# Patient Record
Sex: Female | Born: 1935
Health system: Southern US, Community
[De-identification: ages and names within clinical notes are randomized; demographics above are authoritative.]

## PROBLEM LIST (undated history)

## (undated) DIAGNOSIS — K859 Acute pancreatitis without necrosis or infection, unspecified: Secondary | ICD-10-CM

## (undated) DIAGNOSIS — I10 Essential (primary) hypertension: Secondary | ICD-10-CM

## (undated) DIAGNOSIS — M069 Rheumatoid arthritis, unspecified: Secondary | ICD-10-CM

## (undated) DIAGNOSIS — K5730 Diverticulosis of large intestine without perforation or abscess without bleeding: Secondary | ICD-10-CM

## (undated) DIAGNOSIS — R413 Other amnesia: Secondary | ICD-10-CM

## (undated) DIAGNOSIS — R198 Other specified symptoms and signs involving the digestive system and abdomen: Secondary | ICD-10-CM

## (undated) DIAGNOSIS — K222 Esophageal obstruction: Secondary | ICD-10-CM

## (undated) DIAGNOSIS — G4700 Insomnia, unspecified: Secondary | ICD-10-CM

## (undated) DIAGNOSIS — K219 Gastro-esophageal reflux disease without esophagitis: Secondary | ICD-10-CM

## (undated) DIAGNOSIS — K8020 Calculus of gallbladder without cholecystitis without obstruction: Secondary | ICD-10-CM

## (undated) DIAGNOSIS — E785 Hyperlipidemia, unspecified: Secondary | ICD-10-CM

## (undated) DIAGNOSIS — Z86010 Personal history of colonic polyps: Secondary | ICD-10-CM

## (undated) DIAGNOSIS — E538 Deficiency of other specified B group vitamins: Secondary | ICD-10-CM

## (undated) DIAGNOSIS — I2510 Atherosclerotic heart disease of native coronary artery without angina pectoris: Secondary | ICD-10-CM

## (undated) DIAGNOSIS — D649 Anemia, unspecified: Secondary | ICD-10-CM

## (undated) DIAGNOSIS — M810 Age-related osteoporosis without current pathological fracture: Secondary | ICD-10-CM

## (undated) DIAGNOSIS — K449 Diaphragmatic hernia without obstruction or gangrene: Secondary | ICD-10-CM

## (undated) DIAGNOSIS — I219 Acute myocardial infarction, unspecified: Secondary | ICD-10-CM

## (undated) HISTORY — DX: Rheumatoid arthritis, unspecified: M06.9

## (undated) HISTORY — DX: Diverticulosis of large intestine without perforation or abscess without bleeding: K57.30

## (undated) HISTORY — DX: Hyperlipidemia, unspecified: E78.5

## (undated) HISTORY — DX: Essential (primary) hypertension: I10

## (undated) HISTORY — DX: Deficiency of other specified B group vitamins: E53.8

## (undated) HISTORY — PX: CHOLECYSTECTOMY: SHX55

## (undated) HISTORY — DX: Calculus of gallbladder without cholecystitis without obstruction: K80.20

## (undated) HISTORY — DX: Esophageal obstruction: K22.2

## (undated) HISTORY — DX: Other amnesia: R41.3

## (undated) HISTORY — DX: Diaphragmatic hernia without obstruction or gangrene: K44.9

## (undated) HISTORY — DX: Anemia, unspecified: D64.9

## (undated) HISTORY — DX: Other specified symptoms and signs involving the digestive system and abdomen: R19.8

## (undated) HISTORY — DX: Age-related osteoporosis without current pathological fracture: M81.0

## (undated) HISTORY — DX: Gastro-esophageal reflux disease without esophagitis: K21.9

## (undated) HISTORY — PX: ANGIOPLASTY: SHX39

## (undated) HISTORY — PX: AXILLARY SURGERY: SHX892

## (undated) HISTORY — PX: APPENDECTOMY: SHX54

## (undated) HISTORY — DX: Acute myocardial infarction, unspecified: I21.9

## (undated) HISTORY — DX: Atherosclerotic heart disease of native coronary artery without angina pectoris: I25.10

## (undated) HISTORY — DX: Insomnia, unspecified: G47.00

## (undated) HISTORY — DX: Acute pancreatitis without necrosis or infection, unspecified: K85.90

## (undated) HISTORY — DX: Personal history of colonic polyps: Z86.010

---

## 1987-06-16 DIAGNOSIS — I219 Acute myocardial infarction, unspecified: Secondary | ICD-10-CM

## 1987-06-16 HISTORY — DX: Acute myocardial infarction, unspecified: I21.9

## 1997-11-07 ENCOUNTER — Ambulatory Visit (HOSPITAL_COMMUNITY): Admission: RE | Admit: 1997-11-07 | Discharge: 1997-11-07 | Payer: Self-pay | Admitting: Podiatry

## 1998-01-01 ENCOUNTER — Ambulatory Visit (HOSPITAL_COMMUNITY): Admission: RE | Admit: 1998-01-01 | Discharge: 1998-01-01 | Payer: Self-pay | Admitting: Podiatry

## 1999-06-11 ENCOUNTER — Emergency Department (HOSPITAL_COMMUNITY): Admission: EM | Admit: 1999-06-11 | Discharge: 1999-06-11 | Payer: Self-pay | Admitting: Emergency Medicine

## 1999-06-11 ENCOUNTER — Encounter: Payer: Self-pay | Admitting: Emergency Medicine

## 1999-06-27 ENCOUNTER — Encounter: Payer: Self-pay | Admitting: Internal Medicine

## 1999-06-27 ENCOUNTER — Ambulatory Visit (HOSPITAL_COMMUNITY): Admission: RE | Admit: 1999-06-27 | Discharge: 1999-06-27 | Payer: Self-pay | Admitting: Internal Medicine

## 2000-06-11 ENCOUNTER — Emergency Department (HOSPITAL_COMMUNITY): Admission: EM | Admit: 2000-06-11 | Discharge: 2000-06-11 | Payer: Self-pay | Admitting: Emergency Medicine

## 2000-07-02 ENCOUNTER — Other Ambulatory Visit: Admission: RE | Admit: 2000-07-02 | Discharge: 2000-07-02 | Payer: Self-pay | Admitting: Gastroenterology

## 2001-12-05 ENCOUNTER — Encounter: Payer: Self-pay | Admitting: Internal Medicine

## 2001-12-05 ENCOUNTER — Inpatient Hospital Stay (HOSPITAL_COMMUNITY): Admission: EM | Admit: 2001-12-05 | Discharge: 2001-12-08 | Payer: Self-pay | Admitting: Emergency Medicine

## 2002-03-18 ENCOUNTER — Emergency Department (HOSPITAL_COMMUNITY): Admission: EM | Admit: 2002-03-18 | Discharge: 2002-03-18 | Payer: Self-pay | Admitting: Emergency Medicine

## 2002-03-18 ENCOUNTER — Encounter: Payer: Self-pay | Admitting: Emergency Medicine

## 2002-12-30 ENCOUNTER — Emergency Department (HOSPITAL_COMMUNITY): Admission: EM | Admit: 2002-12-30 | Discharge: 2002-12-30 | Payer: Self-pay | Admitting: Emergency Medicine

## 2003-01-05 ENCOUNTER — Encounter: Payer: Self-pay | Admitting: Radiology

## 2003-01-05 ENCOUNTER — Encounter: Admission: RE | Admit: 2003-01-05 | Discharge: 2003-01-05 | Payer: Self-pay | Admitting: *Deleted

## 2003-01-05 ENCOUNTER — Encounter: Payer: Self-pay | Admitting: *Deleted

## 2003-08-28 ENCOUNTER — Encounter: Admission: RE | Admit: 2003-08-28 | Discharge: 2003-10-02 | Payer: Self-pay | Admitting: Internal Medicine

## 2003-12-19 ENCOUNTER — Inpatient Hospital Stay (HOSPITAL_COMMUNITY): Admission: EM | Admit: 2003-12-19 | Discharge: 2003-12-21 | Payer: Self-pay | Admitting: Emergency Medicine

## 2004-01-22 ENCOUNTER — Encounter: Payer: Self-pay | Admitting: Gastroenterology

## 2004-05-22 ENCOUNTER — Ambulatory Visit: Payer: Self-pay | Admitting: Internal Medicine

## 2004-06-17 ENCOUNTER — Ambulatory Visit: Payer: Self-pay | Admitting: Internal Medicine

## 2004-07-29 ENCOUNTER — Ambulatory Visit: Payer: Self-pay | Admitting: Internal Medicine

## 2004-09-23 ENCOUNTER — Ambulatory Visit: Payer: Self-pay | Admitting: Internal Medicine

## 2004-10-09 ENCOUNTER — Ambulatory Visit: Payer: Self-pay | Admitting: Internal Medicine

## 2004-11-01 ENCOUNTER — Emergency Department (HOSPITAL_COMMUNITY): Admission: EM | Admit: 2004-11-01 | Discharge: 2004-11-02 | Payer: Self-pay | Admitting: Emergency Medicine

## 2004-11-05 ENCOUNTER — Ambulatory Visit: Payer: Self-pay | Admitting: Internal Medicine

## 2004-11-06 ENCOUNTER — Encounter: Admission: RE | Admit: 2004-11-06 | Discharge: 2004-11-06 | Payer: Self-pay | Admitting: Internal Medicine

## 2004-11-06 ENCOUNTER — Ambulatory Visit: Payer: Self-pay | Admitting: Gastroenterology

## 2004-11-07 ENCOUNTER — Ambulatory Visit: Payer: Self-pay | Admitting: Internal Medicine

## 2004-11-11 ENCOUNTER — Ambulatory Visit: Payer: Self-pay | Admitting: Gastroenterology

## 2004-11-24 ENCOUNTER — Ambulatory Visit: Payer: Self-pay | Admitting: Cardiology

## 2004-11-26 ENCOUNTER — Ambulatory Visit: Payer: Self-pay | Admitting: Gastroenterology

## 2004-12-10 ENCOUNTER — Ambulatory Visit: Payer: Self-pay | Admitting: Internal Medicine

## 2004-12-10 ENCOUNTER — Ambulatory Visit: Payer: Self-pay | Admitting: Oncology

## 2005-01-26 ENCOUNTER — Ambulatory Visit: Payer: Self-pay | Admitting: Oncology

## 2005-04-10 ENCOUNTER — Ambulatory Visit: Payer: Self-pay | Admitting: Internal Medicine

## 2005-04-13 ENCOUNTER — Ambulatory Visit: Payer: Self-pay | Admitting: Oncology

## 2005-04-21 ENCOUNTER — Encounter: Admission: RE | Admit: 2005-04-21 | Discharge: 2005-04-21 | Payer: Self-pay | Admitting: General Surgery

## 2005-04-21 ENCOUNTER — Encounter (INDEPENDENT_AMBULATORY_CARE_PROVIDER_SITE_OTHER): Payer: Self-pay | Admitting: Specialist

## 2005-05-05 ENCOUNTER — Ambulatory Visit: Payer: Self-pay | Admitting: Internal Medicine

## 2005-06-03 ENCOUNTER — Encounter (INDEPENDENT_AMBULATORY_CARE_PROVIDER_SITE_OTHER): Payer: Self-pay | Admitting: *Deleted

## 2005-06-03 ENCOUNTER — Ambulatory Visit (HOSPITAL_COMMUNITY): Admission: RE | Admit: 2005-06-03 | Discharge: 2005-06-04 | Payer: Self-pay | Admitting: General Surgery

## 2005-06-09 ENCOUNTER — Ambulatory Visit: Payer: Self-pay | Admitting: Oncology

## 2005-09-08 ENCOUNTER — Ambulatory Visit: Payer: Self-pay | Admitting: Oncology

## 2005-09-14 ENCOUNTER — Ambulatory Visit: Payer: Self-pay | Admitting: Internal Medicine

## 2005-12-31 ENCOUNTER — Ambulatory Visit: Payer: Self-pay | Admitting: Oncology

## 2006-01-05 LAB — CBC WITH DIFFERENTIAL/PLATELET
BASO%: 1.2 % (ref 0.0–2.0)
Basophils Absolute: 0.1 10*3/uL (ref 0.0–0.1)
EOS%: 4.6 % (ref 0.0–7.0)
Eosinophils Absolute: 0.2 10*3/uL (ref 0.0–0.5)
HCT: 39.9 % (ref 34.8–46.6)
HGB: 13 g/dL (ref 11.6–15.9)
LYMPH%: 46 % (ref 14.0–48.0)
MCH: 29.4 pg (ref 26.0–34.0)
MCHC: 32.6 g/dL (ref 32.0–36.0)
MCV: 90.2 fL (ref 81.0–101.0)
MONO#: 0.7 10*3/uL (ref 0.1–0.9)
MONO%: 15.1 % — ABNORMAL HIGH (ref 0.0–13.0)
NEUT#: 1.6 10*3/uL (ref 1.5–6.5)
NEUT%: 33.1 % — ABNORMAL LOW (ref 39.6–76.8)
Platelets: ADEQUATE 10*3/uL (ref 145–400)
RBC: 4.42 10*6/uL (ref 3.70–5.32)
RDW: 13 % (ref 11.3–14.5)
WBC: 4.9 10*3/uL (ref 3.9–10.0)
lymph#: 2.3 10*3/uL (ref 0.9–3.3)

## 2006-01-12 ENCOUNTER — Ambulatory Visit: Payer: Self-pay | Admitting: Gastroenterology

## 2006-02-02 LAB — CBC WITH DIFFERENTIAL/PLATELET
BASO%: 0.6 % (ref 0.0–2.0)
Basophils Absolute: 0 10*3/uL (ref 0.0–0.1)
EOS%: 2.6 % (ref 0.0–7.0)
Eosinophils Absolute: 0.1 10*3/uL (ref 0.0–0.5)
HCT: 39.6 % (ref 34.8–46.6)
HGB: 13 g/dL (ref 11.6–15.9)
LYMPH%: 38.6 % (ref 14.0–48.0)
MCH: 29.2 pg (ref 26.0–34.0)
MCHC: 32.8 g/dL (ref 32.0–36.0)
MCV: 88.9 fL (ref 81.0–101.0)
MONO#: 0.6 10*3/uL (ref 0.1–0.9)
MONO%: 13.9 % — ABNORMAL HIGH (ref 0.0–13.0)
NEUT#: 2 10*3/uL (ref 1.5–6.5)
NEUT%: 44.3 % (ref 39.6–76.8)
Platelets: 179 10*3/uL (ref 145–400)
RBC: 4.46 10*6/uL (ref 3.70–5.32)
RDW: 14 % (ref 11.3–14.5)
WBC: 4.5 10*3/uL (ref 3.9–10.0)
lymph#: 1.7 10*3/uL (ref 0.9–3.3)

## 2006-02-03 ENCOUNTER — Ambulatory Visit: Payer: Self-pay | Admitting: Gastroenterology

## 2006-03-19 ENCOUNTER — Ambulatory Visit: Payer: Self-pay | Admitting: Internal Medicine

## 2006-05-14 ENCOUNTER — Ambulatory Visit: Payer: Self-pay | Admitting: Internal Medicine

## 2006-05-15 HISTORY — PX: BREAST BIOPSY: SHX20

## 2006-05-25 ENCOUNTER — Encounter: Admission: RE | Admit: 2006-05-25 | Discharge: 2006-05-25 | Payer: Self-pay | Admitting: Internal Medicine

## 2006-05-28 ENCOUNTER — Encounter: Admission: RE | Admit: 2006-05-28 | Discharge: 2006-05-28 | Payer: Self-pay | Admitting: Orthopedic Surgery

## 2006-06-11 ENCOUNTER — Encounter: Admission: RE | Admit: 2006-06-11 | Discharge: 2006-06-11 | Payer: Self-pay | Admitting: Internal Medicine

## 2006-06-11 ENCOUNTER — Encounter (INDEPENDENT_AMBULATORY_CARE_PROVIDER_SITE_OTHER): Payer: Self-pay | Admitting: Specialist

## 2006-07-14 DIAGNOSIS — I2510 Atherosclerotic heart disease of native coronary artery without angina pectoris: Secondary | ICD-10-CM

## 2006-07-14 HISTORY — DX: Atherosclerotic heart disease of native coronary artery without angina pectoris: I25.10

## 2006-08-11 ENCOUNTER — Ambulatory Visit: Payer: Self-pay | Admitting: Oncology

## 2006-08-16 LAB — CBC WITH DIFFERENTIAL/PLATELET
BASO%: 0.9 % (ref 0.0–2.0)
Basophils Absolute: 0 10*3/uL (ref 0.0–0.1)
EOS%: 2.7 % (ref 0.0–7.0)
Eosinophils Absolute: 0.1 10*3/uL (ref 0.0–0.5)
HCT: 39.4 % (ref 34.8–46.6)
HGB: 12.8 g/dL (ref 11.6–15.9)
LYMPH%: 43.3 % (ref 14.0–48.0)
MCH: 29.4 pg (ref 26.0–34.0)
MCHC: 32.5 g/dL (ref 32.0–36.0)
MCV: 90.3 fL (ref 81.0–101.0)
MONO#: 0.6 10*3/uL (ref 0.1–0.9)
MONO%: 13.3 % — ABNORMAL HIGH (ref 0.0–13.0)
NEUT#: 1.8 10*3/uL (ref 1.5–6.5)
NEUT%: 39.8 % (ref 39.6–76.8)
Platelets: 171 10*3/uL (ref 145–400)
RBC: 4.36 10*6/uL (ref 3.70–5.32)
RDW: 15 % — ABNORMAL HIGH (ref 11.3–14.5)
WBC: 4.7 10*3/uL (ref 3.9–10.0)
lymph#: 2 10*3/uL (ref 0.9–3.3)

## 2007-02-10 ENCOUNTER — Ambulatory Visit: Payer: Self-pay | Admitting: Oncology

## 2007-03-10 LAB — CBC WITH DIFFERENTIAL/PLATELET
BASO%: 1.1 % (ref 0.0–2.0)
Basophils Absolute: 0 10*3/uL (ref 0.0–0.1)
EOS%: 1.6 % (ref 0.0–7.0)
Eosinophils Absolute: 0.1 10*3/uL (ref 0.0–0.5)
HCT: 40 % (ref 34.8–46.6)
HGB: 13.4 g/dL (ref 11.6–15.9)
LYMPH%: 45.2 % (ref 14.0–48.0)
MCH: 30.7 pg (ref 26.0–34.0)
MCHC: 33.6 g/dL (ref 32.0–36.0)
MCV: 91.5 fL (ref 81.0–101.0)
MONO#: 0.6 10*3/uL (ref 0.1–0.9)
MONO%: 12.3 % (ref 0.0–13.0)
NEUT#: 1.8 10*3/uL (ref 1.5–6.5)
NEUT%: 39.8 % (ref 39.6–76.8)
Platelets: 155 10*3/uL (ref 145–400)
RBC: 4.37 10*6/uL (ref 3.70–5.32)
RDW: 13.5 % (ref 11.3–14.5)
WBC: 4.5 10*3/uL (ref 3.9–10.0)
lymph#: 2 10*3/uL (ref 0.9–3.3)

## 2007-03-24 ENCOUNTER — Ambulatory Visit: Payer: Self-pay | Admitting: Internal Medicine

## 2007-05-13 ENCOUNTER — Ambulatory Visit: Payer: Self-pay | Admitting: Internal Medicine

## 2007-05-13 ENCOUNTER — Encounter: Payer: Self-pay | Admitting: Internal Medicine

## 2007-05-13 DIAGNOSIS — Z9079 Acquired absence of other genital organ(s): Secondary | ICD-10-CM | POA: Insufficient documentation

## 2007-05-13 DIAGNOSIS — Z8719 Personal history of other diseases of the digestive system: Secondary | ICD-10-CM | POA: Insufficient documentation

## 2007-05-13 DIAGNOSIS — Z860100 Personal history of colon polyps, unspecified: Secondary | ICD-10-CM | POA: Insufficient documentation

## 2007-05-13 DIAGNOSIS — Z9189 Other specified personal risk factors, not elsewhere classified: Secondary | ICD-10-CM | POA: Insufficient documentation

## 2007-05-13 DIAGNOSIS — I10 Essential (primary) hypertension: Secondary | ICD-10-CM | POA: Insufficient documentation

## 2007-05-13 DIAGNOSIS — G4700 Insomnia, unspecified: Secondary | ICD-10-CM | POA: Insufficient documentation

## 2007-05-13 DIAGNOSIS — I2510 Atherosclerotic heart disease of native coronary artery without angina pectoris: Secondary | ICD-10-CM | POA: Insufficient documentation

## 2007-05-13 DIAGNOSIS — M069 Rheumatoid arthritis, unspecified: Secondary | ICD-10-CM | POA: Insufficient documentation

## 2007-05-13 DIAGNOSIS — E785 Hyperlipidemia, unspecified: Secondary | ICD-10-CM | POA: Insufficient documentation

## 2007-06-15 ENCOUNTER — Encounter: Admission: RE | Admit: 2007-06-15 | Discharge: 2007-06-15 | Payer: Self-pay | Admitting: Internal Medicine

## 2007-06-15 ENCOUNTER — Encounter: Payer: Self-pay | Admitting: Internal Medicine

## 2007-08-04 ENCOUNTER — Encounter: Payer: Self-pay | Admitting: Internal Medicine

## 2007-08-17 ENCOUNTER — Encounter: Payer: Self-pay | Admitting: Internal Medicine

## 2007-08-18 ENCOUNTER — Ambulatory Visit: Payer: Self-pay | Admitting: Internal Medicine

## 2007-08-24 ENCOUNTER — Telehealth: Payer: Self-pay | Admitting: Internal Medicine

## 2007-09-06 ENCOUNTER — Ambulatory Visit: Payer: Self-pay | Admitting: Oncology

## 2007-09-08 ENCOUNTER — Encounter: Payer: Self-pay | Admitting: Internal Medicine

## 2007-11-22 ENCOUNTER — Encounter: Payer: Self-pay | Admitting: Internal Medicine

## 2008-02-24 ENCOUNTER — Ambulatory Visit: Payer: Self-pay | Admitting: Oncology

## 2008-02-28 ENCOUNTER — Encounter: Payer: Self-pay | Admitting: Internal Medicine

## 2008-02-28 ENCOUNTER — Encounter: Payer: Self-pay | Admitting: Gastroenterology

## 2008-02-28 LAB — CBC WITH DIFFERENTIAL/PLATELET
BASO%: 0.6 % (ref 0.0–2.0)
Basophils Absolute: 0 10*3/uL (ref 0.0–0.1)
EOS%: 2.2 % (ref 0.0–7.0)
Eosinophils Absolute: 0.1 10*3/uL (ref 0.0–0.5)
HCT: 40.8 % (ref 34.8–46.6)
HGB: 13.2 g/dL (ref 11.6–15.9)
LYMPH%: 42 % (ref 14.0–48.0)
MCH: 29.8 pg (ref 26.0–34.0)
MCHC: 32.3 g/dL (ref 32.0–36.0)
MCV: 92.3 fL (ref 81.0–101.0)
MONO#: 0.6 10*3/uL (ref 0.1–0.9)
MONO%: 11.8 % (ref 0.0–13.0)
NEUT#: 2.1 10*3/uL (ref 1.5–6.5)
NEUT%: 43.4 % (ref 39.6–76.8)
Platelets: 153 10*3/uL (ref 145–400)
RBC: 4.42 10*6/uL (ref 3.70–5.32)
RDW: 13.8 % (ref 11.3–14.5)
WBC: 4.8 10*3/uL (ref 3.9–10.0)
lymph#: 2 10*3/uL (ref 0.9–3.3)

## 2008-03-08 ENCOUNTER — Ambulatory Visit: Payer: Self-pay | Admitting: Internal Medicine

## 2008-04-19 ENCOUNTER — Encounter: Payer: Self-pay | Admitting: Internal Medicine

## 2008-05-01 ENCOUNTER — Encounter: Payer: Self-pay | Admitting: Internal Medicine

## 2008-05-07 ENCOUNTER — Encounter: Admission: RE | Admit: 2008-05-07 | Discharge: 2008-05-07 | Payer: Self-pay | Admitting: Internal Medicine

## 2008-06-18 ENCOUNTER — Encounter: Admission: RE | Admit: 2008-06-18 | Discharge: 2008-06-18 | Payer: Self-pay | Admitting: Internal Medicine

## 2008-06-20 ENCOUNTER — Encounter: Payer: Self-pay | Admitting: Internal Medicine

## 2008-07-27 ENCOUNTER — Encounter: Payer: Self-pay | Admitting: Internal Medicine

## 2008-07-31 ENCOUNTER — Ambulatory Visit: Payer: Self-pay | Admitting: Oncology

## 2008-07-31 ENCOUNTER — Encounter: Payer: Self-pay | Admitting: Gastroenterology

## 2008-07-31 ENCOUNTER — Encounter: Payer: Self-pay | Admitting: Internal Medicine

## 2008-07-31 LAB — CBC WITH DIFFERENTIAL/PLATELET
BASO%: 0.2 % (ref 0.0–2.0)
Basophils Absolute: 0 10*3/uL (ref 0.0–0.1)
EOS%: 0.1 % (ref 0.0–7.0)
Eosinophils Absolute: 0 10*3/uL (ref 0.0–0.5)
HCT: 42 % (ref 34.8–46.6)
HGB: 13.6 g/dL (ref 11.6–15.9)
LYMPH%: 19.3 % (ref 14.0–48.0)
MCH: 30.1 pg (ref 26.0–34.0)
MCHC: 32.3 g/dL (ref 32.0–36.0)
MCV: 93.2 fL (ref 81.0–101.0)
MONO#: 0.2 10*3/uL (ref 0.1–0.9)
MONO%: 1.9 % (ref 0.0–13.0)
NEUT#: 7.3 10*3/uL — ABNORMAL HIGH (ref 1.5–6.5)
NEUT%: 78.5 % — ABNORMAL HIGH (ref 39.6–76.8)
Platelets: 178 10*3/uL (ref 145–400)
RBC: 4.51 10*6/uL (ref 3.70–5.32)
RDW: 14.7 % — ABNORMAL HIGH (ref 11.3–14.5)
WBC: 9.3 10*3/uL (ref 3.9–10.0)
lymph#: 1.8 10*3/uL (ref 0.9–3.3)

## 2008-07-31 LAB — COMPREHENSIVE METABOLIC PANEL
ALT: 40 U/L — ABNORMAL HIGH (ref 0–35)
AST: 28 U/L (ref 0–37)
Albumin: 3.7 g/dL (ref 3.5–5.2)
Alkaline Phosphatase: 35 U/L — ABNORMAL LOW (ref 39–117)
BUN: 17 mg/dL (ref 6–23)
CO2: 28 mEq/L (ref 19–32)
Calcium: 9.2 mg/dL (ref 8.4–10.5)
Chloride: 101 mEq/L (ref 96–112)
Creatinine, Ser: 0.87 mg/dL (ref 0.40–1.20)
Glucose, Bld: 104 mg/dL — ABNORMAL HIGH (ref 70–99)
Potassium: 4 mEq/L (ref 3.5–5.3)
Sodium: 136 mEq/L (ref 135–145)
Total Bilirubin: 0.7 mg/dL (ref 0.3–1.2)
Total Protein: 6.3 g/dL (ref 6.0–8.3)

## 2008-07-31 LAB — TSH: TSH: 2.305 u[IU]/mL (ref 0.350–4.500)

## 2008-07-31 LAB — HOLD TUBE, BLOOD BANK

## 2008-08-13 ENCOUNTER — Encounter: Payer: Self-pay | Admitting: Gastroenterology

## 2008-08-27 DIAGNOSIS — K222 Esophageal obstruction: Secondary | ICD-10-CM | POA: Insufficient documentation

## 2008-08-27 DIAGNOSIS — K5730 Diverticulosis of large intestine without perforation or abscess without bleeding: Secondary | ICD-10-CM | POA: Insufficient documentation

## 2008-08-27 DIAGNOSIS — I219 Acute myocardial infarction, unspecified: Secondary | ICD-10-CM | POA: Insufficient documentation

## 2008-08-27 DIAGNOSIS — K449 Diaphragmatic hernia without obstruction or gangrene: Secondary | ICD-10-CM | POA: Insufficient documentation

## 2008-08-27 DIAGNOSIS — D509 Iron deficiency anemia, unspecified: Secondary | ICD-10-CM | POA: Insufficient documentation

## 2008-08-27 DIAGNOSIS — K219 Gastro-esophageal reflux disease without esophagitis: Secondary | ICD-10-CM | POA: Insufficient documentation

## 2008-08-27 DIAGNOSIS — R198 Other specified symptoms and signs involving the digestive system and abdomen: Secondary | ICD-10-CM | POA: Insufficient documentation

## 2008-08-27 HISTORY — DX: Acute myocardial infarction, unspecified: I21.9

## 2008-08-28 ENCOUNTER — Ambulatory Visit: Payer: Self-pay | Admitting: Gastroenterology

## 2008-08-28 DIAGNOSIS — K8020 Calculus of gallbladder without cholecystitis without obstruction: Secondary | ICD-10-CM | POA: Insufficient documentation

## 2008-08-28 DIAGNOSIS — R413 Other amnesia: Secondary | ICD-10-CM | POA: Insufficient documentation

## 2008-09-03 ENCOUNTER — Encounter: Payer: Self-pay | Admitting: Gastroenterology

## 2008-09-03 ENCOUNTER — Ambulatory Visit: Payer: Self-pay | Admitting: Gastroenterology

## 2008-09-04 LAB — CONVERTED CEMR LAB
ALT: 23 units/L (ref 0–35)
AST: 23 units/L (ref 0–37)
Albumin: 3.6 g/dL (ref 3.5–5.2)
Alkaline Phosphatase: 40 units/L (ref 39–117)
BUN: 12 mg/dL (ref 6–23)
Basophils Absolute: 0 10*3/uL (ref 0.0–0.1)
Basophils Relative: 0.4 % (ref 0.0–3.0)
Bilirubin, Direct: 0.1 mg/dL (ref 0.0–0.3)
CO2: 31 meq/L (ref 19–32)
Calcium: 9.1 mg/dL (ref 8.4–10.5)
Chloride: 105 meq/L (ref 96–112)
Creatinine, Ser: 0.8 mg/dL (ref 0.4–1.2)
Eosinophils Absolute: 0 10*3/uL (ref 0.0–0.7)
Eosinophils Relative: 0.3 % (ref 0.0–5.0)
Ferritin: 726.7 ng/mL — ABNORMAL HIGH (ref 10.0–291.0)
Folate: 11.1 ng/mL
GFR calc non Af Amer: 88.74 mL/min (ref >60)
Glucose, Bld: 89 mg/dL (ref 70–99)
HCT: 39.1 % (ref 36.0–46.0)
Hemoglobin: 13.1 g/dL (ref 12.0–15.0)
Iron: 41 ug/dL — ABNORMAL LOW (ref 42–145)
Lymphocytes Relative: 28.4 % (ref 12.0–46.0)
Lymphs Abs: 2 10*3/uL (ref 0.7–4.0)
MCHC: 33.4 g/dL (ref 30.0–36.0)
MCV: 93.1 fL (ref 78.0–100.0)
Monocytes Absolute: 0.6 10*3/uL (ref 0.1–1.0)
Monocytes Relative: 8.3 % (ref 3.0–12.0)
Neutro Abs: 4.4 10*3/uL (ref 1.4–7.7)
Neutrophils Relative %: 62.6 % (ref 43.0–77.0)
Platelets: 154 10*3/uL (ref 150.0–400.0)
Potassium: 4.2 meq/L (ref 3.5–5.1)
RBC: 4.2 M/uL (ref 3.87–5.11)
RDW: 14 % (ref 11.5–14.6)
Saturation Ratios: 12 % — ABNORMAL LOW (ref 20.0–50.0)
Sed Rate: 14 mm/hr (ref 0–22)
Sodium: 144 meq/L (ref 135–145)
TSH: 0.97 microintl units/mL (ref 0.35–5.50)
Total Bilirubin: 0.7 mg/dL (ref 0.3–1.2)
Total Protein: 6.8 g/dL (ref 6.0–8.3)
Transferrin: 243.4 mg/dL (ref 212.0–360.0)
Vitamin B-12: 261 pg/mL (ref 211–911)
WBC: 7 10*3/uL (ref 4.5–10.5)

## 2008-09-05 ENCOUNTER — Ambulatory Visit: Payer: Self-pay | Admitting: Gastroenterology

## 2008-09-05 ENCOUNTER — Ambulatory Visit (HOSPITAL_COMMUNITY): Admission: RE | Admit: 2008-09-05 | Discharge: 2008-09-05 | Payer: Self-pay | Admitting: Gastroenterology

## 2008-09-12 ENCOUNTER — Ambulatory Visit: Payer: Self-pay | Admitting: Gastroenterology

## 2008-09-19 ENCOUNTER — Ambulatory Visit: Payer: Self-pay | Admitting: Gastroenterology

## 2008-10-08 ENCOUNTER — Encounter: Payer: Self-pay | Admitting: Internal Medicine

## 2008-10-16 ENCOUNTER — Ambulatory Visit: Payer: Self-pay | Admitting: Internal Medicine

## 2008-10-16 LAB — CONVERTED CEMR LAB
ALT: 15 units/L (ref 0–35)
AST: 20 units/L (ref 0–37)
Albumin: 3.6 g/dL (ref 3.5–5.2)
Alkaline Phosphatase: 38 units/L — ABNORMAL LOW (ref 39–117)
Bilirubin, Direct: 0.1 mg/dL (ref 0.0–0.3)
Cholesterol: 236 mg/dL — ABNORMAL HIGH (ref 0–200)
Direct LDL: 131.1 mg/dL
HDL: 53.1 mg/dL (ref >39.00)
Total Bilirubin: 0.7 mg/dL (ref 0.3–1.2)
Total CHOL/HDL Ratio: 4
Total Protein: 7.2 g/dL (ref 6.0–8.3)
Triglycerides: 340 mg/dL — ABNORMAL HIGH (ref 0.0–149.0)
VLDL: 68 mg/dL — ABNORMAL HIGH (ref 0.0–40.0)

## 2008-10-19 ENCOUNTER — Ambulatory Visit: Payer: Self-pay | Admitting: Gastroenterology

## 2008-10-21 ENCOUNTER — Encounter: Payer: Self-pay | Admitting: Internal Medicine

## 2008-10-24 ENCOUNTER — Ambulatory Visit: Payer: Self-pay | Admitting: Oncology

## 2008-10-26 ENCOUNTER — Encounter: Payer: Self-pay | Admitting: Internal Medicine

## 2008-10-26 LAB — CBC WITH DIFFERENTIAL/PLATELET
BASO%: 0.8 % (ref 0.0–2.0)
Basophils Absolute: 0 10*3/uL (ref 0.0–0.1)
EOS%: 0.5 % (ref 0.0–7.0)
Eosinophils Absolute: 0 10*3/uL (ref 0.0–0.5)
HCT: 40 % (ref 34.8–46.6)
HGB: 12.8 g/dL (ref 11.6–15.9)
LYMPH%: 39.4 % (ref 14.0–49.7)
MCH: 29.8 pg (ref 25.1–34.0)
MCHC: 32 g/dL (ref 31.5–36.0)
MCV: 92.8 fL (ref 79.5–101.0)
MONO#: 0.5 10*3/uL (ref 0.1–0.9)
MONO%: 8.2 % (ref 0.0–14.0)
NEUT#: 3 10*3/uL (ref 1.5–6.5)
NEUT%: 51.1 % (ref 38.4–76.8)
Platelets: 165 10*3/uL (ref 145–400)
RBC: 4.31 10*6/uL (ref 3.70–5.45)
RDW: 13.9 % (ref 11.2–14.5)
WBC: 5.8 10*3/uL (ref 3.9–10.3)
lymph#: 2.3 10*3/uL (ref 0.9–3.3)

## 2008-11-23 ENCOUNTER — Ambulatory Visit: Payer: Self-pay | Admitting: Gastroenterology

## 2008-12-24 ENCOUNTER — Ambulatory Visit: Payer: Self-pay | Admitting: Gastroenterology

## 2009-01-05 ENCOUNTER — Emergency Department (HOSPITAL_COMMUNITY): Admission: EM | Admit: 2009-01-05 | Discharge: 2009-01-05 | Payer: Self-pay | Admitting: Emergency Medicine

## 2009-01-10 ENCOUNTER — Encounter: Payer: Self-pay | Admitting: Internal Medicine

## 2009-01-25 ENCOUNTER — Ambulatory Visit: Payer: Self-pay | Admitting: Gastroenterology

## 2009-02-25 ENCOUNTER — Ambulatory Visit: Payer: Self-pay | Admitting: Gastroenterology

## 2009-02-25 DIAGNOSIS — E538 Deficiency of other specified B group vitamins: Secondary | ICD-10-CM | POA: Insufficient documentation

## 2009-03-01 ENCOUNTER — Emergency Department (HOSPITAL_COMMUNITY): Admission: EM | Admit: 2009-03-01 | Discharge: 2009-03-02 | Payer: Self-pay | Admitting: Emergency Medicine

## 2009-03-06 ENCOUNTER — Encounter: Payer: Self-pay | Admitting: Internal Medicine

## 2009-03-28 ENCOUNTER — Ambulatory Visit: Payer: Self-pay | Admitting: Gastroenterology

## 2009-04-29 ENCOUNTER — Ambulatory Visit: Payer: Self-pay | Admitting: Gastroenterology

## 2009-05-27 ENCOUNTER — Ambulatory Visit: Payer: Self-pay | Admitting: Gastroenterology

## 2009-06-19 ENCOUNTER — Encounter: Admission: RE | Admit: 2009-06-19 | Discharge: 2009-06-19 | Payer: Self-pay | Admitting: Internal Medicine

## 2009-07-01 ENCOUNTER — Ambulatory Visit: Payer: Self-pay | Admitting: Gastroenterology

## 2009-07-04 ENCOUNTER — Encounter: Payer: Self-pay | Admitting: Internal Medicine

## 2009-07-26 ENCOUNTER — Ambulatory Visit: Payer: Self-pay | Admitting: Oncology

## 2009-07-30 ENCOUNTER — Encounter: Payer: Self-pay | Admitting: Internal Medicine

## 2009-07-30 LAB — CBC WITH DIFFERENTIAL/PLATELET
BASO%: 0.3 % (ref 0.0–2.0)
Basophils Absolute: 0 10*3/uL (ref 0.0–0.1)
EOS%: 2 % (ref 0.0–7.0)
Eosinophils Absolute: 0.1 10*3/uL (ref 0.0–0.5)
HCT: 37.3 % (ref 34.8–46.6)
HGB: 11.9 g/dL (ref 11.6–15.9)
LYMPH%: 28.7 % (ref 14.0–49.7)
MCH: 30.2 pg (ref 25.1–34.0)
MCHC: 32 g/dL (ref 31.5–36.0)
MCV: 94.4 fL (ref 79.5–101.0)
MONO#: 0.5 10*3/uL (ref 0.1–0.9)
MONO%: 7.3 % (ref 0.0–14.0)
NEUT#: 4 10*3/uL (ref 1.5–6.5)
NEUT%: 61.7 % (ref 38.4–76.8)
Platelets: 189 10*3/uL (ref 145–400)
RBC: 3.95 10*6/uL (ref 3.70–5.45)
RDW: 15.4 % — ABNORMAL HIGH (ref 11.2–14.5)
WBC: 6.5 10*3/uL (ref 3.9–10.3)
lymph#: 1.9 10*3/uL (ref 0.9–3.3)

## 2009-08-05 ENCOUNTER — Ambulatory Visit: Payer: Self-pay | Admitting: Gastroenterology

## 2009-08-19 ENCOUNTER — Encounter: Payer: Self-pay | Admitting: Internal Medicine

## 2009-09-02 ENCOUNTER — Ambulatory Visit: Payer: Self-pay | Admitting: Gastroenterology

## 2009-10-07 ENCOUNTER — Ambulatory Visit: Payer: Self-pay | Admitting: Gastroenterology

## 2009-10-08 ENCOUNTER — Ambulatory Visit: Payer: Self-pay | Admitting: Internal Medicine

## 2009-10-09 DIAGNOSIS — M810 Age-related osteoporosis without current pathological fracture: Secondary | ICD-10-CM | POA: Insufficient documentation

## 2009-10-30 ENCOUNTER — Encounter: Payer: Self-pay | Admitting: Internal Medicine

## 2009-11-06 ENCOUNTER — Ambulatory Visit: Payer: Self-pay | Admitting: Gastroenterology

## 2009-12-04 ENCOUNTER — Ambulatory Visit: Payer: Self-pay | Admitting: Gastroenterology

## 2009-12-26 ENCOUNTER — Encounter: Payer: Self-pay | Admitting: Internal Medicine

## 2010-01-03 ENCOUNTER — Ambulatory Visit: Payer: Self-pay | Admitting: Internal Medicine

## 2010-01-03 ENCOUNTER — Ambulatory Visit: Payer: Self-pay | Admitting: Gastroenterology

## 2010-01-03 LAB — CONVERTED CEMR LAB
Basophils Absolute: 0 10*3/uL (ref 0.0–0.1)
Basophils Relative: 0.8 % (ref 0.0–3.0)
Eosinophils Absolute: 0.1 10*3/uL (ref 0.0–0.7)
Eosinophils Relative: 2.2 % (ref 0.0–5.0)
Folate: 3.8 ng/mL
HCT: 37.6 % (ref 36.0–46.0)
Hemoglobin: 12.3 g/dL (ref 12.0–15.0)
Iron: 96 ug/dL (ref 42–145)
Lymphocytes Relative: 34.1 % (ref 12.0–46.0)
Lymphs Abs: 1.7 10*3/uL (ref 0.7–4.0)
MCHC: 32.7 g/dL (ref 30.0–36.0)
MCV: 90.8 fL (ref 78.0–100.0)
Monocytes Absolute: 0.7 10*3/uL (ref 0.1–1.0)
Monocytes Relative: 14 % — ABNORMAL HIGH (ref 3.0–12.0)
Neutro Abs: 2.5 10*3/uL (ref 1.4–7.7)
Neutrophils Relative %: 48.9 % (ref 43.0–77.0)
Platelets: 138 10*3/uL — ABNORMAL LOW (ref 150.0–400.0)
RBC: 4.15 M/uL (ref 3.87–5.11)
RDW: 14.8 % — ABNORMAL HIGH (ref 11.5–14.6)
Retic Ct Pct: 1.2 % (ref 0.4–3.1)
Saturation Ratios: 33 % (ref 20.0–50.0)
Transferrin: 207.7 mg/dL — ABNORMAL LOW (ref 212.0–360.0)
Vitamin B-12: >1500 pg/mL — ABNORMAL HIGH (ref 211–911)
WBC: 5.1 10*3/uL (ref 4.5–10.5)

## 2010-01-05 ENCOUNTER — Encounter: Payer: Self-pay | Admitting: Internal Medicine

## 2010-01-07 ENCOUNTER — Ambulatory Visit: Payer: Self-pay | Admitting: Oncology

## 2010-01-07 LAB — CBC WITH DIFFERENTIAL/PLATELET
BASO%: 0.9 % (ref 0.0–2.0)
Basophils Absolute: 0 10*3/uL (ref 0.0–0.1)
EOS%: 2.9 % (ref 0.0–7.0)
Eosinophils Absolute: 0.1 10*3/uL (ref 0.0–0.5)
HCT: 38.6 % (ref 34.8–46.6)
HGB: 12.1 g/dL (ref 11.6–15.9)
LYMPH%: 44.4 % (ref 14.0–49.7)
MCH: 28.8 pg (ref 25.1–34.0)
MCHC: 31.4 g/dL — ABNORMAL LOW (ref 31.5–36.0)
MCV: 91.5 fL (ref 79.5–101.0)
MONO#: 0.3 10*3/uL (ref 0.1–0.9)
MONO%: 10.1 % (ref 0.0–14.0)
NEUT#: 1.4 10*3/uL — ABNORMAL LOW (ref 1.5–6.5)
NEUT%: 41.7 % (ref 38.4–76.8)
Platelets: 165 10*3/uL (ref 145–400)
RBC: 4.22 10*6/uL (ref 3.70–5.45)
RDW: 14.4 % (ref 11.2–14.5)
WBC: 3.4 10*3/uL — ABNORMAL LOW (ref 3.9–10.3)
lymph#: 1.5 10*3/uL (ref 0.9–3.3)

## 2010-01-07 LAB — CHCC SMEAR

## 2010-01-07 LAB — FERRITIN: Ferritin: 970 ng/mL — ABNORMAL HIGH (ref 10–291)

## 2010-02-04 ENCOUNTER — Ambulatory Visit: Payer: Self-pay | Admitting: Gastroenterology

## 2010-02-04 ENCOUNTER — Encounter (INDEPENDENT_AMBULATORY_CARE_PROVIDER_SITE_OTHER): Payer: Self-pay | Admitting: *Deleted

## 2010-02-06 ENCOUNTER — Ambulatory Visit: Payer: Self-pay | Admitting: Gastroenterology

## 2010-02-07 LAB — CONVERTED CEMR LAB: Fecal Occult Bld: POSITIVE

## 2010-02-13 ENCOUNTER — Encounter (INDEPENDENT_AMBULATORY_CARE_PROVIDER_SITE_OTHER): Payer: Self-pay | Admitting: *Deleted

## 2010-02-14 ENCOUNTER — Ambulatory Visit: Payer: Self-pay | Admitting: Gastroenterology

## 2010-02-26 ENCOUNTER — Ambulatory Visit: Payer: Self-pay | Admitting: Internal Medicine

## 2010-02-28 ENCOUNTER — Ambulatory Visit: Payer: Self-pay | Admitting: Gastroenterology

## 2010-03-03 DIAGNOSIS — Z860100 Personal history of colon polyps, unspecified: Secondary | ICD-10-CM

## 2010-03-03 HISTORY — DX: Personal history of colonic polyps: Z86.010

## 2010-03-03 HISTORY — DX: Personal history of colon polyps, unspecified: Z86.0100

## 2010-03-04 ENCOUNTER — Encounter: Payer: Self-pay | Admitting: Gastroenterology

## 2010-03-06 ENCOUNTER — Ambulatory Visit: Payer: Self-pay | Admitting: Gastroenterology

## 2010-04-07 ENCOUNTER — Ambulatory Visit: Payer: Self-pay | Admitting: Gastroenterology

## 2010-04-24 ENCOUNTER — Ambulatory Visit: Payer: Self-pay | Admitting: Oncology

## 2010-04-28 ENCOUNTER — Encounter: Payer: Self-pay | Admitting: Gastroenterology

## 2010-04-28 LAB — CBC WITH DIFFERENTIAL/PLATELET
BASO%: 0.8 % (ref 0.0–2.0)
Basophils Absolute: 0.1 10*3/uL (ref 0.0–0.1)
EOS%: 0.4 % (ref 0.0–7.0)
Eosinophils Absolute: 0 10*3/uL (ref 0.0–0.5)
HCT: 39.7 % (ref 34.8–46.6)
HGB: 12.9 g/dL (ref 11.6–15.9)
LYMPH%: 22 % (ref 14.0–49.7)
MCH: 30.2 pg (ref 25.1–34.0)
MCHC: 32.6 g/dL (ref 31.5–36.0)
MCV: 92.7 fL (ref 79.5–101.0)
MONO#: 0.7 10*3/uL (ref 0.1–0.9)
MONO%: 6.6 % (ref 0.0–14.0)
NEUT#: 7.1 10*3/uL — ABNORMAL HIGH (ref 1.5–6.5)
NEUT%: 70.2 % (ref 38.4–76.8)
Platelets: 162 10*3/uL (ref 145–400)
RBC: 4.28 10*6/uL (ref 3.70–5.45)
RDW: 14 % (ref 11.2–14.5)
WBC: 10.2 10*3/uL (ref 3.9–10.3)
lymph#: 2.2 10*3/uL (ref 0.9–3.3)

## 2010-05-05 ENCOUNTER — Ambulatory Visit: Payer: Self-pay | Admitting: Gastroenterology

## 2010-06-02 ENCOUNTER — Ambulatory Visit: Payer: Self-pay | Admitting: Gastroenterology

## 2010-06-23 ENCOUNTER — Encounter
Admission: RE | Admit: 2010-06-23 | Discharge: 2010-06-23 | Payer: Self-pay | Source: Home / Self Care | Attending: Internal Medicine | Admitting: Internal Medicine

## 2010-06-26 ENCOUNTER — Encounter: Payer: Self-pay | Admitting: Internal Medicine

## 2010-06-30 ENCOUNTER — Ambulatory Visit
Admission: RE | Admit: 2010-06-30 | Discharge: 2010-06-30 | Payer: Self-pay | Source: Home / Self Care | Attending: Gastroenterology | Admitting: Gastroenterology

## 2010-07-15 NOTE — Letter (Signed)
Summary: Patient Notice-Endo Biopsy Results  [facility]  [address] [place], [place] [address]   Phone: [phone]  Fax: [phone]        September 05, 2008 MRN: [id number]    Ashley Willis [address] [place], [place]  [address]    Dear Ms. HALTIWANGER,  I am pleased to inform you that the biopsies taken during your recent endoscopic examination did not show any evidence of cancer upon pathologic examination.  Additional information/recommendations:  __No further action is needed at this time.  Please follow-up with      your primary care physician for your other healthcare needs.  __ Please call [phone] to schedule a return visit to review      your condition.  _xx_ Continue with the treatment plan as outlined on the day of your      exam.  __ You should have a repeat endoscopic examination for this problem              in _ months/years.   Please call us if you are having persistent problems or have questions about your condition that have not been fully answered at this time.  Sincerely,  Mardella Layman MD [facility]  This letter has been electronically signed by your physician.

## 2010-07-15 NOTE — Assessment & Plan Note (Signed)
Summary: NOT FEELING WELL/NML   Vital Signs:  Patient Profile:   77 Years Old Female Weight:      128 pounds Temp:     98.0 degrees F Pulse rate:   63 / minute BP sitting:   119 / 58  (right arm) Cuff size:   regular  Vitals Entered By: [email or web address]                 PCP:  Norins  Chief Complaint:  Needs refills on meds.  History of Present Illness: feels good. Needs Rx refilled.    Current Allergies: ! CODEINE        Complete Medication List: 1)  Cardizem Cd 180 Mg Cp24 (Diltiazem hcl coated beads) .Marland Kitchen.. 1 by mouth once daily 2)  Aspirin 325 Mg Tabs (Aspirin) .Marland Kitchen.. 1 by mouth once daily 3)  Vitamin E 400 Unit Caps (Vitamin e) .Marland Kitchen.. 1 by mouth once daily 4)  Zocor 80 Mg Tabs (Simvastatin) .Marland Kitchen.. 1 by mouth once daily 5)  Protonix 40 Mg Tbec (Pantoprazole sodium) .Marland Kitchen.. 1 by mouth once daily 6)  Atenolol 50 Mg Tabs (Atenolol) .Marland Kitchen.. 1 by mouth once daily 7)  Diovan 80 Mg Tabs (Valsartan) .Marland Kitchen.. 1 by mouth once daily 8)  Vitamin C 500 Mg Tabs (Ascorbic acid) .... 2 once daily 9)  B-100 Complex Tabs (Vitamins-lipotropics) .Marland Kitchen.. 1 by mouth once daily 10)  Iron 325 (65 Fe) Mg Tabs (Ferrous sulfate) .Marland Kitchen.. 1 by mouth once daily 11)  Actonel 35 Mg Tabs (Risedronate sodium) .... Weekly 12)  Caltrate 600+d 600-400 Mg-unit Tabs (Calcium carbonate-vitamin d) .Marland Kitchen.. 1 by mouth once daily 13)  Imodium A-d 2 Mg Tabs (Loperamide hcl) .... As needed 14)  Temazepam 15 Mg Caps (Temazepam) .... At bedtime as needed     Prescriptions: ACTONEL 35 MG  TABS (RISEDRONATE SODIUM) weekly  #12 x 3   Entered and Authorized by:   Jacques Navy MD   Signed by:   Jacques Navy MD on 08/18/2007   Method used:   Print then Give to Patient   RxID:   [id number] DIOVAN 80 MG  TABS (VALSARTAN) 1 by mouth once daily  #90 x 3   Entered and Authorized by:   Jacques Navy MD   Signed by:   Jacques Navy MD on 08/18/2007   Method used:   Print then Give to Patient   RxID:    [id number] ATENOLOL 50 MG  TABS (ATENOLOL) 1 by mouth once daily  #90 x 3   Entered and Authorized by:   Jacques Navy MD   Signed by:   Jacques Navy MD on 08/18/2007   Method used:   Print then Give to Patient   RxID:   [id number] PROTONIX 40 MG  TBEC (PANTOPRAZOLE SODIUM) 1 by mouth once daily  #90 x 3   Entered and Authorized by:   Jacques Navy MD   Signed by:   Jacques Navy MD on 08/18/2007   Method used:   Print then Give to Patient   RxID:   [id number] ALTACE 10 MG  TABS (RAMIPRIL) 1 by mouth once daily  #90 x 3   Entered and Authorized by:   Jacques Navy MD   Signed by:   Jacques Navy MD on 08/18/2007   Method used:   Print then Give to Patient   RxID:   [id number] ZOCOR 80 MG  TABS (SIMVASTATIN) 1 by mouth once daily  #90  x 3   Entered and Authorized by:   Jacques Navy MD   Signed by:   Jacques Navy MD on 08/18/2007   Method used:   Print then Give to Patient   RxID:   [id number] CARDIZEM CD 180 MG  CP24 (DILTIAZEM HCL COATED BEADS) 1 by mouth once daily  #90 x 3   Entered and Authorized by:   Jacques Navy MD   Signed by:   Jacques Navy MD on 08/18/2007   Method used:   Print then Give to Patient   RxID:   [id number]  ]

## 2010-07-15 NOTE — Letter (Signed)
Summary: [facility] Instructions  [facility]  [address] [place], [place] [address]   Phone: [phone]  Fax: [phone]       Ashley Willis    30-Jun-1926    MRN: [id number]        Procedure Day /Date:  Friday 02/28/2010     Arrival Time: 1:30 pm      Procedure Time: 2:30 pm     Location of Procedure:                    _ x_  [facility] (4th Floor)                        PREPARATION FOR COLONOSCOPY WITH MOVIPREP   Starting 5 days prior to your procedure  Sunday 9/11 do not eat nuts, seeds, popcorn, corn, beans, peas,  salads, or any raw vegetables.  Do not take any fiber supplements (e.g. Metamucil, Citrucel, and Benefiber).  THE DAY BEFORE YOUR PROCEDURE         DATE: Thursday 9/15  1.  Drink clear liquids the entire day-NO SOLID FOOD  2.  Do not drink anything colored red or purple.  Avoid juices with pulp.  No orange juice.  3.  Drink at least 64 oz. (8 glasses) of fluid/clear liquids during the day to prevent dehydration and help the prep work efficiently.  CLEAR LIQUIDS INCLUDE: Water Jello Ice Popsicles Tea (sugar ok, no milk/cream) Powdered fruit flavored drinks Coffee (sugar ok, no milk/cream) Gatorade Juice: apple, white grape, white cranberry  Lemonade Clear bullion, consomm, broth Carbonated beverages (any kind) Strained chicken noodle soup Hard Candy                             4.  In the morning, mix first dose of MoviPrep solution:    Empty 1 Pouch A and 1 Pouch B into the disposable container    Add lukewarm drinking water to the top line of the container. Mix to dissolve    Refrigerate (mixed solution should be used within 24 hrs)  5.  Begin drinking the prep at 5:00 p.m. The MoviPrep container is divided by 4 marks.   Every 15 minutes drink the solution down to the next mark (approximately 8 oz) until the full liter is complete.   6.  Follow completed prep with 16 oz of clear liquid of your choice (Nothing  red or purple).  Continue to drink clear liquids until bedtime.  7.  Before going to bed, mix second dose of MoviPrep solution:    Empty 1 Pouch A and 1 Pouch B into the disposable container    Add lukewarm drinking water to the top line of the container. Mix to dissolve    Refrigerate  THE DAY OF YOUR PROCEDURE      DATE: Friday 9/16  Beginning at 9:30 a.m. (5 hours before procedure):         1. Every 15 minutes, drink the solution down to the next mark (approx 8 oz) until the full liter is complete.  2. Follow completed prep with 16 oz. of clear liquid of your choice.    3. You may drink clear liquids until 12:30 pm (2 HOURS BEFORE PROCEDURE).   MEDICATION INSTRUCTIONS  Unless otherwise instructed, you should take regular prescription medications with a small sip of water   as early as possible the morning  of your procedure.  Additional medication instructions: Do not take Iron 5 days before procedure.         OTHER INSTRUCTIONS  You will need a responsible adult at least 75 years of age to accompany you and drive you home.   This person must remain in the waiting room during your procedure.  Wear loose fitting clothing that is easily removed.  Leave jewelry and other valuables at home.  However, you may wish to bring a book to read or  an iPod/MP3 player to listen to music as you wait for your procedure to start.  Remove all body piercing jewelry and leave at home.  Total time from sign-in until discharge is approximately 2-3 hours.  You should go home directly after your procedure and rest.  You can resume normal activities the  day after your procedure.  The day of your procedure you should not:   Drive   Make legal decisions   Operate machinery   Drink alcohol   Return to work  You will receive specific instructions about eating, activities and medications before you leave.    The above instructions have been reviewed and explained to me by    Ezra Sites RN  February 14, 2010 10:18 AM     I fully understand and can verbalize these instructions _____________________________ Date _________

## 2010-07-15 NOTE — Letter (Signed)
Summary: [facility]   Imported By: Sherian Rein 03/11/2010 13:02:17  _____________________________________________________________________  External Attachment:    Type:   Image     Comment:   External Document

## 2010-07-15 NOTE — Assessment & Plan Note (Signed)
Summary: B12 SHOT..AM.  Nurse Visit   Allergies: 1)  ! Codeine 2)  ! Asa  Medication Administration  Injection # 1:    Medication: Vit B12 1000 mcg    Diagnosis: VITAMIN B12 DEFICIENCY (ICD-266.2)    Route: IM    Site: L deltoid    Exp Date: 03/16/2011    Lot #: [id number]    Mfr: [employer]    Patient tolerated injection without complications    Given by: Christie Nottingham CMA (AAMA) (July 01, 2009 10:03 AM)  Orders Added: 1)  Vit B12 1000 mcg [J3420]

## 2010-07-15 NOTE — Assessment & Plan Note (Signed)
Summary: FU-STC   Vital Signs:  Patient Profile:   77 Years Old Female Weight:      127.5 pounds Temp:     98.2 degrees F oral Pulse rate:   61 / minute BP sitting:   111 / 59  (left arm) Cuff size:   regular  Vitals Entered By: Zackery Barefoot CMA (May 13, 2007 11:07 AM)                 PCP:  Norins  Chief Complaint:  Diarrhea and sleep problems.  History of Present Illness: Patient c/o insomnia: cannot fall asleep. Has not tried any OTC products. Watches TV and does puzzles.  Diarrhea: 2-3 loose stools per day. Mix stools with some formed components. Responds to immodium.  Current Allergies (reviewed today): ! CODEINE  Past Medical History:    Reviewed history from 05/13/2007 and no changes required:       Colonic polyps, hx of       Coronary artery disease       Hyperlipidemia       Hypertension       Pancreatitis, hx of       Rheumatoid arthritis                     Physician Roster:             Card Tresa Endo             Hematology - Truett Perna             Rheum - Zimenski             GS Derrell Lolling  Past Surgical History:    Reviewed history from 05/13/2007 and no changes required:       Appendectomy       Percutaneous transluminal coronary angioplasty       TAH/BSO, HX OF (ICD-V45.77)       POLYPECTOMY, HX OF (ICD-V15.9)   Family History:    father- died 11's;    mother - died 23's; DM    1 sister- healthy    6 brothers- oldest CVA, two died unknown cause; 3 living, one with CVA  Social History:    married 1948    1 daughter 65; 3 sons 20, 20, 71    9 grandchildrens; 6 great-grandchildren    marriage in good health   Risk Factors:  Alcohol use:  no Exercise:  no Seatbelt use:  100 %    Physical Exam  General:     Well-developed,well-nourished,in no acute distress; alert,appropriate and cooperative throughout examination Head:     Normocephalic and atraumatic without obvious abnormalities. No apparent alopecia or  balding. Eyes:     No corneal or conjunctival inflammation noted. EOMI. Perrla. Funduscopic exam benign, without hemorrhages, exudates or papilledema. Vision grossly normal. Lungs:     Normal respiratory effort, chest expands symmetrically. Lungs are clear to auscultation, no crackles or wheezes. Heart:     Normal rate and regular rhythm. S1 and S2 normal without gallop, murmur, click, rub or other extra sounds. Neurologic:     alert & oriented X3 and cranial nerves II-XII intact.   Psych:     Oriented X3 and memory intact for recent and remote.      Impression & Recommendations:  Problem # 1:  INSOMNIA (ICD-780.52) will give instructions for sleep hygiene Start Temazepam 15 mg at bedtime. Her updated medication list for this problem includes:    Temazepam  15 Mg Caps (Temazepam) .Marland Kitchen... At bedtime as needed   Problem # 2:  DIARRHEA (ICD-787.91)  Her updated medication list for this problem includes:    Imodium A-d 2 Mg Tabs (Loperamide hcl) .Marland Kitchen... As needed mild symptoms.  Plan: bulk laxative and continue immodium as needed.  Complete Medication List: 1)  Cardizem Cd 180 Mg Cp24 (Diltiazem hcl coated beads) .Marland Kitchen.. 1 by mouth once daily 2)  Aspirin 325 Mg Tabs (Aspirin) .Marland Kitchen.. 1 by mouth once daily 3)  Vitamin E 400 Unit Caps (Vitamin e) .Marland Kitchen.. 1 by mouth once daily 4)  Zocor 80 Mg Tabs (Simvastatin) .Marland Kitchen.. 1 by mouth once daily 5)  Altace 10 Mg Tabs (Ramipril) .Marland Kitchen.. 1 by mouth once daily 6)  Protonix 40 Mg Tbec (Pantoprazole sodium) .Marland Kitchen.. 1 by mouth once daily 7)  Atenolol 50 Mg Tabs (Atenolol) .Marland Kitchen.. 1 by mouth once daily 8)  Diovan 80 Mg Tabs (Valsartan) .Marland Kitchen.. 1 by mouth once daily 9)  Vitamin C 500 Mg Tabs (Ascorbic acid) .... 2 once daily 10)  B-100 Complex Tabs (Vitamins-lipotropics) .Marland Kitchen.. 1 by mouth once daily 11)  Iron 325 (65 Fe) Mg Tabs (Ferrous sulfate) .Marland Kitchen.. 1 by mouth once daily 12)  Actonel 35 Mg Tabs (Risedronate sodium) .... Weekly 13)  Caltrate 600+d 600-400 Mg-unit Tabs  (Calcium carbonate-vitamin d) .Marland Kitchen.. 1 by mouth once daily 14)  Imodium A-d 2 Mg Tabs (Loperamide hcl) .... As needed 15)  Temazepam 15 Mg Caps (Temazepam) .... At bedtime as needed   Patient Instructions: 1)  Please schedule a follow-up appointment as needed. 2)  Insomnia: tips for  better sleeping: rgular hours for sleep and rising; no TV watcing or puzzles in bed, this should be done in another room. If you can't fall to sleep get up and go to another room until drowsy and able to sleep. No sleeping late! No naps. 3)  Diarrhea - try taking a bulk laxative like metamucil or benefiber to help regulate bowels. If needed it is OK to take immodium.    Prescriptions: TEMAZEPAM 15 MG  CAPS (TEMAZEPAM) at bedtime as needed  #30 x 3   Entered and Authorized by:   Jacques Navy MD   Signed by:   Jacques Navy MD on 05/13/2007   Method used:   Electronically sent to ...       [facility] [address] [phone]*       [address]       [place], [place]  [address]       Ph: [phone] or [phone]       Fax: [phone]   RxID:   [id number]  ]

## 2010-07-15 NOTE — Letter (Signed)
Summary: [facility]  [facility]   Imported By: Lester [place] 03/21/2009 09:18:20  _____________________________________________________________________  External Attachment:    Type:   Image     Comment:   External Document

## 2010-07-15 NOTE — Progress Notes (Signed)
Summary: med refill  Phone Note Call from Patient Call back at Home Phone [phone]   Caller: Patient Call For: Dr Debby Bud Reason for Call: Refill Medication Summary of Call: Please call mail order to refill pt rx before 08/26/07. Patient is completely out of medication. Initial call taken by: Rock Nephew CMA,  August 24, 2007 1:51 PM  Follow-up for Phone Call        [facility] [phone] per patient, spoke with pharmacist who need verbal ok to process both Zocor and Diltiazem. Per Dr Jonny Ruiz for Dr Debby Bud, ok to fill. Patient notified Follow-up by: Rock Nephew CMA,  August 24, 2007 2:40 PM

## 2010-07-15 NOTE — Letter (Signed)
Summary: [facility]  [facility]   Imported By: Sherian Rein 03/13/2009 12:19:54  _____________________________________________________________________  External Attachment:    Type:   Image     Comment:   External Document

## 2010-07-15 NOTE — Procedures (Signed)
Summary: Colon   Colonoscopy  Procedure date:  01/22/2004  Findings:      Location:  [facility].    Procedures Next Due Date:    Colonoscopy: 01/2009  Colonoscopy  Procedure date:  01/22/2004  Findings:      Location:  [facility].    Procedures Next Due Date:    Colonoscopy: 01/2009  Patient Name: Ashley Willis, Ashley Willis MRN:  Procedure Procedures: Colonoscopy CPT: 16109.  Personnel: Endoscopist: Vania Rea. Jarold Motto, MD.  Exam Location: Exam performed in Outpatient Clinic. Outpatient  Patient Consent: Procedure, Alternatives, Risks and Benefits discussed, consent obtained, from patient. Consent was obtained by the RN.  Indications  Surveillance of: Adenomatous Polyp(s).  History  Current Medications: Patient is not currently taking Coumadin.  Pre-Exam Physical: Performed Jan 22, 2004. Cardio-pulmonary exam, Rectal exam, Abdominal exam, Extremity exam, Mental status exam WNL.  Exam Exam: Extent of exam reached: Cecum, extent intended: Cecum.  The cecum was identified by appendiceal orifice and IC valve. Patient position: on left side. Duration of exam: 20 minutes. Colon retroflexion performed. Images taken. ASA Classification: I. Tolerance: excellent.  Monitoring: Pulse and BP monitoring, Oximetry used. Supplemental O2 given. at 2 Liters.  Colon Prep Used Golytely for colon prep. Prep results: excellent.  Sedation Meds: Patient assessed and found to be appropriate for moderate (conscious) sedation. Fentanyl 75 mcg. given IV. Versed 7 mg. given IV.  Instrument(s): CF 140L. Serial [id number].  Findings - DIVERTICULOSIS: Descending Colon to Sigmoid Colon. Not bleeding. ICD9: Diverticulosis, Colon: 562.10.  - NORMAL EXAM: Cecum to Rectum. Not Seen: Polyps. AVM's. Colitis. Tumors. Melanosis. Crohn's. Hemorrhoids.   Assessment  Diagnoses: 562.10: Diverticulosis, Colon.   Events  Unplanned Interventions: No intervention was  required.  Plans Medication Plan: Continue current medications.  Patient Education: Patient given standard instructions for: Diverticulosis. Patient instructed to get routine colonoscopy every 5 years. high fiber diet.  Disposition: After procedure patient sent to recovery. After recovery patient sent home.  Scheduling/Referral: Follow-Up prn.    CC: Rosalyn Gess. Norins, MD  This report was created from the original endoscopy report, which was reviewed and signed by the above listed endoscopist.

## 2010-07-15 NOTE — Assessment & Plan Note (Signed)
Summary: med refill   stc   Vital Signs:  Patient profile:   77 year old female Weight:      122 pounds BMI:     23.91 Temp:     98.3 degrees F oral Pulse rate:   59 / minute BP sitting:   120 / 56  (left arm) Cuff size:   regular  Vitals Entered By: Lamar Sprinkles, CMA (October 08, 2009 3:48 PM) CC: F/u, needs med refills. Pt takes asprin daily, please remove asprin from allergy list.    Primary Care Provider:  Illene Regulus, MD  CC:  F/u, needs med refills. Pt takes asprin daily, and please remove asprin from allergy list. .  History of Present Illness: In the interval since her last visit she has seen Dr. Vivien Rossetti for rheum. and is thought to be doing well on MTX with approrpiate lab monitoring. she has been seen by Dr. Truett Perna at the [facility] for iron deficiency anemia- with last Hgb 11.9g. She is to return in Sept or Oct '11. She has been seen by Dr. Tresa Endo at [facility] who did routine lipid labs with appropriate adjustment in medications. She reports that she is to be stress tested next year due to her stability. (reviewed all available correspondence/data)  She reports that she is feeling well and doing well. She denies chest pain, breathing trouble, joint pain or weakness.    Current Medications (verified): 1)  Cardizem Cd 180 Mg  Cp24 (Diltiazem Hcl Coated Beads) .Marland Kitchen.. 1 By Mouth Once Daily 2)  Aspirin 325 Mg  Tabs (Aspirin) .Marland Kitchen.. 1 By Mouth Once Daily 3)  Vitamin E 400 Unit  Caps (Vitamin E) .Marland Kitchen.. 1 By Mouth Once Daily 4)  Zocor 80 Mg  Tabs (Simvastatin) .Marland Kitchen.. 1 By Mouth Once Daily 5)  Protonix 40 Mg  Tbec (Pantoprazole Sodium) .Marland Kitchen.. 1 By Mouth Once Daily 6)  Atenolol 50 Mg  Tabs (Atenolol) .Marland Kitchen.. 1 By Mouth Once Daily 7)  Diovan 80 Mg  Tabs (Valsartan) .Marland Kitchen.. 1 By Mouth Once Daily 8)  Vitamin C 500 Mg  Tabs (Ascorbic Acid) .... 2 Once Daily 9)  B-100 Complex   Tabs (Vitamins-Lipotropics) .Marland Kitchen.. 1 By Mouth Once Daily 10)  Iron 325 (65 Fe) Mg  Tabs (Ferrous Sulfate) .Marland Kitchen.. 1 By Mouth Once  Daily 11)  Actonel 35 Mg  Tabs (Risedronate Sodium) .... Weekly 12)  Caltrate 600+d 600-400 Mg-Unit  Tabs (Calcium Carbonate-Vitamin D) .Marland Kitchen.. 1 By Mouth Once Daily 13)  Imodium A-D 2 Mg  Tabs (Loperamide Hcl) .... As Needed 14)  Temazepam 15 Mg  Caps (Temazepam) .... At Bedtime As Needed  Allergies (verified): 1)  ! Codeine 2)  ! Jonne Ply  Past History:  Past Medical History: Last updated: 05/13/2007 Colonic polyps, hx of Coronary artery disease Hyperlipidemia Hypertension Pancreatitis, hx of Rheumatoid arthritis   Physician Roster:       Card Tresa Endo       Hematology - Sherrill       Rheum - Zimenski       GS Derrell Lolling  Past Surgical History: Last updated: 08/27/2008 Appendectomy Percutaneous transluminal coronary angioplasty TAH/BSO, HX OF (ICD-V45.77) POLYPECTOMY, HX OF (ICD-V15.9) Cholecystectomy bilateral axillary adenopathy  Family History: Last updated: 01-Sep-2008 father- died 67's; mother - died 25's; DM 1 sister- healthy 6 brothers- oldest CVA, two died unknown cause; 3 living, one with CVA No FH of Colon Cancer:  Social History: Last updated: 09-01-08 married 1948 1 daughter 50; 3 sons 75, 1, 77 75  grandchildrens; 6 great-grandchildren marriage in good health Occupation: hosiery mill Patient has never smoked.  Alcohol Use - no Illicit Drug Use - no  Risk Factors: Exercise: no (05/13/2007)  Risk Factors: Smoking Status: never (08/28/2008)  Review of Systems  The patient denies anorexia, fever, weight loss, weight gain, decreased hearing, hoarseness, chest pain, syncope, dyspnea on exertion, peripheral edema, headaches, hemoptysis, abdominal pain, severe indigestion/heartburn, muscle weakness, difficulty walking, abnormal bleeding, and enlarged lymph nodes.    Physical Exam  General:  WNWD AA female in no distress Head:  Normocephalic and atraumatic without obvious abnormalities. No apparent alopecia or balding. Eyes:  vision grossly  intact, pupils equal, pupils round, corneas and lenses clear, and no injection.   Mouth:  edentulous with dentures Neck:  supple, full ROM, no thyromegaly, and no carotid bruits.   Chest Wall:  No deformities, masses, or tenderness noted. Lungs:  Normal respiratory effort, chest expands symmetrically. Lungs are clear to auscultation, no crackles or wheezes. Heart:  Normal rate and regular rhythm. S1 and S2 normal without gallop, murmur, click, rub or other extra sounds. Abdomen:  soft, non-tender, and normal bowel sounds.   Msk:  normal ROM, no joint tenderness, no joint swelling, no joint warmth, no redness over joints, no joint instability, and no crepitation.   Pulses:  2+ radial Neurologic:  alert & oriented X3, cranial nerves II-XII intact, strength normal in all extremities, sensation intact to light touch, and gait normal.     Impression & Recommendations:  Problem # 1:  VITAMIN B12 DEFICIENCY (ICD-266.2) She continues on B12 replacement. she will be due for lab work soon.  Problem # 2:  ANEMIA, IRON DEFICIENCY (ICD-280.9) She is stable. Will continue iron replacement. She has had a thorough eval with no indication for further testing at this time.  Her updated medication list for this problem includes:    Iron 325 (65 Fe) Mg Tabs (Ferrous sulfate) .Marland Kitchen... 1 by mouth once daily  Problem # 3:  RHEUMATOID ARTHRITIS (ICD-714.0) Followed closely by Dr. Jimmy Footman. She is tolerating MX well and she is not having any joint pain at today's visit.   Problem # 4:  HYPERTENSION (ICD-401.9)  Her updated medication list for this problem includes:    Cardizem Cd 180 Mg Cp24 (Diltiazem hcl coated beads) .Marland Kitchen... 1 by mouth once daily    Atenolol 50 Mg Tabs (Atenolol) .Marland Kitchen... 1 by mouth once daily    Diovan 80 Mg Tabs (Valsartan) .Marland Kitchen... 1 by mouth once daily  BP today: 120/56 Prior BP: 116/70 (10/16/2008)  Prior 10 Yr Risk Heart Disease: N/A (08/28/2008)  Labs Reviewed: K+: 4.2  (08/28/2008) Creat: : 0.8 (08/28/2008)   Chol: 236 (10/16/2008)   HDL: 53.10 (10/16/2008)   TG: 340.0 (10/16/2008)  Good control on present medications. She reports having had lab [facility].  Orders: Prescription Created Electronically [id number])  Problem # 5:  HYPERLIPIDEMIA (ICD-272.4) She reports having had labs at [facility] - results are not available to me.  Plan - per cardiology  Her updated medication list for this problem includes:    Zocor 80 Mg Tabs (Simvastatin) .Marland Kitchen... 1 by mouth once daily  Problem # 6:  Summary Patient with multiple medical problems and a complete set of consultants. She does seem to be doing well. All her Rx's were refilled. She is asked to return in 6 months or as needed.   Complete Medication List: 1)  Cardizem Cd 180 Mg Cp24 (Diltiazem hcl coated beads) .Marland Kitchen.. 1 by mouth once  daily 2)  Aspirin 325 Mg Tabs (Aspirin) .Marland Kitchen.. 1 by mouth once daily 3)  Vitamin E 400 Unit Caps (Vitamin e) .Marland Kitchen.. 1 by mouth once daily 4)  Zocor 80 Mg Tabs (Simvastatin) .Marland Kitchen.. 1 by mouth once daily 5)  Protonix 40 Mg Tbec (Pantoprazole sodium) .Marland Kitchen.. 1 by mouth once daily 6)  Atenolol 50 Mg Tabs (Atenolol) .Marland Kitchen.. 1 by mouth once daily 7)  Diovan 80 Mg Tabs (Valsartan) .Marland Kitchen.. 1 by mouth once daily 8)  Vitamin C 500 Mg Tabs (Ascorbic acid) .... 2 once daily 9)  B-100 Complex Tabs (Vitamins-lipotropics) .Marland Kitchen.. 1 by mouth once daily 10)  Iron 325 (65 Fe) Mg Tabs (Ferrous sulfate) .Marland Kitchen.. 1 by mouth once daily 11)  Actonel 35 Mg Tabs (Risedronate sodium) .... Weekly 12)  Caltrate 600+d 600-400 Mg-unit Tabs (Calcium carbonate-vitamin d) .Marland Kitchen.. 1 by mouth once daily 13)  Imodium A-d 2 Mg Tabs (Loperamide hcl) .... As needed 14)  Temazepam 15 Mg Caps (Temazepam) .... At bedtime as needed Prescriptions: ACTONEL 35 MG  TABS (RISEDRONATE SODIUM) weekly  #12 x 3   Entered and Authorized by:   Jacques Navy MD   Signed by:   Jacques Navy MD on 10/08/2009   Method used:   Electronically to        [facility]* (mail-order)             ,          Ph: [phone]       Fax: [phone]   RxID:   [id number] DIOVAN 80 MG  TABS (VALSARTAN) 1 by mouth once daily  #90 x 3   Entered and Authorized by:   Jacques Navy MD   Signed by:   Jacques Navy MD on 10/08/2009   Method used:   Electronically to        [facility]* (mail-order)             ,          Ph: [phone]       Fax: [phone]   RxID:   [id number] ATENOLOL 50 MG  TABS (ATENOLOL) 1 by mouth once daily  #90 x 3   Entered and Authorized by:   Jacques Navy MD   Signed by:   Jacques Navy MD on 10/08/2009   Method used:   Electronically to        [facility]* (mail-order)             ,          Ph: [phone]       Fax: [phone]   RxID:   [id number] PROTONIX 40 MG  TBEC (PANTOPRAZOLE SODIUM) 1 by mouth once daily  #90 x 3   Entered and Authorized by:   Jacques Navy MD   Signed by:   Jacques Navy MD on 10/08/2009   Method used:   Electronically to        [facility]* (mail-order)             ,          Ph: [phone]       Fax: [phone]   RxID:   [id number] ZOCOR 80 MG  TABS (SIMVASTATIN) 1 by mouth once daily  #90 x 3   Entered and Authorized by:   Jacques Navy MD   Signed by:   Jacques Navy MD on 10/08/2009   Method used:  Electronically to        [facility]* (mail-order)             ,          Ph: [phone]       Fax: [phone]   RxID:   [id number] CARDIZEM CD 180 MG  CP24 (DILTIAZEM HCL COATED BEADS) 1 by mouth once daily  #90 x 3   Entered and Authorized by:   Jacques Navy MD   Signed by:   Jacques Navy MD on 10/08/2009   Method used:   Electronically to        [facility]* (mail-order)             ,          Ph: [phone]       Fax: [phone]   RxID:   [id number]

## 2010-07-15 NOTE — Assessment & Plan Note (Signed)
Summary: WEIGHT LOSS/YF   History of Present Illness Visit Type:  Follow-up Visit Primary GI MD:  Sheryn Bison MD Primary MD:  Illene Regulus, MD Referral MD:  Thornton Papas, MD Chief Complaint:  weight loss History of Present Illness:  This patient is an 77 year old female with seronegative rheumatoid arthritis and chronic nonspecific anemia and severe coronary artery disease and chronic congestive heart failure  who I have followed for many years  for acid reflux symptoms and symptomatic diverticulosis. Her last colonoscopy was in August of 2005. She has a long history of recurrent weight loss and actually was seen in May of 2006 for the same complaint. At that time she was found to have multiple gallstones with a thickened gallbladder wall and was referred to Dr. Derrell Lolling and surgery was planned but was never completed for reasons I cannot see.  In any case she again is referred for anorexia weight loss and the only symptom I can elicit is early satiety and vague epigastric discomfort postprandially without nausea and vomiting, reflux symptoms, or dysphasia. She is on a variety of medications including Actonel but does not describe acid reflux at this time. She also denies any specific hepatobiliary complaints or systemic complaints. She deniesany food intolerances. She has not had melena or hematochezia.  Actually chart review shows that she did have laparoscopic cholecystectomy in December of 2006. The patient certainly does not remember this operation which is of some concern regarding her mental status.   GI Review of Systems      Reports weight loss.   Weight loss of  11 pounds over 1 year.    Denies abdominal pain, acid reflux, belching, bloating, chest pain, dysphagia with liquids, dysphagia with solids, heartburn, loss of appetite, nausea, vomiting, vomiting blood, and  weight gain.        Denies anal fissure, black tarry stools, change in bowel habit, constipation,  diverticulosis, fecal incontinence, heme positive stool, hemorrhoids, irritable bowel syndrome, jaundice, light color stool, liver problems, rectal bleeding, and  rectal pain.      Current Medications (verified): 1)  Cardizem Cd 180 Mg  Cp24 (Diltiazem Hcl Coated Beads) .Marland Kitchen.. 1 By Mouth Once Daily 2)  Aspirin 325 Mg  Tabs (Aspirin) .Marland Kitchen.. 1 By Mouth Once Daily 3)  Vitamin E 400 Unit  Caps (Vitamin E) .Marland Kitchen.. 1 By Mouth Once Daily 4)  Zocor 80 Mg  Tabs (Simvastatin) .Marland Kitchen.. 1 By Mouth Once Daily 5)  Protonix 40 Mg  Tbec (Pantoprazole Sodium) .Marland Kitchen.. 1 By Mouth Once Daily 6)  Atenolol 50 Mg  Tabs (Atenolol) .Marland Kitchen.. 1 By Mouth Once Daily 7)  Diovan 80 Mg  Tabs (Valsartan) .Marland Kitchen.. 1 By Mouth Once Daily 8)  Vitamin C 500 Mg  Tabs (Ascorbic Acid) .... 2 Once Daily 9)  B-100 Complex   Tabs (Vitamins-Lipotropics) .Marland Kitchen.. 1 By Mouth Once Daily 10)  Iron 325 (65 Fe) Mg  Tabs (Ferrous Sulfate) .Marland Kitchen.. 1 By Mouth Once Daily 11)  Actonel 35 Mg  Tabs (Risedronate Sodium) .... Weekly 12)  Caltrate 600+d 600-400 Mg-Unit  Tabs (Calcium Carbonate-Vitamin D) .Marland Kitchen.. 1 By Mouth Once Daily 13)  Imodium A-D 2 Mg  Tabs (Loperamide Hcl) .... As Needed 14)  Temazepam 15 Mg  Caps (Temazepam) .... At Bedtime As Needed  Allergies (verified): 1)  ! Codeine  Past History:  Past Medical History:    Reviewed history from 05/13/2007 and no changes required:    Colonic polyps, hx of    Coronary artery disease  Hyperlipidemia    Hypertension    Pancreatitis, hx of    Rheumatoid arthritis            Physician Roster:          Card - Tresa Endo          Hematology - Sherrill          Rheum - Zimenski          GS Derrell Lolling  Past Surgical History:    Reviewed history from 08/27/2008 and no changes required:    Appendectomy    Percutaneous transluminal coronary angioplasty    TAH/BSO, HX OF (ICD-V45.77)    POLYPECTOMY, HX OF (ICD-V15.9)    Cholecystectomy    bilateral axillary adenopathy  Family History:    father- died 15's;     mother - died 60's; DM    1 sister- healthy    6 brothers- oldest CVA, two died unknown cause; 3 living, one with CVA    No FH of Colon Cancer:  Social History:    married 1948    1 daughter 36; 3 sons 10, 103, 41    9 grandchildrens; 6 great-grandchildren    marriage in good health    Occupation: hosiery mill    Patient has never smoked.     Alcohol Use - no    Illicit Drug Use - no    Drug Use:  no  Review of Systems       The patient complains of arthritis/joint pain, back pain, confusion, and fatigue.  The patient denies allergy/sinus, anemia, anxiety-new, blood in urine, breast changes/lumps, change in vision, cough, coughing up blood, depression-new, fainting, fever, headaches-new, hearing problems, heart murmur, heart rhythm changes, itching, menstrual pain, muscle pains/cramps, night sweats, nosebleeds, pregnancy symptoms, shortness of breath, skin rash, sleeping problems, sore throat, swelling of feet/legs, swollen lymph glands, thirst - excessive , urination - excessive , urination changes/pain, urine leakage, vision changes, and voice change.    Vital Signs:  Patient profile:   77 year old female Height:      60 inches Weight:      114 pounds BMI:     22.34 Pulse rate:   72 / minute Pulse rhythm:   regular BP sitting:   128 / 60  (left arm)  Vitals Entered By: Milford Cage CMA (August 28, 2008 3:07 PM)  Physical Exam  General:  Well developed, well nourished, no acute distress.healthy appearing and short statured.   Head:  Normocephalic and atraumatic. Eyes:  PERRLA, no icterus.exam deferred to patient's ophthalmologist.   Lungs:  Clear throughout to auscultation. Heart:  Regular rate and rhythm; no murmurs, rubs,  or bruits. Abdomen:  Soft, nontender and nondistended. No masses, hepatosplenomegaly or hernias noted. Normal bowel sounds. Extremities:  No clubbing, cyanosis, edema or deformities noted. Neurologic:  Alert and  oriented x4;  grossly normal  neurologically. Psych:  Alert and cooperative. Normal mood and affect.poor concentration and poor memory.     Impression & Recommendations:  Problem # 1:  GALLSTONES (ICD-574.20) She Is Status post cholecystectomy but I'll repeat her liver enzymes and upper abdominal ultrasound exam. Orders: TLB-CBC Platelet - w/Differential (85025-CBCD) TLB-BMP (Basic Metabolic Panel-BMET) (80048-METABOL) TLB-Hepatic/Liver Function Pnl (80076-HEPATIC) TLB-TSH (Thyroid Stimulating Hormone) (84443-TSH) TLB-B12, Serum-Total ONLY (09811-B14) TLB-Ferritin (82728-FER) TLB-Folic Acid (Folate) (82746-FOL) TLB-IBC Pnl (Iron/FE;Transferrin) (83550-IBC) TLB-Sedimentation Rate (ESR) (85652-ESR)  Problem # 2:  WEIGHT LOSS (ICD-783.21) Assessment: Unchanged This is a chronic complaint which has been present for many years and  she may have mild underlying gastroparesis.We will repeat her endoscopic exam and ultrasound and labs. Orders: EGD (EGD) Ultrasound Abdomen (UAS) TLB-CBC Platelet - w/Differential (85025-CBCD) TLB-BMP (Basic Metabolic Panel-BMET) (80048-METABOL) TLB-Hepatic/Liver Function Pnl (80076-HEPATIC) TLB-TSH (Thyroid Stimulating Hormone) (84443-TSH) TLB-B12, Serum-Total ONLY (47829-F62) TLB-Ferritin (82728-FER) TLB-Folic Acid (Folate) (82746-FOL) TLB-IBC Pnl (Iron/FE;Transferrin) (83550-IBC) TLB-Sedimentation Rate (ESR) (85652-ESR)  Problem # 3:  DIVERTICULOSIS, COLON (ICD-562.10) Assessment: Unchanged High-fiber diet as tolerated.  Problem # 4:  ANEMIA, IRON DEFICIENCY (ICD-280.9) Assessment: Improved follow up hematology as previously scheduled.  Problem # 5:  TAH/BSO, HX OF (ICD-V45.77) Assessment: Unchanged  Problem # 6:  CORONARY ARTERY DISEASE (ICD-414.00) Assessment: Unchanged continue multiple medications for her multiple physicians.  Problem # 7:  MEMORY LOSS (ICD-780.93) Assessment: New Labs ordered and we'll send this report to Dr.Sherrill , and Dr. Debby Bud.  Patient  Instructions: 1)  Copy sent to : Dr. Rolm Baptise and Dr. Wyonia Hough 2)  Please continue current medications.  3)  Conscious Sedation brochure given.  4)  Upper Endoscopy brochure given.  5)  Labs ordered 6)  Consider neurology referral. 7)  Consider gastric emptying scan.

## 2010-07-15 NOTE — Letter (Signed)
Summary: [facility]: Immunoassay Fecal Occult Blood (iFOB) Order [facility]  [address] [place], [place] [address]   Phone: [phone]  Fax: [phone]      [place] [facility]: Immunoassay Fecal Occult Blood (iFOB) Order Form   February 04, 2010 MRN: [id number]   INFANTOF VILLAGOMEZ 27-Jun-1926   Physicican Name: Jarold Motto  Diagnosis Code: 285.9      Ashok Cordia RN

## 2010-07-15 NOTE — Letter (Signed)
Summary: The [facility]  The [facility]   Imported By: Esmeralda Links D'jimraou 06/28/2008 15:40:50  _____________________________________________________________________  External Attachment:    Type:   Image     Comment:   External Document

## 2010-07-15 NOTE — Assessment & Plan Note (Signed)
Summary: MONTHLY B-12...LSW.  Nurse Visit  Medication Administration  Injection # 1:    Medication: Vit B12 1000 mcg    Diagnosis: VITAMIN B12 DEFICIENCY (ICD-266.2)    Route: IM    Site: L deltoid    Exp Date: 01/2011    Lot #: [id number]    Mfr: [employer]    Comments: Pt will return on 05/27/09 for next injection    Patient tolerated injection without complications    Given by: Francee Piccolo CMA (AAMA) (April 29, 2009 10:10 AM)  Orders Added: 1)  Vit B12 1000 mcg [J3420]

## 2010-07-15 NOTE — Assessment & Plan Note (Signed)
Summary: MONTHLY B12/YF  Nurse Visit   Medication Administration  Injection # 1:    Medication: Vit B12 1000 mcg    Diagnosis: VITAMIN B12 DEFICIENCY (ICD-266.2)    Route: IM    Site: R deltoid    Exp Date: 12/2011    Lot #: [id number]    Mfr: [employer]    Comments: pt will return in one month for next injection.    Patient tolerated injection without complications    Given by: Francee Piccolo CMA Duncan Dull) (March 06, 2010 10:33 AM)  Orders Added: 1)  Vit B12 1000 mcg [J3420]

## 2010-07-15 NOTE — Assessment & Plan Note (Signed)
Summary: B12 SHOT...LSW.  Nurse Visit   Allergies: 1)  ! Codeine 2)  ! Asa  Medication Administration  Injection # 1:    Medication: Vit B12 1000 mcg    Diagnosis: VITAMIN B12 DEFICIENCY (ICD-266.2)    Route: IM    Site: L deltoid    Exp Date: 07/17/2011    Lot #: [id number]    Mfr: [employer]    Comments: Monthly injection, patient scheduled next appointment.    Patient tolerated injection without complications    Given by: June McMurray CMA Duncan Dull) (December 04, 2009 10:04 AM)  Orders Added: 1)  Vit B12 1000 mcg [J3420]   Medication Administration  Injection # 1:    Medication: Vit B12 1000 mcg    Diagnosis: VITAMIN B12 DEFICIENCY (ICD-266.2)    Route: IM    Site: L deltoid    Exp Date: 07/17/2011    Lot #: [id number]    Mfr: [employer]    Comments: Monthly injection, patient scheduled next appointment.    Patient tolerated injection without complications    Given by: June McMurray CMA Duncan Dull) (December 04, 2009 10:04 AM)  Orders Added: 1)  Vit B12 1000 mcg [J3420]

## 2010-07-15 NOTE — Letter (Signed)
Summary: Patient Notice- Polyp Results  [facility]  [address] [place], [place] [address]   Phone: [phone]  Fax: [phone]        March 04, 2010 MRN: [id number]    Ashley Willis [address] [place], [place]  [address]    Dear [name],  I am pleased to inform you that the colon polyp(s) removed during your recent colonoscopy was (were) found to be benign (no cancer detected) upon pathologic examination.  I recommend you have a repeat colonoscopy examination in _5 years to look for recurrent polyps, as having colon polyps increases your risk for having recurrent polyps or even colon cancer in the future.The polyp was a benign adenoma. Followup in 5 years depending on your clinical health  Should you develop new or worsening symptoms of abdominal pain, bowel habit changes or bleeding from the rectum or bowels, please schedule an evaluation with either your primary care physician or with me.  Additional information/recommendations:  _x_ No further action with gastroenterology is needed at this time. Please      follow-up with your primary care physician for your other healthcare      needs.  __ Please call [phone] to schedule a return visit to review your      situation.  __ Please keep your follow-up visit as already scheduled.  __ Continue treatment plan as outlined the day of your exam.  Please call us if you are having persistent problems or have questions about your condition that have not been fully answered at this time.  Sincerely,  Mardella Layman MD [facility]  This letter has been electronically signed by your physician.  Appended Document: Patient Notice- Polyp Results letter mailed.

## 2010-07-15 NOTE — Assessment & Plan Note (Signed)
Summary: MONTHLY B12 SHOT...LSW.  Nurse Visit   Allergies: 1)  ! Codeine 2)  ! Asa  Medication Administration  Injection # 1:    Medication: Vit B12 1000 mcg    Diagnosis: VITAMIN B12 DEFICIENCY (ICD-266.2)    Route: IM    Site: L deltoid    Exp Date: 12/14/2011    Lot #: [id number]    Mfr: [employer]    Patient tolerated injection without complications    Given by: Lowry Ram NCMA (May 05, 2010 1:35 PM)

## 2010-07-15 NOTE — Procedures (Signed)
Summary: Colonoscopy  Patient: Ashley Willis Note: All result statuses are Final unless otherwise noted.  Tests: (1) Colonoscopy (COL)   COL Colonoscopy           DONE     [facility]     [address]     [place], [place]  [address]           COLONOSCOPY PROCEDURE REPORT           PATIENT:  [name], [name]  MR#:  [id number]     BIRTHDATE:  [birth year], 83 yrs. old  GENDER:  female     ENDOSCOPIST:  Vania Rea. Jarold Motto, MD, [facility]     REF. BY:  Rosalyn Gess. Norins, M.D.     PROCEDURE DATE:  02/28/2010     PROCEDURE:  Colonoscopy with snare polypectomy     ASA CLASS:  Class II     INDICATIONS:  heme positive stool     MEDICATIONS:   Fentanyl 50 mcg IV, Versed 4 mg IV           DESCRIPTION OF PROCEDURE:   After the risks benefits and     alternatives of the procedure were thoroughly explained, informed     consent was obtained.  Digital rectal exam was performed and     revealed no rectal masses.   The [id number] endoscope was     introduced through the anus and advanced to the cecum, which was     identified by both the appendix and ileocecal valve, limited by     stenosis.  SEVERE SIGMOID TICS AND NARROWING.  The quality of the     prep was good, using MoviPrep.  The instrument was then slowly     withdrawn as the colon was fully examined.     <<PROCEDUREIMAGES>>           FINDINGS:  ULTRASONIC FINDINGS:  There were multiple polyps     identified and removed. in the right colon.  FLAT RIGHT COLON     ADENOMAS HOT SNARE EXCISED. Severe diverticulosis was found in the     sigmoid to descending colon segments.   Retroflexed views in the     rectum revealed no abnormalities.    The scope was then withdrawn     from the patient and the procedure completed.           COMPLICATIONS:  None     ENDOSCOPIC IMPRESSION:     1) Polyps, multiple in the right colon     2) Severe diverticulosis in the sigmoid to descending colon     segments     R/O ADENOMAS  RECOMMENDATIONS:     1) Await biopsy results     F/U PRN PER AGE.     REPEAT EXAM:  No           ______________________________     Vania Rea. Jarold Motto, MD, [facility]           CC:           n.     eSIGNED:   Vania Rea. Patterson at 02/28/2010 03:25 PM           Sharl Ma, [id number]  Note: An exclamation mark (!) indicates a result that was not dispersed into the flowsheet. Document Creation Date: 02/28/2010 3:26 PM _______________________________________________________________________  (1) Order result status: Final Collection or observation date-time: 02/28/2010 15:17 Requested date-time:  Receipt date-time:  Reported date-time:  Referring Physician:  Ordering Physician: Sheryn Bison [phone]) Specimen Source:  Source: Launa Grill Order Number: [id number] Lab site:   Appended Document: Colonoscopy     Procedures Next Due Date:    Colonoscopy: 02/2015

## 2010-07-15 NOTE — Assessment & Plan Note (Signed)
Summary: Monthly B12, 266.2  Nurse Visit   Allergies: 1)  ! Codeine 2)  ! Asa  Medication Administration  Injection # 1:    Medication: Vit B12 1000 mcg    Diagnosis: VITAMIN B12 DEFICIENCY (ICD-266.2)    Route: IM    Site: L deltoid    Exp Date: 01/2012    Lot #: [id number]    Mfr: [employer]    Comments: pt to schedule next monthly b12 at front desk    Patient tolerated injection without complications    Given by: Chales Abrahams CMA Duncan Dull) (May 05, 2010 10:07 AM)  Orders Added: 1)  Vit B12 1000 mcg [J3420]

## 2010-07-15 NOTE — Letter (Signed)
Summary: [facility]   Imported By: Lester Weir 10/26/2008 07:44:52  _____________________________________________________________________  External Attachment:    Type:   Image     Comment:   External Document

## 2010-07-15 NOTE — Assessment & Plan Note (Signed)
Summary: low hemoglobin/#/cd   Vital Signs:  Patient profile:   77 year old female Height:      60 inches Weight:      118 pounds BMI:     23.13 O2 Sat:      96 % on Room air Temp:     98.1 degrees F oral Pulse rate:   67 / minute BP sitting:   110 / 58  (left arm) Cuff size:   regular  Vitals Entered By: Bill Salinas CMA (January 03, 2010 10:28 AM)  O2 Flow:  Room air   Primary Care Provider:  Illene Regulus, MD   History of Present Illness: Patient presents for follow-up of newly diagnosed anemia. At Dr. Velora Heckler office her Hgb was 10.7 g down from her baseline of 12+g. She has had no bleeding, no change in bowel habit or other symptoms or signs to explain blood loss.   Current Medications (verified): 1)  Cardizem Cd 180 Mg  Cp24 (Diltiazem Hcl Coated Beads) .Marland Kitchen.. 1 By Mouth Once Daily 2)  Aspirin 325 Mg  Tabs (Aspirin) .Marland Kitchen.. 1 By Mouth Once Daily 3)  Vitamin E 400 Unit  Caps (Vitamin E) .Marland Kitchen.. 1 By Mouth Once Daily 4)  Zocor 80 Mg  Tabs (Simvastatin) .Marland Kitchen.. 1 By Mouth Once Daily 5)  Protonix 40 Mg  Tbec (Pantoprazole Sodium) .Marland Kitchen.. 1 By Mouth Once Daily 6)  Atenolol 50 Mg  Tabs (Atenolol) .Marland Kitchen.. 1 By Mouth Once Daily 7)  Diovan 80 Mg  Tabs (Valsartan) .Marland Kitchen.. 1 By Mouth Once Daily 8)  Vitamin C 500 Mg  Tabs (Ascorbic Acid) .... 2 Once Daily 9)  B-100 Complex   Tabs (Vitamins-Lipotropics) .Marland Kitchen.. 1 By Mouth Once Daily 10)  Iron 325 (65 Fe) Mg  Tabs (Ferrous Sulfate) .Marland Kitchen.. 1 By Mouth Once Daily 11)  Actonel 35 Mg  Tabs (Risedronate Sodium) .... Weekly 12)  Caltrate 600+d 600-400 Mg-Unit  Tabs (Calcium Carbonate-Vitamin D) .Marland Kitchen.. 1 By Mouth Once Daily 13)  Imodium A-D 2 Mg  Tabs (Loperamide Hcl) .... As Needed 14)  Temazepam 15 Mg  Caps (Temazepam) .... At Bedtime As Needed  Allergies (verified): 1)  ! Codeine 2)  ! Asa PMH-FH-SH reviewed-no changes except otherwise noted  Review of Systems  The patient denies anorexia, fever, weight loss, chest pain, dyspnea on exertion, abdominal pain,  melena, hematochezia, muscle weakness, and abnormal bleeding.    Physical Exam  General:  WNWD AA female in no distress Head:  Normocephalic and atraumatic without obvious abnormalities. No apparent alopecia or balding. Eyes:  C&S clear Lungs:  normal respiratory effort.   Heart:  normal rate and regular rhythm.   Abdomen:  soft, non-tender, normal bowel sounds, and no guarding.   Pulses:  2+ radial Neurologic:  alert & oriented X3 and gait normal.   Skin:  turgor normal, color normal, and no suspicious lesions.   Psych:  Oriented X3, normally interactive, and good eye contact.     Impression & Recommendations:  Problem # 1:  UNSPECIFIED ANEMIA (ICD-285.9) Patient with newly diagnosed anemia with no signs or symptoms to suggest acute blood loss. She had been told to hold atenolol. Last colonoscopy '05 - normal  Plan - further work-up of anemia: B12, iron level, retic count, repeat CBC  Her updated medication list for this problem includes:    Iron 325 (65 Fe) Mg Tabs (Ferrous sulfate) .Marland Kitchen... 1 by mouth once daily  Orders: TLB-CBC Platelet - w/Differential (85025-CBCD) TLB-IBC Pnl (Iron/FE;Transferrin) (83550-IBC) T-Reticulocyte Count,  Automated [phone])  Complete Medication List: 1)  Cardizem Cd 180 Mg Cp24 (Diltiazem hcl coated beads) .Marland Kitchen.. 1 by mouth once daily 2)  Aspirin 325 Mg Tabs (Aspirin) .Marland Kitchen.. 1 by mouth once daily 3)  Vitamin E 400 Unit Caps (Vitamin e) .Marland Kitchen.. 1 by mouth once daily 4)  Zocor 80 Mg Tabs (Simvastatin) .Marland Kitchen.. 1 by mouth once daily 5)  Protonix 40 Mg Tbec (Pantoprazole sodium) .Marland Kitchen.. 1 by mouth once daily 6)  Atenolol 50 Mg Tabs (Atenolol) .Marland Kitchen.. 1 by mouth once daily 7)  Diovan 80 Mg Tabs (Valsartan) .Marland Kitchen.. 1 by mouth once daily 8)  Vitamin C 500 Mg Tabs (Ascorbic acid) .... 2 once daily 9)  B-100 Complex Tabs (Vitamins-lipotropics) .Marland Kitchen.. 1 by mouth once daily 10)  Iron 325 (65 Fe) Mg Tabs (Ferrous sulfate) .Marland Kitchen.. 1 by mouth once daily 11)  Actonel 35 Mg Tabs  (Risedronate sodium) .... Weekly 12)  Caltrate 600+d 600-400 Mg-unit Tabs (Calcium carbonate-vitamin d) .Marland Kitchen.. 1 by mouth once daily 13)  Imodium A-d 2 Mg Tabs (Loperamide hcl) .... As needed 14)  Temazepam 15 Mg Caps (Temazepam) .... At bedtime as needed  Other Orders: TLB-B12 + Folate Pnl ([id number]/MVH)   Preventive Care Screening  Last Flu Shot:    Date:  03/20/2009    Results:  given

## 2010-07-15 NOTE — Assessment & Plan Note (Signed)
Summary: 266.2/monthly b-12 inj  Nurse Visit   Allergies: 1)  ! Codeine 2)  ! Asa  Medication Administration  Injection # 1:    Medication: Vit B12 1000 mcg    Diagnosis: VITAMIN B12 DEFICIENCY (ICD-266.2)    Route: IM    Site: L deltoid    Exp Date: 04/2011    Lot #: [id number]    Mfr: [employer]    Comments: pt scheduled for next monthly B12    Patient tolerated injection without complications    Given by: Merri Ray CMA (AAMA) (August 05, 2009 10:09 AM)  Orders Added: 1)  Vit B12 1000 mcg [J3420]

## 2010-07-15 NOTE — Letter (Signed)
Summary: Weight loss/[facility]  Weight loss/[facility]   Imported By: Sherian Rein 08/22/2008 09:07:20  _____________________________________________________________________  External Attachment:    Type:   Image     Comment:   External Document

## 2010-07-15 NOTE — Assessment & Plan Note (Signed)
Summary: F/U OV & MONTHLY B12 SHOT...LSW.   History of Present Illness Visit Type: Follow-up Visit Primary GI MD: Sheryn Bison MD Primary Provider: Illene Regulus, MD Requesting Provider: Thornton Papas, MD Chief Complaint: Follow up and B12 injection, No GI complaints History of Present Illness:   This Patient denies any gastrointestinal problems. She is on chronic Protonix for GERD and B12 replacement therapy. She recently had mild anemia and review of her labs shows a low folic acid level. B12 level was adequate and hemoglobin was stable.   GI Review of Systems      Denies abdominal pain, acid reflux, belching, bloating, chest pain, dysphagia with liquids, dysphagia with solids, heartburn, loss of appetite, nausea, vomiting, vomiting blood, weight loss, and  weight gain.        Denies anal fissure, black tarry stools, change in bowel habit, constipation, diarrhea, diverticulosis, fecal incontinence, heme positive stool, hemorrhoids, irritable bowel syndrome, jaundice, light color stool, liver problems, rectal bleeding, and  rectal pain.    Current Medications (verified): 1)  Cardizem Cd 180 Mg  Cp24 (Diltiazem Hcl Coated Beads) .Marland Kitchen.. 1 By Mouth Once Daily 2)  Aspirin 325 Mg  Tabs (Aspirin) .Marland Kitchen.. 1 By Mouth Once Daily 3)  Vitamin E 400 Unit  Caps (Vitamin E) .Marland Kitchen.. 1 By Mouth Once Daily 4)  Zocor 80 Mg  Tabs (Simvastatin) .Marland Kitchen.. 1 By Mouth Once Daily 5)  Protonix 40 Mg  Tbec (Pantoprazole Sodium) .Marland Kitchen.. 1 By Mouth Once Daily 6)  Atenolol 50 Mg  Tabs (Atenolol) .Marland Kitchen.. 1 By Mouth Once Daily 7)  Diovan 80 Mg  Tabs (Valsartan) .Marland Kitchen.. 1 By Mouth Once Daily 8)  Vitamin C 500 Mg  Tabs (Ascorbic Acid) .... 2 Once Daily 9)  B-100 Complex   Tabs (Vitamins-Lipotropics) .Marland Kitchen.. 1 By Mouth Once Daily 10)  Iron 325 (65 Fe) Mg  Tabs (Ferrous Sulfate) .Marland Kitchen.. 1 By Mouth Once Daily 11)  Actonel 35 Mg  Tabs (Risedronate Sodium) .... Weekly 12)  Caltrate 600+d 600-400 Mg-Unit  Tabs (Calcium Carbonate-Vitamin D) .Marland Kitchen..  1 By Mouth Once Daily 13)  Imodium A-D 2 Mg  Tabs (Loperamide Hcl) .... As Needed 14)  Temazepam 15 Mg  Caps (Temazepam) .... At Bedtime As Needed  Allergies (verified): 1)  ! Codeine 2)  ! Jonne Ply  Past History:  Family History: Last updated: 2008-09-10 father- died 59's; mother - died 1's; DM 1 sister- healthy 6 brothers- oldest CVA, two died unknown cause; 3 living, one with CVA No FH of Colon Cancer:  Social History: Last updated: September 10, 2008 married 1948 1 daughter 58; 3 sons 52, 42, 52 4 grandchildrens; 6 great-grandchildren marriage in good health Occupation: hosiery mill Patient has never smoked.  Alcohol Use - no Illicit Drug Use - no  Past medical, surgical, family and social histories (including risk factors) reviewed for relevance to current acute and chronic problems.  Past Medical History: Reviewed history from 05/13/2007 and no changes required. Colonic polyps, hx of Coronary artery disease Hyperlipidemia Hypertension Pancreatitis, hx of Rheumatoid arthritis   Physician Roster:       Card Tresa Endo       Hematology - Sherrill       Rheum - Zimenski       GS Derrell Lolling  Past Surgical History: Reviewed history from 08/27/2008 and no changes required. Appendectomy Percutaneous transluminal coronary angioplasty TAH/BSO, HX OF (ICD-V45.77) POLYPECTOMY, HX OF (ICD-V15.9) Cholecystectomy bilateral axillary adenopathy  Family History: Reviewed history from 09/10/2008 and no changes required. father- died  44's; mother - died 60's; DM 1 sister- healthy 6 brothers- oldest CVA, two died unknown cause; 3 living, one with CVA No FH of Colon Cancer:  Social History: Reviewed history from 08/28/2008 and no changes required. 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  Alcohol Use - no Illicit Drug Use - no  Review of  Systems  The patient denies allergy/sinus, anemia, anxiety-new, arthritis/joint pain, back pain, blood in urine, breast changes/lumps, change in vision, confusion, cough, coughing up blood, depression-new, fainting, fatigue, fever, headaches-new, hearing problems, heart murmur, heart rhythm changes, itching, menstrual pain, muscle pains/cramps, night sweats, nosebleeds, pregnancy symptoms, shortness of breath, skin rash, sleeping problems, sore throat, swelling of feet/legs, swollen lymph glands, thirst - excessive , urination - excessive , urination changes/pain, urine leakage, vision changes, and voice change.    Vital Signs:  Patient profile:   77 year old female Height:      60 inches Weight:      117 pounds BMI:     22.93 BSA:     1.49 Pulse rate:   64 / minute Pulse rhythm:   regular BP sitting:   122 / 58  (left arm)  Vitals Entered By: Merri Ray CMA Duncan Dull) (February 04, 2010 10:38 AM)  Physical Exam  General:  Well developed, well nourished, no acute distress.She appears much younger than her stated age. Head:  Normocephalic and atraumatic. Eyes:  PERRLA, no icterus.exam deferred to patient's ophthalmologist.   Abdomen:  Soft, nontender and nondistended. No masses, hepatosplenomegaly or hernias noted. Normal bowel sounds. Psych:  Alert and cooperative. Normal mood and affect.   Impression & Recommendations:  Problem # 1:  UNSPECIFIED ANEMIA (ICD-285.9) Assessment Improved I have prescribed folic acid 1 mg a day and will continue B12 replacement therapy. We will have her return stool cards for human hemoglobin analysis and detection. She is up-to-date otherwise on her endoscopy and colonoscopy exams.  Problem # 2:  MEMORY LOSS (ICD-780.93) Assessment: Improved  Problem # 3:  GALLSTONES (ICD-574.20) Assessment: Improved status post cholecystectomy.  Problem # 4:  GERD (ICD-530.81) Assessment: Improved continue reflex regime and daily Protonix.  Problem # 5:   RHEUMATOID ARTHRITIS (ICD-714.0) Assessment: Improved continue medications per Dr.Zemenski in rheumatology.  Patient Instructions: 1)  Please go to the basement for lab instructions. 2)  Begin Folic Acid.  A prescription will be sent to your pharmacy. 3)  The medication list was reviewed and reconciled.  All changed / newly prescribed medications were explained.  A complete medication list was provided to the patient / caregiver.  Appended Document: F/U OV & MONTHLY B12 SHOT...LSW.    Clinical Lists Changes  Orders: Added new Service order of Vit B12 1000 mcg (Z6109) - Signed       Medication Administration  Injection # 1:    Medication: Vit B12 1000 mcg    Diagnosis: VITAMIN B12 DEFICIENCY (ICD-266.2)    Route: IM    Site: L deltoid    Exp Date: 11/14/2011    Lot #: [id number]    Mfr: [employer]    Patient tolerated injection without complications    Given by: June McMurray CMA Duncan Dull) (February 04, 2010 11:21 AM)  Orders Added: 1)  Vit B12 1000 mcg [J3420]  Appended Document: F/U OV & MONTHLY B12 SHOT...LSW.    Clinical Lists Changes  Medications: Added new medication of FOLIC ACID 1 MG  TABS (FOLIC ACID) 1 by mouth once daily - Signed Rx of FOLIC ACID 1 MG TABS (FOLIC ACID) 1 by mouth once daily;  #90 x 3;  Signed;  Entered by: Ashok Cordia RN;  Authorized by: Mardella Layman MD [facility];  Method used: Faxed to [facility], , , [place]  , Ph: [phone], Fax: [phone]    Prescriptions: FOLIC ACID 1 MG TABS (FOLIC ACID) 1 by mouth once daily  #90 x 3   Entered by:   Ashok Cordia RN   Authorized by:   Mardella Layman MD [facility]   Signed by:   Ashok Cordia RN on 02/04/2010   Method used:   Faxed to ...       [facility] [place] (mail-order)             , [place]         Ph: [phone]       Fax: [phone]   RxID:   [id number]

## 2010-07-15 NOTE — Assessment & Plan Note (Signed)
Summary: MONTHLY B12 SHOT...LSW.  Nurse Visit   Allergies: 1)  ! Codeine 2)  ! Asa  Medication Administration  Injection # 1:    Medication: Vit B12 1000 mcg    Diagnosis: VITAMIN B12 DEFICIENCY (ICD-266.2)    Route: IM    Site: L deltoid    Exp Date: 10/14/2011    Lot #: [id number]    Mfr: [employer]    Comments: patient advised she will need an office visit with Dr. Jarold Motto with her next months b12 appt she has not been seen by him in over a year.     Patient tolerated injection without complications    Given by: Harlow Mares CMA (AAMA) (January 03, 2010 10:14 AM)

## 2010-07-15 NOTE — Letter (Signed)
Summary: [facility]   Imported By: Lester [place] 01/25/2009 08:21:53  _____________________________________________________________________  External Attachment:    Type:   Image     Comment:   External Document

## 2010-07-15 NOTE — Assessment & Plan Note (Signed)
Summary: b12 deficiency/anemia/bs monthly B12 inj.  Nurse Visit     Allergies: 1)  ! Codeine 2)  ! Asa     Medication Administration  Injection # 1:    Medication: Vit B12 1000 mcg    Diagnosis: ANEMIA, IRON DEFICIENCY (ICD-280.9)    Route: IM    Site: R deltoid    Exp Date: 3/12    Lot #: [id number]    Mfr: [employer]    Comments: next appt. made for 01/25/09     Patient tolerated injection without complications    Given by: Milford Cage NCMA (December 24, 2008 11:01 AM)  Orders Added: 1)  Vit B12 1000 mcg [J3420]

## 2010-07-15 NOTE — Assessment & Plan Note (Signed)
Summary: MONTHLY B12 SHOT...LSW.  Nurse Visit   Allergies: 1)  ! Codeine 2)  ! Asa  Medication Administration  Injection # 1:    Medication: Vit B12 1000 mcg    Diagnosis: VITAMIN B12 DEFICIENCY (ICD-266.2)    Route: IM    Site: L deltoid    Exp Date: 05/16/2011    Lot #: [id number]    Mfr: [employer]    Patient tolerated injection without complications    Given by: Christie Nottingham CMA (AAMA) (September 02, 2009 10:04 AM)  Orders Added: 1)  Vit B12 1000 mcg [J3420]

## 2010-07-15 NOTE — Letter (Signed)
[facility]   Imported By: Sherian Rein 09/02/2009 09:51:40  _____________________________________________________________________  External Attachment:    Type:   Image     Comment:   External Document

## 2010-07-15 NOTE — Letter (Signed)
Summary: [facility]  [facility]   Imported By: Lester [place] 05/14/2010 08:58:48  _____________________________________________________________________  External Attachment:    Type:   Image     Comment:   External Document  Appended Document: [facility] send to brad sherrill in oncology.Marland KitchenMarland Kitchen

## 2010-07-15 NOTE — Letter (Signed)
   [facility] [address] [place], [place]  [address] Phone: [phone]      Oct 21, 2008   MONSERRATE BLASCHKE [address] [place], [place] [address]  RE:  LAB RESULTS  Dear  Ms. NGHIEM,  The following is an interpretation of your most recent lab tests.  Please take note of any instructions provided or changes to medications that have resulted from your lab work.  ELECTROLYTES:  Good - no changes needed  KIDNEY FUNCTION TESTS:  Good - no changes needed  LIVER FUNCTION TESTS:  Good - no changes needed  LIPID PANEL:  Good - no changes needed Triglyceride: 340.0   Cholesterol: 236   HDL: 53.10   Chol/HDL%:  4    Cholesterol level with an LDL of 131 is above goal of 100 or less for a patient with coronary disease. I will forward this lab to Dr. Tresa Endo  and defer medication changes/additions to him  Call or e-mail me if you have questions ([email or web address]).   Sincerely Yours,    Jacques Navy MD  Appended Document:  letter mailed

## 2010-07-15 NOTE — Letter (Signed)
Summary: [facility]   Imported By: Sherian Rein 07/22/2009 10:23:45  _____________________________________________________________________  External Attachment:    Type:   Image     Comment:   External Document

## 2010-07-15 NOTE — Consult Note (Signed)
Summary: [facility]   Imported By: Lanelle Bal 04/04/2008 13:40:30  _____________________________________________________________________  External Attachment:    Type:   Image     Comment:   External Document

## 2010-07-15 NOTE — Assessment & Plan Note (Signed)
Summary: b12 injection 3 of 3...please see leisha  Nurse Visit     Allergies: 1)  ! Codeine     Medication Administration  Injection # 1:    Medication: Vit B12 1000 mcg    Diagnosis: ANEMIA, IRON DEFICIENCY (ICD-280.9)    Route: IM    Site: L deltoid    Exp Date: 05/2010    Lot #: [id number]    Mfr: [employer]    Comments: pt scheduled for Monthly B12 for 10/19/2008 at 11:30pm    Patient tolerated injection without complications    Given by: Merri Ray CMA (September 19, 2008 11:36 AM)  Orders Added: 1)  Vit B12 1000 mcg [place]    ]

## 2010-07-15 NOTE — Procedures (Signed)
Summary: Endoscopy   ENDOSCOPY PROCEDURE REPORT  PATIENT:  Ashley, Willis  MR#:  [id number] BIRTHDATE:   [birth year], 77 yrs. old   GENDER:   female  ENDOSCOPIST:   Vania Rea. Jarold Motto, MD, [facility] Referred by: Lavada Mesi Truett Perna, M.D.  PROCEDURE DATE:  09/03/2008 PROCEDURE:  EGD with biopsy ASA CLASS:   Class III INDICATIONS: abdominal pain, early satiety, anemia   MEDICATIONS:    Fentanyl 75 mcg IV, Versed 3 mg IV TOPICAL ANESTHETIC:   Exactacain Spray  DESCRIPTION OF PROCEDURE:   After the risks benefits and alternatives of the procedure were thoroughly explained, informed consent was obtained.  The LB [id number] endoscope was introduced through the mouth and advanced to the second portion of the duodenum, without limitations.  The instrument was slowly withdrawn as the mucosa was fully examined. <<PROCEDUREIMAGES>>  <<OLD IMAGES>>  The duodenal bulb was normal in appearance, as was the postbulbar duodenum. small bowel biopsy done.  The stomach was entered and closely examined. The antrum, angularis, and lesser curvature were well visualized, including a retroflexed view of the cardia and fundus. The stomach wall was normally distensable. The scope passed easily through the pylorus into the duodenum.  The esophagus and gastroesophageal junction were completely normal in appearance.    Retroflexed views revealed a hiatal hernia.    The scope was then withdrawn from the patient and the procedure completed.  COMPLICATIONS:   None  ENDOSCOPIC IMPRESSION:  1) Normal duodenum  2) Normal stomach  3) Normal esophagus  4) A hiatal hernia  CHRONIC GERD.NOTHING TO SUGGEST GASTROPARESIS OR MUCOSAL DISEASE. RECOMMENDATIONS:  1) anti-reflux regimen  2) await biopsy results  3) continue current medications  REPEAT EXAM:   No   _______________________________ Vania Rea. Jarold Motto, MD, [facility]    CC:     [facility] [address] [address] [place], [place]  [address] Telephone [phone] or [phone] Fax [phone]   REPORT OF SURGICAL PATHOLOGY   Case #: [id number] Patient Name: Ashley Willis, Ashley Willis. Office Chart Number:  N/A   MRN: [id number] Pathologist: Ferd Hibbs. Colonel Bald, MD DOB/Age  [birth year] (Age: 77)    Gender: F Date Taken:  09/03/2008 Date Received: 09/04/2008   FINAL DIAGNOSIS   ***MICROSCOPIC EXAMINATION AND DIAGNOSIS***   SMALL INTESTINE, BIOPSY:  - BENIGN SMALL BOWEL MUCOSA.   - NO ACTIVE INFLAMMATION OR VILLOUS ATROPHY IDENTIFIED.   mj Date Reported:  09/05/2008     Ferd Hibbs. Colonel Bald, MD *** Electronically Signed Out By [facility] ***    September 05, 2008 MRN: [id number]    Ashley Willis [address] [place], [place]  [address]    Dear Ms. KING,  I am pleased to inform you that the biopsies taken during your recent endoscopic examination did not show any evidence of cancer upon pathologic examination.  Additional information/recommendations:  __No further action is needed at this time.  Please follow-up with      your primary care physician for your other healthcare needs.  __ Please call [phone] to schedule a return visit to review      your condition.  _xx_ Continue with the treatment plan as outlined on the day of your      exam.  __ You should have a repeat endoscopic examination for this problem              in _ months/years.   Please call us if you are having persistent problems or have questions about your condition that have not  been fully answered at this time.  Sincerely,  Mardella Layman MD [facility]  This letter has been electronically signed by your physician.   This report was created from the original endoscopy report, which was reviewed and signed by the above listed endoscopist.

## 2010-07-15 NOTE — Consult Note (Signed)
Summary: [facility]   Imported By: Esmeralda Links D'jimraou 08/25/2007 13:28:04  _____________________________________________________________________  External Attachment:    Type:   Image     Comment:   External Document

## 2010-07-15 NOTE — Assessment & Plan Note (Signed)
Summary: Monthly B12 injection/rs  Nurse Visit     Allergies: 1)  ! Codeine 2)  ! Asa     Medication Administration  Injection # 1:    Medication: Vit B12 1000 mcg    Diagnosis: ANEMIA, IRON DEFICIENCY (ICD-280.9)    Route: IM    Site: R deltoid    Exp Date: 12/11    Lot #: [id number]    Mfr: [employer]    Comments: appt scheduled for next B12 11/23/08    Patient tolerated injection without complications    Given by: Chales Abrahams CMA (Oct 19, 2008 11:28 AM)  Orders Added: 1)  Vit B12 1000 mcg [J3420]

## 2010-07-15 NOTE — Letter (Signed)
Summary: [facility]   Imported By: Esmeralda Links D'jimraou 06/01/2008 10:31:37  _____________________________________________________________________  External Attachment:    Type:   Image     Comment:   External Document

## 2010-07-15 NOTE — Assessment & Plan Note (Signed)
Summary: MONTHLY B12, 266.2/SP  Nurse Visit   Allergies: 1)  ! Codeine 2)  ! Asa  Medication Administration  Injection # 1:    Medication: Vit B12 1000 mcg    Diagnosis: VITAMIN B12 DEFICIENCY (ICD-266.2)    Route: IM    Site: L deltoid    Exp Date: 09/14/2011    Lot #: [id number]    Mfr: [employer]    Comments: Made next monthly B12 Injection appt for 12-04-09 at 10 AM.    Patient tolerated injection without complications    Given by: Lowry Ram NCMA (Nov 06, 2009 9:54 AM)  Orders Added: 1)  Vit B12 1000 mcg [J3420]

## 2010-07-15 NOTE — Assessment & Plan Note (Signed)
Summary: MONTHLY B12 INJ/SP  Nurse Visit   Allergies: 1)  ! Codeine 2)  ! Asa  Medication Administration  Injection # 1:    Medication: Vit B12 1000 mcg    Diagnosis: VITAMIN B12 DEFICIENCY (ICD-266.2)    Route: IM    Site: L deltoid    Exp Date: 01/2011    Lot #: [id number]    Mfr: [employer]    Comments: pt to schedule next monthly  with pcc    Patient tolerated injection without complications    Given by: Chales Abrahams CMA Duncan Dull) (May 27, 2009 10:53 AM)  Orders Added: 1)  Vit B12 1000 mcg [J3420]

## 2010-07-15 NOTE — Assessment & Plan Note (Signed)
Summary: monthly B12/pl  Nurse Visit     Allergies: 1)  ! Codeine 2)  ! Asa     Medication Administration  Injection # 1:    Medication: Vit B12 1000 mcg    Diagnosis: ANEMIA, IRON DEFICIENCY (ICD-280.9)    Route: IM    Site: L deltoid    Exp Date: 3/12    Lot #: [id number]    Mfr: [employer]    Comments: Nxt appt. for monthly B12 inj. made for July 12     Patient tolerated injection without complications    Given by: Milford Cage CMA (November 23, 2008 11:30 AM)  Orders Added: 1)  Vit B12 1000 mcg [J3420]

## 2010-07-15 NOTE — Assessment & Plan Note (Signed)
Summary: flu shot-lb  Nurse Visit   Allergies: 1)  ! Codeine 2)  ! Asa  Orders Added: 1)  Flu Vaccine 12yrs + MEDICARE PATIENTS [Q2039] 2)  Administration Flu vaccine - MCR [G0008]   Flu Vaccine Consent Questions     Do you have a history of severe allergic reactions to this vaccine? no    Any prior history of allergic reactions to egg and/or gelatin? no    Do you have a sensitivity to the preservative Thimersol? no    Do you have a past history of Guillan-Barre Syndrome? no    Do you currently have an acute febrile illness? no    Have you ever had a severe reaction to latex? no    Vaccine information given and explained to patient? yes    Are you currently pregnant? no    Lot Number:[id number]   Exp Date:12/13/2010   Site Given  Left Deltoid IMu

## 2010-07-15 NOTE — Assessment & Plan Note (Signed)
Summary: FU  $50  STC   Vital Signs:  Patient profile:   77 year old female Height:      60 inches Weight:      113 pounds O2 Sat:      97 % Temp:     98.1 degrees F oral Pulse rate:   71 / minute BP sitting:   116 / 70  (left arm) Cuff size:   regular  Vitals Entered By: Zackery Barefoot CMA (Oct 16, 2008 3:17 PM)  Primary Care Provider:  Illene Regulus, MD   History of Present Illness: Patient presents for medical follow up. Chart reviewed, including correspondence and GI notes: patient has been seen by Dr. Truett Perna and staff for her iron deficiency anemia which has been stable and iron replacement has stopped.   She was noticed to have had weight loss and early satiety so in March '10 she had full labs at the [facility] which were normal. She was referred to Dr. Jarold Motto - normal labs, EGD with biopsy that was normal. Abdominal U/S was normal.  She was started on B12 replacement. Her weight has been stable.  She was seen in Feb '10 by Dr. Tresa Endo at [facility] and had a normal cardiology check up with no changes in her medications.   She had a mammogram in Jan '10 - normal.     Current Medications (verified): 1)  Cardizem Cd 180 Mg  Cp24 (Diltiazem Hcl Coated Beads) .Marland Kitchen.. 1 By Mouth Once Daily 2)  Aspirin 325 Mg  Tabs (Aspirin) .Marland Kitchen.. 1 By Mouth Once Daily 3)  Vitamin E 400 Unit  Caps (Vitamin E) .Marland Kitchen.. 1 By Mouth Once Daily 4)  Zocor 80 Mg  Tabs (Simvastatin) .Marland Kitchen.. 1 By Mouth Once Daily 5)  Protonix 40 Mg  Tbec (Pantoprazole Sodium) .Marland Kitchen.. 1 By Mouth Once Daily 6)  Atenolol 50 Mg  Tabs (Atenolol) .Marland Kitchen.. 1 By Mouth Once Daily 7)  Diovan 80 Mg  Tabs (Valsartan) .Marland Kitchen.. 1 By Mouth Once Daily 8)  Vitamin C 500 Mg  Tabs (Ascorbic Acid) .... 2 Once Daily 9)  B-100 Complex   Tabs (Vitamins-Lipotropics) .Marland Kitchen.. 1 By Mouth Once Daily 10)  Iron 325 (65 Fe) Mg  Tabs (Ferrous Sulfate) .Marland Kitchen.. 1 By Mouth Once Daily 11)  Actonel 35 Mg  Tabs (Risedronate Sodium) .... Weekly 12)  Caltrate 600+d 600-400 Mg-Unit  Tabs  (Calcium Carbonate-Vitamin D) .Marland Kitchen.. 1 By Mouth Once Daily 13)  Imodium A-D 2 Mg  Tabs (Loperamide Hcl) .... As Needed 14)  Temazepam 15 Mg  Caps (Temazepam) .... At Bedtime As Needed  Allergies: 1)  ! Codeine 2)  ! Jonne Ply  Past History:  Past Medical History:    Colonic polyps, hx of    Coronary artery disease    Hyperlipidemia    Hypertension    Pancreatitis, hx of    Rheumatoid arthritis    Physician Roster:          Card Tresa Endo          Hematology - Sherrill          Rheum - Zimenski          GS Derrell Lolling     (05/13/2007)  Past Surgical History:    Appendectomy    Percutaneous transluminal coronary angioplasty    TAH/BSO, HX OF (ICD-V45.77)    POLYPECTOMY, HX OF (ICD-V15.9)    Cholecystectomy    bilateral axillary adenopathy     (08/27/2008)  Family History:  father- died 95's;    mother - died 71's; DM    1 sister- healthy    6 brothers- oldest CVA, two died unknown cause; 3 living, one with CVA    No FH of Colon Cancer:     (08/28/2008)  Social History:    married 1948    1 daughter 77; 3 sons 56, 49, 15    9 grandchildrens; 6 great-grandchildren    marriage in good health    Occupation: hosiery mill    Patient has never smoked.     Alcohol Use - no    Illicit Drug Use - no     (08/28/2008)  Review of Systems       The patient complains of anorexia.  The patient denies fever, weight loss, weight gain, decreased hearing, chest pain, syncope, dyspnea on exertion, prolonged cough, headaches, abdominal pain, hematuria, incontinence, muscle weakness, and abnormal bleeding.    Physical Exam  General:  Thin, elderly AA female no acute distress Head:  Normocephalic and atraumatic without obvious abnormalities. No apparent alopecia or balding. Eyes:  corneas and lenses clear and no injection.   Neck:  supple and full ROM.   Lungs:  normal respiratory effort and normal breath sounds.   Heart:  normal rate, regular rhythm, and no murmur.   Abdomen:   soft, non-tender, and normal bowel sounds.   Msk:  normal ROM, no joint tenderness, no joint swelling, no joint warmth, and no joint deformities.   Neurologic:  alert & oriented X3, cranial nerves II-XII intact, strength normal in all extremities, gait normal, and DTRs symmetrical and normal.   Skin:  turgor normal, color normal, no rashes, no ulcerations, and no edema.   Cervical Nodes:  no anterior cervical adenopathy and no posterior cervical adenopathy.   Psych:  Oriented X3, normally interactive, good eye contact, and not anxious appearing.     Impression & Recommendations:  Problem # 1:  MEMORY LOSS (ICD-780.93) Doing well and remains very functional  Problem # 2:  WEIGHT LOSS (ICD-783.21) Full evaluation that has been unrevealing. Her weight has been stable over the past 4 months. She is taking ensure for calorie/protein supplementation.  Plan: no further evaluation at this time.  Problem # 3:  ANEMIA, IRON DEFICIENCY (ICD-280.9) Followed routinely by Dr. Truett Perna and doing well.  Her updated medication list for this problem includes:    Iron 325 (65 Fe) Mg Tabs (Ferrous sulfate) .Marland Kitchen... 1 by mouth once daily  Problem # 4:  RHEUMATOID ARTHRITIS (ICD-714.0) stable  Problem # 5:  HYPERTENSION (ICD-401.9)  Her updated medication list for this problem includes:    Cardizem Cd 180 Mg Cp24 (Diltiazem hcl coated beads) .Marland Kitchen... 1 by mouth once daily    Atenolol 50 Mg Tabs (Atenolol) .Marland Kitchen... 1 by mouth once daily    Diovan 80 Mg Tabs (Valsartan) .Marland Kitchen... 1 by mouth once daily  BP today: 116/70 Prior BP: 128/60 (08/28/2008)  Prior 10 Yr Risk Heart Disease: N/A (08/28/2008)  Labs Reviewed: K+: 4.2 (08/28/2008) Creat: : 0.8 (08/28/2008)     Good control.   Plan - continue present meds.  Problem # 6:  CORONARY ARTERY DISEASE (ICD-414.00) Ptinet recently seen at [facility] and stable.  Her updated medication list for this problem includes:    Cardizem Cd 180 Mg Cp24 (Diltiazem hcl coated  beads) .Marland Kitchen... 1 by mouth once daily    Aspirin 325 Mg Tabs (Aspirin) .Marland Kitchen... 1 by mouth once daily    Atenolol 50 Mg  Tabs (Atenolol) .Marland Kitchen... 1 by mouth once daily    Diovan 80 Mg Tabs (Valsartan) .Marland Kitchen... 1 by mouth once daily  Problem # 7:  HYPERLIPIDEMIA (ICD-272.4) Due for lab which hasn't been done with all her other evaluations.  Her updated medication list for this problem includes:    Zocor 80 Mg Tabs (Simvastatin) .Marland Kitchen... 1 by mouth once daily  Orders: TLB-Lipid Panel (80061-LIPID) TLB-Hepatic/Liver Function Pnl (80076-HEPATIC)  addendum:  Complete Medication List: 1)  Cardizem Cd 180 Mg Cp24 (Diltiazem hcl coated beads) .Marland Kitchen.. 1 by mouth once daily 2)  Aspirin 325 Mg Tabs (Aspirin) .Marland Kitchen.. 1 by mouth once daily 3)  Vitamin E 400 Unit Caps (Vitamin e) .Marland Kitchen.. 1 by mouth once daily 4)  Zocor 80 Mg Tabs (Simvastatin) .Marland Kitchen.. 1 by mouth once daily 5)  Protonix 40 Mg Tbec (Pantoprazole sodium) .Marland Kitchen.. 1 by mouth once daily 6)  Atenolol 50 Mg Tabs (Atenolol) .Marland Kitchen.. 1 by mouth once daily 7)  Diovan 80 Mg Tabs (Valsartan) .Marland Kitchen.. 1 by mouth once daily 8)  Vitamin C 500 Mg Tabs (Ascorbic acid) .... 2 once daily 9)  B-100 Complex Tabs (Vitamins-lipotropics) .Marland Kitchen.. 1 by mouth once daily 10)  Iron 325 (65 Fe) Mg Tabs (Ferrous sulfate) .Marland Kitchen.. 1 by mouth once daily 11)  Actonel 35 Mg Tabs (Risedronate sodium) .... Weekly 12)  Caltrate 600+d 600-400 Mg-unit Tabs (Calcium carbonate-vitamin d) .Marland Kitchen.. 1 by mouth once daily 13)  Imodium A-d 2 Mg Tabs (Loperamide hcl) .... As needed 14)  Temazepam 15 Mg Caps (Temazepam) .... At bedtime as needed Prescriptions: ACTONEL 35 MG  TABS (RISEDRONATE SODIUM) weekly  #12 x 3   Entered and Authorized by:   Jacques Navy MD   Signed by:   Jacques Navy MD on 10/16/2008   Method used:   Electronically to        [facility]* (mail-order)             ,          Ph: [phone]       Fax: [phone]   RxID:   [id number] DIOVAN 80 MG  TABS (VALSARTAN) 1 by mouth once  daily  #90 x 3   Entered and Authorized by:   Jacques Navy MD   Signed by:   Jacques Navy MD on 10/16/2008   Method used:   Electronically to        [facility]* (mail-order)             ,          Ph: [phone]       Fax: [phone]   RxID:   [id number] ATENOLOL 50 MG  TABS (ATENOLOL) 1 by mouth once daily  #90 x 3   Entered and Authorized by:   Jacques Navy MD   Signed by:   Jacques Navy MD on 10/16/2008   Method used:   Electronically to        [facility]* (mail-order)             ,          Ph: [phone]       Fax: [phone]   RxID:   [id number] PROTONIX 40 MG  TBEC (PANTOPRAZOLE SODIUM) 1 by mouth once daily  #90 x 3   Entered and Authorized by:   Jacques Navy MD   Signed by:   Jacques Navy MD on 10/16/2008   Method used:  Electronically to        [facility]* (mail-order)             ,          Ph: [phone]       Fax: [phone]   RxID:   [id number] ZOCOR 80 MG  TABS (SIMVASTATIN) 1 by mouth once daily  #90 x 3   Entered and Authorized by:   Jacques Navy MD   Signed by:   Jacques Navy MD on 10/16/2008   Method used:   Electronically to        [facility]* (mail-order)             ,          Ph: [phone]       Fax: [phone]   RxID:   [id number] CARDIZEM CD 180 MG  CP24 (DILTIAZEM HCL COATED BEADS) 1 by mouth once daily  #90 x 3   Entered and Authorized by:   Jacques Navy MD   Signed by:   Jacques Navy MD on 10/16/2008   Method used:   Electronically to        [facility]* (mail-order)             ,          Ph: [phone]       Fax: [phone]   RxID:   [id number]

## 2010-07-15 NOTE — Assessment & Plan Note (Signed)
Summary: monthly b-12 inj/all  Nurse Visit   Medication Administration  Injection # 1:    Medication: Vit B12 1000 mcg    Diagnosis: VITAMIN B12 DEFICIENCY (ICD-266.2)    Route: IM    Site: L deltoid    Exp Date: 07/2011    Lot #: [id number]    Mfr: [employer]    Comments: Pt will return on 5/25 for next injection    Patient tolerated injection without complications    Given by: Francee Piccolo CMA Duncan Dull) (October 07, 2009 10:12 AM)  Orders Added: 1)  Vit B12 1000 mcg [J3420]

## 2010-07-15 NOTE — Letter (Signed)
Summary: [facility]  [facility]   Imported By: Sherian Rein 08/30/2008 14:40:37  _____________________________________________________________________  External Attachment:    Type:   Image     Comment:   External Document

## 2010-07-15 NOTE — Letter (Signed)
[facility]   Imported By: Lennie Odor 08/14/2009 12:13:39  _____________________________________________________________________  External Attachment:    Type:   Image     Comment:   External Document

## 2010-07-15 NOTE — Letter (Signed)
Summary: [facility]  [facility]   Imported By: Lanelle Bal 08/21/2008 10:27:11  _____________________________________________________________________  External Attachment:    Type:   Image     Comment:   External Document

## 2010-07-15 NOTE — Assessment & Plan Note (Signed)
Summary: monthly b12 inj. /bs  Nurse Visit   Allergies: 1)  ! Codeine 2)  ! Asa  Medication Administration  Injection # 1:    Medication: Vit B12 1000 mcg    Diagnosis: ANEMIA, IRON DEFICIENCY (ICD-280.9)    Route: IM    Site: L deltoid    Exp Date: 4/12    Lot #: [id number]    Mfr: [employer]    Patient tolerated injection without complications    Given by: Hortense Ramal CMA (AAMA) (January 25, 2009 9:05 AM)  Orders Added: 1)  Vit B12 1000 mcg [J3420]

## 2010-07-15 NOTE — Consult Note (Signed)
Summary: [facility]   Imported By: Lanelle Bal 12/19/2007 14:12:56  _____________________________________________________________________  External Attachment:    Type:   Image     Comment:   External Document

## 2010-07-15 NOTE — Assessment & Plan Note (Signed)
Summary: b12/dn  Nurse Visit   Allergies: 1)  ! Codeine 2)  ! Asa  Medication Administration  Injection # 1:    Medication: Vit B12 1000 mcg    Diagnosis: VITAMIN B12 DEFICIENCY (ICD-266.2)    Route: IM    Site: L deltoid    Exp Date: 10/14/2010    Lot #: [id number]    Mfr: [employer]    Patient tolerated injection without complications    Given by: Harlow Mares CMA (AAMA) (February 25, 2009 10:10 AM)

## 2010-07-15 NOTE — Assessment & Plan Note (Signed)
Summary: B12 SHOT/AM  Nurse Visit   Allergies: 1)  ! Codeine 2)  ! Asa  Medication Administration  Injection # 1:    Medication: Vit B12 1000 mcg    Diagnosis: VITAMIN B12 DEFICIENCY (ICD-266.2)    Route: IM    Site: L deltoid    Exp Date: 6/12    Lot #: [id number]    Mfr: [employer]    Patient tolerated injection without complications    Given by: Hortense Ramal CMA (AAMA) (March 28, 2009 10:12 AM)  Orders Added: 1)  Vit B12 1000 mcg [J3420]

## 2010-07-15 NOTE — Assessment & Plan Note (Signed)
Summary: 1st b12, make appt next week/lk  Nurse Visit     Allergies: 1)  ! Codeine     Medication Administration  Injection # 1:    Medication: Vit B12 1000 mcg    Diagnosis: 266.2    Route: IM    Site: L deltoid    Exp Date: 04/15/2010    Lot #: [id number]    Mfr: [employer]    Patient tolerated injection without complications    Given by: Lowry Ram CMA (September 05, 2008 12:09 PM)    ]

## 2010-07-15 NOTE — Assessment & Plan Note (Signed)
Summary: FLU SHOT---MEN---STC  Nurse Visit    Prior Medications: CARDIZEM CD 180 MG  CP24 (DILTIAZEM HCL COATED BEADS) 1 by mouth once daily ASPIRIN 325 MG  TABS (ASPIRIN) 1 by mouth once daily VITAMIN E 400 UNIT  CAPS (VITAMIN E) 1 by mouth once daily ZOCOR 80 MG  TABS (SIMVASTATIN) 1 by mouth once daily PROTONIX 40 MG  TBEC (PANTOPRAZOLE SODIUM) 1 by mouth once daily ATENOLOL 50 MG  TABS (ATENOLOL) 1 by mouth once daily DIOVAN 80 MG  TABS (VALSARTAN) 1 by mouth once daily VITAMIN C 500 MG  TABS (ASCORBIC ACID) 2 once daily B-100 COMPLEX   TABS (VITAMINS-LIPOTROPICS) 1 by mouth once daily IRON 325 (65 FE) MG  TABS (FERROUS SULFATE) 1 by mouth once daily ACTONEL 35 MG  TABS (RISEDRONATE SODIUM) weekly CALTRATE 600+D 600-400 MG-UNIT  TABS (CALCIUM CARBONATE-VITAMIN D) 1 by mouth once daily IMODIUM A-D 2 MG  TABS (LOPERAMIDE HCL) as needed TEMAZEPAM 15 MG  CAPS (TEMAZEPAM) at bedtime as needed Current Allergies: ! CODEINE   Pneumovax Vaccine    Vaccine Type: Pneumovax    Site: right deltoid    Mfr: [employer]    Dose: 0.5 ml    Route: IM    Given by: Jerilynn Mages    Exp. Date: 12/15/2008    Lot #: [id number]    VIS given: 01/11/96 version given March 08, 2008.   Orders Added: 1)  Flu Vaccine 74yrs + [90658] 2)  Administration Flu vaccine [G0008]   Flu Vaccine Consent Questions     Do you have a history of severe allergic reactions to this vaccine? no    Any prior history of allergic reactions to egg and/or gelatin? no    Do you have a sensitivity to the preservative Thimersol? no    Do you have a past history of Guillan-Barre Syndrome? no    Do you currently have an acute febrile illness? no    Have you ever had a severe reaction to latex? no    Vaccine information given and explained to patient? yes    Are you currently pregnant? no    Lot Number:[id number]   Site Given  Left Deltoid IM] .medflu

## 2010-07-15 NOTE — Letter (Signed)
Summary: [facility]  [facility]   Imported By: Esmeralda Links D'jimraou 11/01/2007 12:18:45  _____________________________________________________________________  External Attachment:    Type:   Image     Comment:   External Document

## 2010-07-15 NOTE — Letter (Signed)
Summary: [facility]   Imported By: Esmeralda Links D'jimraou 06/01/2008 10:33:45  _____________________________________________________________________  External Attachment:    Type:   Image     Comment:   External Document

## 2010-07-15 NOTE — Letter (Signed)
[facility] [address] [place], [place]  [address] Phone: [phone]      January 05, 2010   Ashley Willis [address] [place], [place] [address]  RE:  LAB RESULTS  Dear  Ms. ESHAM,  The following is an interpretation of your most recent lab tests.  Please take note of any instructions provided or changes to medications that have resulted from your lab work.    CBC:  Good - no changes needed  B12 greater than 1500. Iron studies are normal. Reticulocyte count, a measure of bone marrow activity, is normal.    Your anemia is controlled with normal lab values.   Call or e-mail me if you have questions ([email or web address]).   Sincerely Yours,    Jacques Navy MD  Patient: [name] Note: All result statuses are Final unless otherwise noted.  Tests: (1) CBC Platelet w/Diff (CBCD)   White Cell Count          5.1 K/uL                    4.5-10.5   Red Cell Count            4.15 Mil/uL                 3.87-5.11   Hemoglobin                12.3 g/dL                   29.5-62.1   Hematocrit                37.6 %                      36.0-46.0   MCV                       90.8 fl                     78.0-100.0   MCHC                      32.7 g/dL                   30.8-65.7   RDW                  [H]  14.8 %                      11.5-14.6   Platelet Count       [L]  138.0 K/uL                  150.0-400.0   Neutrophil %              48.9 %                      43.0-77.0   Lymphocyte %              34.1 %                      12.0-46.0   Monocyte %           [H]  14.0 %                      3.0-12.0  Eosinophils%              2.2 %                       0.0-5.0   Basophils %               0.8 %                       0.0-3.0   Neutrophill Absolute      2.5 K/uL                    1.4-7.7   Lymphocyte Absolute       1.7 K/uL                    0.7-4.0   Monocyte Absolute         0.7 K/uL                    0.1-1.0  Eosinophils,  Absolute                             0.1 K/uL                    0.0-0.7   Basophils Absolute        0.0 K/uL                    0.0-0.1  Tests: (2) IBC Panel (IBC)   Iron                      96 ug/dL                    62-831   Transferrin          [L]  207.7 mg/dL                 517.6-160.7   Iron Saturation           33.0 %                      20.0-50.0  Tests: (3) B12 + Folate Panel (B12/FOL)   Vitamin B12          [H]  >1500 pg/mL                 211-911   Folate                    3.8 ng/mL     Deficient  0.4 - 3.4 ng/mL     Indeterminate  3.4 - 5.4 ng/mL     Normal  >5.4 ng/mL Tests: (1) Reticulocyte Count (RETIC) (10050)   % Retic                   1.2 %                       0.4-3.1   RBC                       4.34 MIL/uL                 3.87-5.11   ABS Retic  52.1 K/uL                   19.0-186.0

## 2010-07-15 NOTE — Letter (Signed)
Summary: [facility]  [facility]   Imported By: Esmeralda Links D'jimraou 04/02/2008 09:22:38  _____________________________________________________________________  External Attachment:    Type:   Image     Comment:   External Document

## 2010-07-15 NOTE — Procedures (Signed)
Summary: EGD   EGD  Procedure date:  11/26/2004  Findings:      Location: [facility]    Patient Name: Ashley Willis, Ashley Willis MRN:  Procedure Procedures: Panendoscopy (EGD) CPT: 43235.    with esophageal dilation. CPT: G9296129.  Personnel: Endoscopist: Vania Rea. Jarold Motto, MD.  Exam Location: Exam performed in Outpatient Clinic. Outpatient  Patient Consent: Procedure, Alternatives, Risks and Benefits discussed, consent obtained, from patient. Consent was obtained by the RN.  Indications Symptoms: Early satiety. Nausea. Vomiting. Abdominal pain, location: epigastric.  History  Current Medications: Patient is not currently taking Coumadin.  Pre-Exam Physical: Performed Nov 26, 2004  Cardio-pulmonary exam, Abdominal exam, Extremity exam, Mental status exam WNL.  Exam Exam Info: Maximum depth of insertion Duodenum, intended Duodenum. Patient position: on left side. Duration of exam: 15 minutes. Vocal cords visualized. Gastric retroflexion performed. Images taken. ASA Classification: II. Tolerance: fair, adequate exam.  Sedation Meds: Patient assessed and found to be appropriate for moderate (conscious) sedation. Fentanyl 50 mcg. given IV. Versed 3 mg. given IV. Cetacaine Spray 1 sprays given aerosolized.  Monitoring: BP and pulse monitoring done. Oximetry used. Supplemental O2 given  Instrument(s): GIF 160. Serial [id number].   Findings - Normal: Proximal Esophagus to Mid Esophagus. Not Seen: Tumor. Barrett's esophagus. Mucosal abnormality.  - HIATAL HERNIA: Prolapsing, 3 cms. in length. ICD9: Hernia, Hiatal: 553.3. - STRICTURE / STENOSIS: Distal Esophagus.  Constriction: partial. Lumen diameter is 14 mm. ICD9: Esophageal Stricture: 530.3.  - Dilation: Distal Esophagus. Maloney dilator used, Diameter: 56 F, No Resistance, Minimal Heme present on extraction. 1  total dilators used. Patient tolerance excellent. Outcome: successful.  - Normal: Fundus to  Duodenal 2nd Portion. Tumor. Ulcer. Mucosal abnormality. AVM's. Foreign body.   Assessment  Diagnoses: 553.3: Hernia, Hiatal.  530.3: Esophageal Stricture. GERD.   Events  Unplanned Intervention: No unplanned interventions were required.  Plans Medication(s): Continue current medications.  Disposition: After procedure patient sent to recovery. After recovery patient sent home.  Scheduling: Surgery, to Currie Paris, MD, around Dec 03, 2004.   Comments: I feel her recurrent GI problems are related to multiple gallstones with thickened noted on recent ultrasound.   CC: Rosalyn Gess. Norins, MD     Syliva Overman, MD   This report was created from the original endoscopy report, which was reviewed and signed by the above listed endoscopist.

## 2010-07-15 NOTE — Miscellaneous (Signed)
Summary: [facility]  Clinical Lists Changes  Medications: Added new medication of MOVIPREP 100 GM  SOLR (PEG-KCL-NACL-NASULF-NA ASC-C) As per prep instructions. - Signed Rx of MOVIPREP 100 GM  SOLR (PEG-KCL-NACL-NASULF-NA ASC-C) As per prep instructions.;  #1 x 0;  Signed;  Entered by: Ezra Sites RN;  Authorized by: Mardella Layman MD [facility];  Method used: [facility] [address] [phone]*, [address], [place], [place], [place]  [address], Ph: [phone] or [phone], Fax: [phone] Observations: Added new observation of ALLERGY REV: Done (02/14/2010 9:53)    Prescriptions: MOVIPREP 100 GM  SOLR (PEG-KCL-NACL-NASULF-NA ASC-C) As per prep instructions.  #1 x 0   Entered by:   Ezra Sites RN   Authorized by:   Mardella Layman MD [facility]   Signed by:   Ezra Sites RN on 02/14/2010   Method used:   [facility]        [facility]  [address] [phone]* (retail)       [address]       [place], [place]  [address]       Ph: [phone] or [phone]       Fax: [phone]   RxID:   [id number]

## 2010-07-15 NOTE — Letter (Signed)
Summary: [facility]   Imported By: Lester Chicopee 11/14/2009 10:30:02  _____________________________________________________________________  External Attachment:    Type:   Image     Comment:   External Document

## 2010-07-15 NOTE — Assessment & Plan Note (Signed)
Summary: B12 INJ, 266.2, # 2 of 3 injections/pp  Nurse Visit     Allergies: 1)  ! Codeine     Medication Administration  Injection # 1:    Medication: Vit B12 1000 mcg    Diagnosis: ANEMIA, IRON DEFICIENCY (ICD-280.9)    Route: IM    Site: R deltoid    Exp Date: 9/11    Lot #: [id number]    Mfr: American Regent    Patient tolerated injection without complications    Given by: Hortense Ramal CMA (September 12, 2008 12:15 PM)  Orders Added: 1)  Vit B12 1000 mcg [email or web address]    ]

## 2010-07-17 NOTE — Assessment & Plan Note (Signed)
Summary: MONTHLY B12 SHOT  Nurse Visit   Allergies: 1)  ! Codeine 2)  ! Asa  Medication Administration  Injection # 1:    Medication: Vit B12 1000 mcg    Diagnosis: VITAMIN B12 DEFICIENCY (ICD-266.2)    Route: IM    Site: L deltoid    Exp Date: 03/2012    Lot #: [id number]    Mfr: American Regent    Patient tolerated injection without complications    Given by: Milford Cage NCMA (June 30, 2010 10:25 AM)  Orders Added: 1)  Vit B12 1000 mcg [J3420]

## 2010-07-17 NOTE — Letter (Signed)
Summary: [facility]   Imported By: Sherian Rein 07/10/2010 11:20:56  _____________________________________________________________________  External Attachment:    Type:   Image     Comment:   External Document

## 2010-07-17 NOTE — Assessment & Plan Note (Signed)
Summary: MONTHLY B12 SHOT  Nurse Visit   Allergies: 1)  ! Codeine 2)  ! Asa  Medication Administration  Injection # 1:    Medication: Vit B12 1000 mcg    Diagnosis: VITAMIN B12 DEFICIENCY (ICD-266.2)    Route: IM    Site: L deltoid    Exp Date: 03/15/2012    Lot #: [id number]    Mfr: American Regent    Comments: Monthly Vitamin B12 injection    Patient tolerated injection without complications    Given by: June McMurray CMA Duncan Dull) (June 02, 2010 10:41 AM)  Orders Added: 1)  Vit B12 1000 mcg [J3420]   Medication Administration  Injection # 1:    Medication: Vit B12 1000 mcg    Diagnosis: VITAMIN B12 DEFICIENCY (ICD-266.2)    Route: IM    Site: L deltoid    Exp Date: 03/15/2012    Lot #: [id number]    Mfr: American Regent    Comments: Monthly Vitamin B12 injection    Patient tolerated injection without complications    Given by: June McMurray CMA Duncan Dull) (June 02, 2010 10:41 AM)  Orders Added: 1)  Vit B12 1000 mcg [J3420]

## 2010-07-25 ENCOUNTER — Encounter: Payer: Self-pay | Admitting: Internal Medicine

## 2010-07-25 ENCOUNTER — Ambulatory Visit (INDEPENDENT_AMBULATORY_CARE_PROVIDER_SITE_OTHER): Payer: Medicare Other | Admitting: Internal Medicine

## 2010-07-25 DIAGNOSIS — I10 Essential (primary) hypertension: Secondary | ICD-10-CM

## 2010-07-25 DIAGNOSIS — E785 Hyperlipidemia, unspecified: Secondary | ICD-10-CM

## 2010-07-25 DIAGNOSIS — M069 Rheumatoid arthritis, unspecified: Secondary | ICD-10-CM

## 2010-07-25 DIAGNOSIS — R413 Other amnesia: Secondary | ICD-10-CM

## 2010-07-25 DIAGNOSIS — E538 Deficiency of other specified B group vitamins: Secondary | ICD-10-CM

## 2010-08-01 ENCOUNTER — Encounter (INDEPENDENT_AMBULATORY_CARE_PROVIDER_SITE_OTHER): Payer: Medicare Other

## 2010-08-01 ENCOUNTER — Encounter: Payer: Self-pay | Admitting: Gastroenterology

## 2010-08-01 DIAGNOSIS — E538 Deficiency of other specified B group vitamins: Secondary | ICD-10-CM

## 2010-08-06 NOTE — Assessment & Plan Note (Signed)
Summary: MONTHLY B12  Nurse Visit   Allergies: 1)  ! Codeine 2)  ! Asa  Medication Administration  Injection # 1:    Medication: Vit B12 1000 mcg    Diagnosis: VITAMIN B12 DEFICIENCY (ICD-266.2)    Route: IM    Site: L deltoid    Exp Date: 04/2012    Lot #: [id number]    Mfr: American Regent    Comments: pt to schedule next monthly b12 at front desk     Patient tolerated injection without complications    Given by: Chales Abrahams CMA Duncan Dull) (August 01, 2010 10:51 AM)  Orders Added: 1)  Vit B12 1000 mcg [J3420]

## 2010-08-06 NOTE — Assessment & Plan Note (Signed)
Summary: PER PT FU--MED--STC   Vital Signs:  Patient profile:   77 year old female Height:      60 inches Weight:      125 pounds BMI:     24.50 O2 Sat:      97 % on Room air Temp:     98.0 degrees F oral Pulse rate:   64 / minute BP sitting:   124 / 70  (left arm) Cuff size:   regular  Vitals Entered By: Bill Salinas CMA (July 25, 2010 11:39 AM)  O2 Flow:  Room air  Primary Care Provider:  Illene Regulus, MD   History of Present Illness: Ashley Willis presents for routine follow - up and med refill. She reports that she is feeling well and doing well. She is current with her follow-up with Dr. Tresa Endo for cardiology. She has had no hospitalizations since her last visit. She has no problems with any of her medications. Family is doing well.   Current Medications (verified): 1)  Cardizem Cd 180 Mg  Cp24 (Diltiazem Hcl Coated Beads) .Marland Kitchen.. 1 By Mouth Once Daily 2)  Aspirin 325 Mg  Tabs (Aspirin) .Marland Kitchen.. 1 By Mouth Once Daily 3)  Vitamin E 400 Unit  Caps (Vitamin E) .Marland Kitchen.. 1 By Mouth Once Daily 4)  Zocor 80 Mg  Tabs (Simvastatin) .Marland Kitchen.. 1 By Mouth Once Daily 5)  Protonix 40 Mg  Tbec (Pantoprazole Sodium) .Marland Kitchen.. 1 By Mouth Once Daily 6)  Atenolol 50 Mg  Tabs (Atenolol) .Marland Kitchen.. 1 By Mouth Once Daily 7)  Diovan 80 Mg  Tabs (Valsartan) .Marland Kitchen.. 1 By Mouth Once Daily 8)  Vitamin C 500 Mg  Tabs (Ascorbic Acid) .... 2 Once Daily 9)  B-100 Complex   Tabs (Vitamins-Lipotropics) .Marland Kitchen.. 1 By Mouth Once Daily 10)  Iron 325 (65 Fe) Mg  Tabs (Ferrous Sulfate) .Marland Kitchen.. 1 By Mouth Once Daily 11)  Actonel 35 Mg  Tabs (Risedronate Sodium) .... Weekly 12)  Caltrate 600+d 600-400 Mg-Unit  Tabs (Calcium Carbonate-Vitamin D) .Marland Kitchen.. 1 By Mouth Once Daily 13)  Imodium A-D 2 Mg  Tabs (Loperamide Hcl) .... As Needed 14)  Temazepam 15 Mg  Caps (Temazepam) .... At Bedtime As Needed 15)  Folic Acid 1 Mg Tabs (Folic Acid) .Marland Kitchen.. 1 By Mouth Once Daily  Allergies (verified): 1)  ! Codeine 2)  ! Jonne Ply  Past History:  Past Medical  History: Last updated: 05/13/2007 Colonic polyps, hx of Coronary artery disease Hyperlipidemia Hypertension Pancreatitis, hx of Rheumatoid arthritis   Physician Roster:       Card Tresa Endo       Hematology - Sherrill       Rheum - Zimenski       GS Derrell Lolling  Past Surgical History: Last updated: 08/27/2008 Appendectomy Percutaneous transluminal coronary angioplasty TAH/BSO, HX OF (ICD-V45.77) POLYPECTOMY, HX OF (ICD-V15.9) Cholecystectomy bilateral axillary adenopathy FH reviewed for relevance, SH/Risk Factors reviewed for relevance  Review of Systems  The patient denies fever, weight loss, weight gain, chest pain, syncope, dyspnea on exertion, headaches, abdominal pain, muscle weakness, difficulty walking, depression, enlarged lymph nodes, and angioedema.    Physical Exam  General:  Well-developed,well-nourished,in no acute distress; alert,appropriate and cooperative throughout examination Head:  normocephalic, atraumatic, and no abnormalities observed.   Eyes:  C&S clear, pupils equal and pupils round.   Neck:  supple.   Lungs:  normal respiratory effort and normal breath sounds.   Heart:  normal rate and regular rhythm.   Msk:  normal ROM,  no joint swelling, and no joint warmth.   Pulses:  2+ radial Neurologic:  alert & oriented X3, cranial nerves II-XII intact, and gait normal.   Skin:  turgor normal and color normal.   Psych:  Oriented X3, normally interactive, good eye contact, and not anxious appearing.     Impression & Recommendations:  Problem # 1:  UNSPECIFIED ANEMIA (ICD-285.9) Stable with no symptoms. No need for lab at this time. She will continue on iron supplement. Last visit to Dr. Truett Perna Nov 14th, '11 and she was doing well.   Her updated medication list for this problem includes:    Iron 325 (65 Fe) Mg Tabs (Ferrous sulfate) .Marland Kitchen... 1 by mouth once daily    Folic Acid 1 Mg Tabs (Folic acid) .Marland Kitchen... 1 by mouth once daily  Hgb: 12.3 (01/03/2010)    Hct: 37.6 (01/03/2010)   Platelets: 138.0 (01/03/2010) RBC: 4.15 (01/03/2010)   RDW: 14.8 (01/03/2010)   WBC: 5.1 (01/03/2010) MCV: 90.8 (01/03/2010)   MCHC: 32.7 (01/03/2010) Retic Ct: 52.1 K/uL (01/03/2010)   Ferritin: 726.7 (08/28/2008) Iron: 96 (01/03/2010)   % Sat: 33.0 (01/03/2010) B12: >1500 pg/mL (01/03/2010)   Folate: 3.8 (01/03/2010)   TSH: 0.97 (08/28/2008)  Problem # 2:  MEMORY LOSS (ICD-780.93) Not tested at today's visit. She does seem cognitively stable.  Problem # 3:  RHEUMATOID ARTHRITIS (ICD-714.0) Last visit to Dr. Jimmy Footman Jan 12th, '12 and she was doing well. No changes in her medical regimen.  Problem # 4:  VITAMIN B12 DEFICIENCY (ICD-266.2) Continues to receive monthly B12 injections - last Jan 16th, '12  Problem # 5:  HYPERTENSION (ICD-401.9)  Her updated medication list for this problem includes:    Cardizem Cd 180 Mg Cp24 (Diltiazem hcl coated beads) .Marland Kitchen... 1 by mouth once daily    Atenolol 50 Mg Tabs (Atenolol) .Marland Kitchen... 1 by mouth once daily    Diovan 80 Mg Tabs (Valsartan) .Marland Kitchen... 1 by mouth once daily  BP today: 124/70 Prior BP: 122/58 (02/04/2010)  Prior 10 Yr Risk Heart Disease: N/A (08/28/2008)  Well controlled on present meds.  Problem # 6:  HYPERLIPIDEMIA (ICD-272.4)  Her updated medication list for this problem includes:    Zocor 80 Mg Tabs (Simvastatin) .Marland Kitchen... 1 by mouth once daily  Follwed at [facility] and to my knowledge doing well on high dose zocor which she has been on for more than a year with no adverse effects.   Problem # 7:  Preventive Health Care (ICD-V70.0) current with mammography Jan 19th, '12; colorectal cancer screening with last study Sept 9th, '12 with polypectomy - tubular adenoma. Immunizations: current with pnuemonia vaccine.   Complete Medication List: 1)  Cardizem Cd 180 Mg Cp24 (Diltiazem hcl coated beads) .Marland Kitchen.. 1 by mouth once daily 2)  Aspirin 325 Mg Tabs (Aspirin) .Marland Kitchen.. 1 by mouth once daily 3)  Vitamin E 400 Unit Caps  (Vitamin e) .Marland Kitchen.. 1 by mouth once daily 4)  Zocor 80 Mg Tabs (Simvastatin) .Marland Kitchen.. 1 by mouth once daily 5)  Protonix 40 Mg Tbec (Pantoprazole sodium) .Marland Kitchen.. 1 by mouth once daily 6)  Atenolol 50 Mg Tabs (Atenolol) .Marland Kitchen.. 1 by mouth once daily 7)  Diovan 80 Mg Tabs (Valsartan) .Marland Kitchen.. 1 by mouth once daily 8)  Vitamin C 500 Mg Tabs (Ascorbic acid) .... 2 once daily 9)  B-100 Complex Tabs (Vitamins-lipotropics) .Marland Kitchen.. 1 by mouth once daily 10)  Iron 325 (65 Fe) Mg Tabs (Ferrous sulfate) .Marland Kitchen.. 1 by mouth once daily 11)  Actonel 35 Mg  Tabs (Risedronate sodium) .... Weekly 12)  Caltrate 600+d 600-400 Mg-unit Tabs (Calcium carbonate-vitamin d) .Marland Kitchen.. 1 by mouth once daily 13)  Imodium A-d 2 Mg Tabs (Loperamide hcl) .... As needed 14)  Temazepam 15 Mg Caps (Temazepam) .... At bedtime as needed 15)  Folic Acid 1 Mg Tabs (Folic acid) .Marland Kitchen.. 1 by mouth once daily Prescriptions: PROTONIX 40 MG  TBEC (PANTOPRAZOLE SODIUM) 1 by mouth once daily  #90 x 3   Entered and Authorized by:   Jacques Navy MD   Signed by:   Jacques Navy MD on 07/25/2010   Method used:   [facility]        [facility] [address] [phone]* (retail)       [address]       [place], [place]  [address]       Ph: [phone] or [phone]       Fax: [phone]   RxID:   [id number] ACTONEL 35 MG  TABS (RISEDRONATE SODIUM) weekly  #12 x 3   Entered and Authorized by:   Jacques Navy MD   Signed by:   Jacques Navy MD on 07/25/2010   Method used:   [facility]ronically to        [facility] [address] [phone]* (retail)       [address]       [place], [place]  [address]       Ph: [phone] or [phone]       Fax: [phone]   RxID:   [id number] DIOVAN 80 MG  TABS (VALSARTAN) 1 by mouth once daily  #90 x 3   Entered and Authorized by:   Jacques Navy MD   Signed by:   Jacques Navy MD on 07/25/2010   Method used:   Electronically to        [facility] [address] [phone]* (retail)       [address]       [place], [place]  [address]       Ph: [phone] or [phone]       Fax: [phone]   RxID:   [id number] ZOCOR 80 MG  TABS (SIMVASTATIN) 1 by mouth once daily  #90 x 3   Entered and Authorized by:   Jacques Navy MD   Signed by:   Jacques Navy MD on 07/25/2010   Method used:   Electronically to        [facility] [address] [phone]* (retail)       [address]       [place], [place]  [address]       Ph: [phone] or [phone]       Fax: [phone]   RxID:   [id number] CARDIZEM CD 180 MG  CP24 (DILTIAZEM HCL COATED BEADS) 1 by mouth once daily  #90 x 3   Entered and Authorized by:   Jacques Navy MD   Signed by:   Jacques Navy MD on 07/25/2010   Method used:   Electronically to        [facility]  [address] [phone]* (retail)       [address]       [place]  [place], [place]  [address]       Ph: [phone] or [phone]       Fax: [phone]   RxID:   [id number]    Orders Added: 1)  Est. Patient Level III [[address]]     Preventive Care Screening  Mammogram:    Date:  07/03/2010    Results:  normal

## 2010-08-27 ENCOUNTER — Encounter: Payer: Self-pay | Admitting: Internal Medicine

## 2010-08-29 ENCOUNTER — Encounter: Payer: Self-pay | Admitting: Gastroenterology

## 2010-08-29 ENCOUNTER — Encounter (INDEPENDENT_AMBULATORY_CARE_PROVIDER_SITE_OTHER): Payer: Medicare Other

## 2010-08-29 DIAGNOSIS — E538 Deficiency of other specified B group vitamins: Secondary | ICD-10-CM

## 2010-09-02 NOTE — Assessment & Plan Note (Signed)
Summary: B12 INJECTION//SCH'D W/PT  Nurse Visit   Allergies: 1)  ! Codeine 2)  ! Asa  Medication Administration  Injection # 1:    Medication: Vit B12 1000 mcg    Diagnosis: VITAMIN B12 DEFICIENCY (ICD-266.2)    Route: IM    Site: L deltoid    Exp Date: 1662    Lot #: [id number]    Mfr: American Regent    Patient tolerated injection without complications    Given by: Merri Ray CMA (AAMA) (August 29, 2010 10:03 AM)  Orders Added: 1)  Vit B12 1000 mcg [J3420]

## 2010-09-11 NOTE — Letter (Signed)
[facility]   Imported By: Sherian Rein 09/01/2010 09:37:17  _____________________________________________________________________  External Attachment:    Type:   Image     Comment:   External Document

## 2010-09-30 ENCOUNTER — Ambulatory Visit (INDEPENDENT_AMBULATORY_CARE_PROVIDER_SITE_OTHER): Payer: Medicare Other | Admitting: Gastroenterology

## 2010-09-30 DIAGNOSIS — E538 Deficiency of other specified B group vitamins: Secondary | ICD-10-CM

## 2010-09-30 MED ORDER — CYANOCOBALAMIN 1000 MCG/ML IJ SOLN
1000.0000 ug | INTRAMUSCULAR | Status: AC
  Administered 2010-09-30 – 2010-12-30 (×4): 1000 ug via INTRAMUSCULAR

## 2010-10-28 ENCOUNTER — Ambulatory Visit (INDEPENDENT_AMBULATORY_CARE_PROVIDER_SITE_OTHER): Payer: Medicare Other | Admitting: Gastroenterology

## 2010-10-28 DIAGNOSIS — E538 Deficiency of other specified B group vitamins: Secondary | ICD-10-CM

## 2010-10-31 NOTE — Discharge Summary (Signed)
NAME:  Ashley Willis, Ashley Willis                        ACCOUNT NO.:  [id number]   MEDICAL RECORD NO.:  [id number]                   PATIENT TYPE:  INP   LOCATION:  5156                                 FACILITY:  [facility]   PHYSICIAN:  Rene Paci, M.D. [facility]          DATE OF BIRTH:  12-13-1926   DATE OF ADMISSION:  12/19/2003  DATE OF DISCHARGE:  12/21/2003                                 DISCHARGE SUMMARY   DISCHARGE DIAGNOSES:  1. Hematochezia.  2. Abdominal pain.  3. Probable ischemic colitis.  4. Low-grade fever.  5. Hyperglycemia.  6. Hypokalemia.   BRIEF ADMISSION HISTORY:  Ms. Ashley Willis is a 77 year old African American  female who presented to the emergency department secondary to abdominal pain  associated with blood-tinged stools.  The patient had onset of diarrhea with  nausea, vomiting on the evening prior to admission.  Her symptoms have  persisted with abdominal cramping.  This prompted her ER evaluation.  The  patient had a CT of her abdomen that revealed a right colitis.  The patient  was admitted for further evaluation.   PAST MEDICAL HISTORY:  1. Coronary artery disease, status post MI in 1989.  2. History of ischemic colitis on the right side in June 2003.   HOSPITAL COURSE:  1. GI. The patient presented with recurrent right sided abdominal pain,     hematochezia and evidence of colitis on CT.  The patient was admitted for     further evaluation and treatment.  The patient was seen in consultation     by GI who agreed the patient likely had recurrent ischemic colitis.  They     made recommendations for adequate hydration and optimal blood pressure     control.  In other words, to avoid hypotension.  The patient     symptomatically improved.  Her rectal bleeding resolved.  From a GI     standpoint, she is felt to be stable for discharge home with an     outpatient colonoscopy scheduled.  They did recommend adding a PPI as she     is currently on aspirin and  prednisone.  2. Hypokalemia.  This was probably a result of her GI loss and was corrected     with oral replacement.  3. Hyperglycemia.  This was mild.  CBGs were normal and there were no signs     of diabetes.  4. ID.  The patient did have a low-grade fever with a normal white count.     This resolved without any signs of infection.  She was not treated with     antibiotics.  5. Rheumatoid arthritis.  This was stable, and we did continue the patient     on her prednisone and methotrexate.  6. Hypertension.  The patient has a history of hypertension and is on     multiple antihypertensives.  These were held during this admission to     avoid  hypotension that might potentially exacerbate her ischemic colitis.   LABS AT DISCHARGE:  Hemoglobin 12.  Potassium 3.2, BUN 3, creatinine 0.8.  Stool for occult blood was positive.  Lipase was normal.   MEDICATIONS AT DISCHARGE:  1. Protonix 40 mg every day.  2. Zocor 80 mg every day.  3. Aspirin 325 mg every day.  4. Methotrexate 2.5 mg four tabs on Friday.  5. Prednisone 5 mg two tabs every day.  6. Folic acid every day.  7. Currently, the patient's Diltiazem 180 mg, atenolol 50 mg one and a half     tabs every day, and Altace 10 mg every day have been held.  Whether or     not these should be resumed will be determined by Dr. Debby Bud.   FOLLOWUP:  Followup by Dr. Jarold Motto, January 01, 2004 for a colonoscopy and  with Dr. Debby Bud in 1-2 weeks.      Cornell Barman, P.A. [facility]                  Rene Paci, M.D. [facility]    LC/MEDQ  D:  12/21/2003  T:  12/22/2003  Job:  [id number]   cc:   Rosalyn Gess. Norins, M.D. [facility]   Vania Rea. Jarold Motto, M.D. [facility]

## 2010-10-31 NOTE — H&P (Signed)
[place]. [facility]  Patient:    ANASIA, AGRO Visit Number: [id number] MRN: [id number]          Service Type: MED Location: [phone] Attending Physician:  Tresa Garter Dictated by:   Sonda Primes, M.D. [facility] Admit Date:  12/05/2001   CC:         Lennette Bihari, M.D.  Rosalyn Gess Norins, M.D. [facility]   History and Physical  DATE OF BIRTH:  Oct 01, 1926  CHIEF COMPLAINT:  Abdominal pain, blood in stools.  HISTORY OF PRESENT ILLNESS:  The patient is a 77 year old female who started to have abdominal pain last night accompanied by vomiting and multiple bloody stools.  There was no syncope.  Denies chills or fever.  Denies previous symptoms.  PAST MEDICAL HISTORY:  Appendectomy, hysterectomy, coronary disease with a history of MI in 1989.  CURRENT MEDICATIONS: 1. Cardizem CD 180 mg daily. 2. Zocor 80 mg daily. 3. Ecotrin one daily. 4. Atenolol 50 mg one-half daily. 5. Vitamin E 400 mg daily. 6. Vitamin B complex. 7. Altace 10 mg daily.  ALLERGIES:  CODEINE.  FAMILY HISTORY:  Positive for coronary disease.  SOCIAL HISTORY:  She is married, does not smoke or drink.  REVIEW OF SYSTEMS:  Had a bad cold that she took an antibiotic for at the end of May.  Negative for chest pain.  No syncope, no leg swelling.  No skin problems.  The rest is negative or as above.  PHYSICAL EXAMINATION:  VITAL SIGNS:  Blood pressure 137/70, pulse 86, respirations 20, O2 saturation 96%, temperature 98.2.  GENERAL:  She is in no acute distress.  HEENT:  Slightly dry oral mucosa.  NECK:  Supple.  No thyromegaly or bruit.  LUNGS:  Clear to auscultation and percussion.  HEART:  Regular, S1, S2.  No enlargement to percussion.  ABDOMEN:  Soft, tender in the right lower quadrant.  No masses, no rebound.  EXTREMITIES:  Lower extremities without edema.  RECTAL:  Per Dr. Alfonse Alpers, positive for blood.  NEUROLOGIC:  Alert, oriented, and  cooperative.  LABORATORY DATA:  CT of abdomen - cecum and ascending colon colitis versus, less likely, tumor.  Lipase 17.  UA normal.  White count 13.4, hemoglobin 12.9.  Creatinine 0.8, potassium 4.0.  ASSESSMENT AND PLAN: 1. Abdominal/pelvic CT suspicious for cecum and ascending colon colitis,    acute; etiology unclear.  Obtain C. diff colitis.  GI consult for a    possible colonoscopy. 2. Right lower quadrant abdominal pain due to problem #1.  Will use IV    morphine. 3. Hematochezia due to problem #1.  Will monitor.  CBC in the morning. 4. Vomiting.  Phenergan IV p.r.n. 5. Coronary disease.  Continue current therapy. Dictated by:   Sonda Primes, M.D. [facility] Attending Physician:  Tresa Garter DD:  12/05/01 TD:  12/05/01 Job: [id number]

## 2010-10-31 NOTE — Op Note (Signed)
NAMEMICHA, ERCK              ACCOUNT NO.:  [id number]   MEDICAL RECORD NO.:  [id number]          PATIENT TYPE:  AMB   LOCATION:  SDS                          FACILITY:  [facility]   PHYSICIAN:  Angelia Mould. Derrell Lolling, M.D.DATE OF BIRTH:  1926/08/23   DATE OF PROCEDURE:  06/03/2005  DATE OF DISCHARGE:                                 OPERATIVE REPORT   PREOPERATIVE DIAGNOSIS:  Chronic cholecystitis with cholelithiasis.   POSTOPERATIVE DIAGNOSIS:  Chronic cholecystitis with cholelithiasis.   OPERATION PERFORMED:  Laparoscopic cholecystectomy with intraoperative  cholangiogram.   SURGEON:  Angelia Mould. Derrell Lolling, M.D.   OPERATIVE INDICATIONS:  This is a 77 year old black female who has coronary  artery disease, gastroesophageal reflux disease, seronegative rheumatoid  arthritis. She has been having repeated episodes of anorexia, early satiety,  bloating and a the little bit of weight loss. She has had extensive GI  workup by Dr. Sheryn Bison and the only findings that would explain her  symptoms are gallstones seen on ultrasound. Her liver function tests are  normal. Cholecystectomy was offered to her. She desired to have that done  and she is brought to the operating room electively.   OPERATIVE FINDINGS:  Gallbladder was chronically inflamed but thin-walled  and without any other abnormality. The anatomy of the cystic duct, cystic  artery and common bile duct were conventional. The cholangiogram was normal  showing small caliber intrahepatic and extrahepatic biliary ducts, normal  biliary anatomy otherwise, no filling defect, and no obstruction with good  flow of contrast into the duodenum. The liver, stomach, duodenum, small  intestine and large intestine were grossly normal to inspection. There was  some adhesions in the lower midline from her multiple previous surgeries   OPERATIVE TECHNIQUE:  Following induction of general endotracheal  anesthesia, the patient's abdomen was prepped  and draped in sterile fashion.  Marcaine 0.5% with epinephrine was used as a local infiltration anesthetic.  A curved transverse incision was made at the superior rim of the umbilicus.  The fascia was incised in the midline and the abdominal cavity entered under  direct vision. A 10 mm Hassan trocar was inserted and secured with a  pursestring suture of 0 Vicryl. Pneumoperitoneum was created. Video camera  was inserted with visualization and findings as described above. The 10 mm  trocar was placed in the subxiphoid region and two 5 mm trocars placed in  the right abdomen. The gallbladder fundus was identified and elevated. The  gallbladder infundibulum was identified and retracted laterally. We  dissected the peritoneum off of the cystic duct and cystic artery. We  isolated the cystic artery as it went onto the gallbladder wall anteriorly,  secured it with multiple metal clips and divided it. We also found a  posterior branch which we were able to easily isolate, secure with multiple  metal clips and divide as it went onto the wall of the gallbladder  posteriorly. The cystic duct was secured with a metal clip close to the  gallbladder. A cholangiogram catheter was inserted into the cystic duct. A  cholangiogram was obtained using the C-arm. Cholangiogram was normal  showing  no obstruction, no filling defect and normal anatomy. The cholangiogram  catheter was removed. The cystic duct was secured with multiple metal clips  and divided. The gallbladder dissected from its bed with electrocautery and  removed through the umbilical port. The operative field was irrigated.  Hemostasis was excellent and achieved with electrocautery.  At the  completion of the case, there was no bleeding and no bile leak whatsoever  and the irrigation fluid was completely clear. The trocars were removed  under direct vision. There was no bleeding from the trocar sites.  Pneumoperitoneum was released. The fascia at  the umbilicus closed with  0Vicryl sutures. The skin incisions were closed with subcuticular sutures of  4-0 Monocryl and Steri-Strips. Clean bandages were placed. The patient taken  to the recovery room in stable condition.   ESTIMATED BLOOD LOSS:  About 10 mL.   COMPLICATIONS:  None   Sponge, needle and instrument counts were correct.      Angelia Mould. Derrell Lolling, M.D.  Electronically Signed     Ashley Willis  D:  06/03/2005  T:  06/05/2005  Job:  [id number]   cc:   Vania Rea. Jarold Motto, M.D. [facility]  [address]  [place]  [place] [address]   Nicki Guadalajara, M.D.  Fax: [phone]   Rosalyn Gess. Norins, M.D. [facility]  [address]  [place]  [place] [address]   Lemmie Evens, M.D.  Fax: [phone]

## 2010-10-31 NOTE — Consult Note (Signed)
NAME:  [name], [name]              ACCOUNT NO.:  [id number]   MEDICAL RECORD NO.:  [id number]          PATIENT TYPE:  EMS   LOCATION:  MAJO                         FACILITY:  [facility]   PHYSICIAN:  Valetta Mole. Swords, M.D. Romualdo Bolk OF BIRTH:  1927/05/08   DATE OF CONSULTATION:  11/01/2004  DATE OF DISCHARGE:                                   CONSULTATION   REQUESTING PHYSICIAN:  Trudi Ida. Denton Lank, M.D.   CHIEF COMPLAINT:  Weak and cannot eat.   HISTORY OF THE PRESENT ILLNESS:  Ms. Ashley Willis is a 77 year old who is  generally healthy, but who complains of a 20-22 pound weight loss in the  past T wave months.  She came to the emergency room today for evaluation.  She states that her appetite has been decreased.  She states that after she  eats one to two bites she becomes full and really cannot swallow any more.  She states that she has a hiatal hernia.  She states that she has seen Dr.  Debby Bud about her weight loss, but she cannot tell me any  new diagnosis that  she has been given or the workup that has been pursued to date.  She denies  any abdominal pain.  She denies any GI blood loss.  She denies any nausea or  vomiting.  She really denies any heartburn.  She denies any other complaints  in the review of systems.   PAST MEDICAL HISTORY:  The past medical history is significant for:  1.  Myocardial infarction in 1989.  2.  The patient was hospitalized in 2005 with hematochezia, abdominal pain      with probable ischemic colitis.  3.  The patient had the same sort of presentation in 2003.  4.  The patient had a myocardial infarction in 1999.  5.  The patient has hypertension.  6.  Hyperlipidemia.  7.  The patient has had a bilateral tubal ligation.  8.  Hysterectomy.   CURRENT MEDICATIONS:  1.  Cardizem 180 mg p.o. daily.  2.  Zocor 80 mg p.o. daily.  3.  Aspirin 81 mg daily.  4.  Atenolol 50 mg 1.5 tablets p.o. daily.  5.  Altace 10 mg p.o. daily.  6.  Protonix 40 mg p.o. daily.  7.  Actonel 35 mg weekly.   SOCIAL HISTORY:  The patient is a nonsmoker.  No alcohol.  She lives with  her husband.  She is a Scientist, product/process development.   REVIEW OF SYSTEMS:  The patient denies any chest pain, shortness of breath,  PND, orthopnea, abdominal pain, rashes, or lower extremity edema, other any  other complaints in the review of systems.   PHYSICAL EXAMINATION:  VITAL SIGNS:  Temperature is 99.2, heart rate 75,  blood pressure 84/51 and respirations 18.  When I rechecked her blood  pressure (has received fluids in the emergency department) blood pressure is  110/54.  GENERAL APPEARANCE:  In general she appears as an elderly female who [place]  pale.  HEENT EXAMINATION:  Atraumatic and normocephalic.  Extraocular muscles are  intact. Conjunctivae are somewhat pale.  NECK:  The neck is without lymphadenopathy, thyromegaly, jugular venous  distention or carotid bruits.  CHEST:  The chest is clear to auscultation without any increased work of  breathing,  HEART:  On cardiac exam S1 and S2 are normal without murmurs or gallops.  ABDOMEN:  The abdominal exam shows active bowel sounds, soft and nontender.  There is no hepatosplenomegaly.  No masses are palpated.  EXTREMITIES:  There is no clubbing, cyanosis or edema.  NEUROLOGIC EXAMINATION:  The patient is alert and oriented without any motor  or sensory deficits.  RECTAL:  The rectal exam is without masses. Heme negative stool.   LABORATORY DATA:  White count 5.1, hemoglobin 8.4 and platelet count  314,000. Sodium 134 and calcium 8.3.  Albumin low at 2.9.  Urinalysis with 0-  2 white blood cells per high-powered field.  Cardiac markers are normal.   EKG demonstrates normal sinus rhythm; essentially normal EKG.   ASSESSMENT:  1.  This is a 77 year old female with weight loss; unclear how much, but by      her report 22 pounds,  2.  Anemia.  3.  Other medical problems as above.   PLAN:  I advised the patient that given her  presentation with weakness it  could be due or her anemia.  I think she ought to be transfused.  When  discussed she advised me that she is studying to be a [employer]hov[employer]'s [employer],  does not take any blood products and absolutely refuses any.  In light of  this I think the patient can go home.  She was advised to maintain her  hydration.  She was given IV fluids in the hospital and is feeling  better.  She needs further workup and she already has an appointment planned with Dr.  Illene Regulus in the next two days.  It could be that she needs further GI  evaluation given her history of ischemic colitis although her current  presentation is not consistent with that.  I have advised her to get some  over-the-counter iron and start taking that three times daily; although, I  do not think that is the complete cause of her anemia.       BHS/MEDQ  D:  11/01/2004  T:  11/02/2004  Job:  [id number]   cc:   Rosalyn Gess. Norins, M.D. [facility]   Nicki Guadalajara, M.D.  [address]  [place], [place] [address]  Fax: [phone]

## 2010-10-31 NOTE — Discharge Summary (Signed)
[place]. [facility]  Patient:    Ashley Willis, Ashley Willis Visit Number: [id number] MRN: [id number]          Service Type: MED Location: [phone] Attending Physician:  Tresa Garter Dictated by:   Cornell Barman, P.A. Admit Date:  12/05/2001 Discharge Date: 12/08/2001   CC:         Rosalyn Gess. Norins, M.D. [facility]  Lennette Bihari, M.D.  Vania Rea. Jarold Motto, M.D. [facility]   Discharge Summary  DISCHARGE DIAGNOSES: 1. Abdominal pain. 2. Hematochezia. 3. Ischemic colitis.  HISTORY OF PRESENT ILLNESS:  The patient is a 77 year old African-American female who presented with abdominal pain, vomiting and loose bloody stools.  PAST MEDICAL HISTORY: 1. Coronary artery disease, status post MI in 1989. 2. Status post partial abdominal hysterectomy. 3. Status post appendectomy.  HOSPITAL COURSE:  #1 - GASTROINTESTINAL:  As noted the patient presented with abdominal pain, nausea and hematochezia. The GI service was asked to see the patient. Dr. Juanda Chance saw the patient and her impression after reviewing the CT scan which was suspicious for cecal and ascending colitis, that she probably had an acute colitis of the right colon, consistent with an ischemic colitis, and she wondered if this was secondary to dehydration versus her multiple blood pressure medications causing a low flow state. She recommended bowel rest for 48 to 72 hours and then to slowly advance her diet. She did not feel that a colonoscopy was required at this time.  The patients condition slowly improved. Her diet was advanced from clears to a regular diet, which she did tolerate. She is still having loose stools but no further blood in the stool.  #2 - HYPOKALEMIA:  Likely secondary to colitis. This was replaced with oral potassium.  #3 - HYPERTENSION:  Was well controlled on her current regimen. No adjustments were made in her antihypertensives at this time.  DISCHARGE LABORATORY DATA:  Stool for C. difficile was negative. Potassium 4. Stool cultures were negative. Sed rate was 7. Coags were normal. Hemoglobin 12.6. TSH 2.8, 2.4. B12 955. Amylase and lipase are normal. Urinalysis was negative.  DISCHARGE MEDICATIONS: 1. Cardizem CD 180 mg q.d. 2. Zocor 80 mg q.d. 3. Atenolol 50 mg 1.5 tablets q.d. 4. Aspirin 81 mg q.d. 5. Vitamin E as at home.  FOLLOWUP: Follow up with Dr. Debby Bud in two to three weeks. Dictated by:   Cornell Barman, P.A. Attending Physician:  Tresa Garter DD:  12/08/01 TD:  12/09/01 Job: [id number]

## 2010-11-20 ENCOUNTER — Other Ambulatory Visit: Payer: Self-pay | Admitting: Neurosurgery

## 2010-11-20 DIAGNOSIS — M5126 Other intervertebral disc displacement, lumbar region: Secondary | ICD-10-CM

## 2010-11-27 ENCOUNTER — Ambulatory Visit
Admission: RE | Admit: 2010-11-27 | Discharge: 2010-11-27 | Disposition: A | Discharge status: Home / Self Care | Payer: Medicare Other | Source: Ambulatory Visit | Attending: Neurosurgery | Admitting: Neurosurgery

## 2010-11-27 DIAGNOSIS — M5126 Other intervertebral disc displacement, lumbar region: Secondary | ICD-10-CM

## 2010-12-01 ENCOUNTER — Ambulatory Visit (INDEPENDENT_AMBULATORY_CARE_PROVIDER_SITE_OTHER): Payer: Medicare Other | Admitting: Gastroenterology

## 2010-12-01 DIAGNOSIS — E538 Deficiency of other specified B group vitamins: Secondary | ICD-10-CM

## 2010-12-30 ENCOUNTER — Ambulatory Visit (INDEPENDENT_AMBULATORY_CARE_PROVIDER_SITE_OTHER): Payer: Medicare Other | Admitting: Gastroenterology

## 2010-12-30 ENCOUNTER — Telehealth: Payer: Self-pay | Admitting: *Deleted

## 2010-12-30 DIAGNOSIS — E538 Deficiency of other specified B group vitamins: Secondary | ICD-10-CM

## 2010-12-30 NOTE — Telephone Encounter (Signed)
appt made for 02/03/2011

## 2010-12-30 NOTE — Telephone Encounter (Signed)
Message copied by Leonette Monarch on Tue Dec 30, 2010 11:06 AM ------      Message from: Harlow Mares D      Created: Tue Dec 30, 2010 11:03 AM      Regarding: FW: B12 injections                   ----- Message -----         From: Orpah Melter. Earlene Plater         Sent: 12/30/2010  10:24 AM           To: Harlow Mares, CMA      Subject: B12 injections                                           Patient stated that when she started getting the Vitamin B12 injections that Dr. Jarold Motto told her she wouldn't have to take them on a long term basis and that it wasn't good. Patient question if she have to continue to take the injections?

## 2011-02-03 ENCOUNTER — Encounter: Payer: Self-pay | Admitting: Gastroenterology

## 2011-02-03 ENCOUNTER — Ambulatory Visit (INDEPENDENT_AMBULATORY_CARE_PROVIDER_SITE_OTHER): Payer: Medicare Other | Admitting: Gastroenterology

## 2011-02-03 ENCOUNTER — Other Ambulatory Visit (INDEPENDENT_AMBULATORY_CARE_PROVIDER_SITE_OTHER): Payer: Medicare Other

## 2011-02-03 VITALS — BP 122/64 | HR 60 | Ht 61.0 in | Wt 116.0 lb

## 2011-02-03 DIAGNOSIS — E538 Deficiency of other specified B group vitamins: Secondary | ICD-10-CM

## 2011-02-03 DIAGNOSIS — K5730 Diverticulosis of large intestine without perforation or abscess without bleeding: Secondary | ICD-10-CM

## 2011-02-03 DIAGNOSIS — D509 Iron deficiency anemia, unspecified: Secondary | ICD-10-CM

## 2011-02-03 DIAGNOSIS — R6889 Other general symptoms and signs: Secondary | ICD-10-CM

## 2011-02-03 LAB — BASIC METABOLIC PANEL
BUN: 17 mg/dL (ref 6–23)
CO2: 29 mEq/L (ref 19–32)
Calcium: 8.9 mg/dL (ref 8.4–10.5)
Chloride: 106 mEq/L (ref 96–112)
Creatinine, Ser: 0.7 mg/dL (ref 0.4–1.2)
GFR: 101.22 mL/min (ref >60.00)
Glucose, Bld: 91 mg/dL (ref 70–99)
Potassium: 4.1 mEq/L (ref 3.5–5.1)
Sodium: 141 mEq/L (ref 135–145)

## 2011-02-03 LAB — CBC WITH DIFFERENTIAL/PLATELET
Basophils Absolute: 0 10*3/uL (ref 0.0–0.1)
Basophils Relative: 0.2 % (ref 0.0–3.0)
Eosinophils Absolute: 0 10*3/uL (ref 0.0–0.7)
Eosinophils Relative: 0.1 % (ref 0.0–5.0)
HCT: 42 % (ref 36.0–46.0)
Hemoglobin: 13.6 g/dL (ref 12.0–15.0)
Lymphocytes Relative: 24.9 % (ref 12.0–46.0)
Lymphs Abs: 2.4 10*3/uL (ref 0.7–4.0)
MCHC: 32.3 g/dL (ref 30.0–36.0)
MCV: 89 fl (ref 78.0–100.0)
Monocytes Absolute: 0.6 10*3/uL (ref 0.1–1.0)
Monocytes Relative: 6.5 % (ref 3.0–12.0)
Neutro Abs: 6.5 10*3/uL (ref 1.4–7.7)
Neutrophils Relative %: 68.3 % (ref 43.0–77.0)
Platelets: 171 10*3/uL (ref 150.0–400.0)
RBC: 4.72 Mil/uL (ref 3.87–5.11)
RDW: 14.1 % (ref 11.5–14.6)
WBC: 9.4 10*3/uL (ref 4.5–10.5)

## 2011-02-03 LAB — HEPATIC FUNCTION PANEL
ALT: 32 U/L (ref 0–35)
AST: 33 U/L (ref 0–37)
Albumin: 3.9 g/dL (ref 3.5–5.2)
Alkaline Phosphatase: 78 U/L (ref 39–117)
Bilirubin, Direct: 0.1 mg/dL (ref 0.0–0.3)
Total Bilirubin: 0.7 mg/dL (ref 0.3–1.2)
Total Protein: 8 g/dL (ref 6.0–8.3)

## 2011-02-03 LAB — TSH: TSH: 1.56 u[IU]/mL (ref 0.35–5.50)

## 2011-02-03 LAB — IBC PANEL
Iron: 106 ug/dL (ref 42–145)
Saturation Ratios: 32.9 % (ref 20.0–50.0)
Transferrin: 230.4 mg/dL (ref 212.0–360.0)

## 2011-02-03 LAB — FOLATE: Folate: 10.8 ng/mL (ref >5.9)

## 2011-02-03 LAB — FERRITIN: Ferritin: 548 ng/mL — ABNORMAL HIGH (ref 10.0–291.0)

## 2011-02-03 LAB — VITAMIN B12: Vitamin B-12: 756 pg/mL (ref 211–911)

## 2011-02-03 NOTE — Patient Instructions (Signed)
Please go to the basement today for your labs.   

## 2011-02-03 NOTE — Progress Notes (Signed)
This is a 77 year old African American female with a history of recurrent colon polyps and complicated diverticulosis with associated sigmoid colon stricture. She currently is asymptomatic on a high fiber diet. She previously was treated for acid reflux but currently is not using the acid suppressors. Her appetite is good and her weight is stable, she has regular BMs without melena or hematochezia, and denies abdominal pain, or any systemic or hepatobiliary complaints. She has a history of B12 deficiency and is on parenteral replacement therapy. She also takes aspirin 325 mg a day and ferrous sulfate 325 mg a day. She has mild essential hypertension and history of gouty arthritis.  Current Medications, Allergies, Past Medical History, Past Surgical History, Family History and Social History were reviewed in [employer] record.  Pertinent Review of Systems Negative   Physical Exam: Healthy appearing female appearing younger than her stated age. I cannot appreciate stigmata of chronic liver disease. Chest is clear cardiac exam shows a regular rhythm without significant murmurs gallops or rubs. There is no organomegaly, abdominal masses or tenderness. Bowel sounds are normal, and peripheral extremities are unremarkable. Mental status is normal.    Assessment and Plan: Chronic diverticulosis well managed on a high-fiber diet. Her last colonoscopy was 3 years ago with multiple colon polyps excised. I have asked her to repeat stool  IFOB origin also will check CBC, anemia profile, and metabolic profile. Continue primary care followup with Dr. Debby Bud as scheduled. Her blood pressure today was normal at 122/64.-Continue her antihypertensive medications as listed and reviewed. Encounter Diagnoses  Name Primary?  . Vitamin B12 deficiency Yes  . Other general symptoms    . Iron deficiency anemia, unspecified     Please copy her primary care physician, referring physician, and pertinent  subspecialists.

## 2011-02-04 ENCOUNTER — Telehealth: Payer: Self-pay | Admitting: *Deleted

## 2011-02-04 NOTE — Telephone Encounter (Signed)
Informed pt we need to hold B12 shots d/t her level is normal. Pt has no more appts ; pt stated understanding.

## 2011-02-04 NOTE — Telephone Encounter (Signed)
Message copied by Florene Glen on Wed Feb 04, 2011 10:37 AM ------      Message from: Jarold Motto, DAVID R      Created: Wed Feb 04, 2011 10:30 AM       HOLD B12 SHOTS.Marland KitchenMarland Kitchen

## 2011-02-05 ENCOUNTER — Other Ambulatory Visit: Payer: Medicare Other

## 2011-02-05 ENCOUNTER — Other Ambulatory Visit: Payer: Self-pay | Admitting: Gastroenterology

## 2011-02-05 DIAGNOSIS — Z1211 Encounter for screening for malignant neoplasm of colon: Secondary | ICD-10-CM

## 2011-02-05 LAB — FECAL OCCULT BLOOD, IMMUNOCHEMICAL: Fecal Occult Bld: NEGATIVE

## 2011-03-04 ENCOUNTER — Telehealth: Payer: Self-pay | Admitting: Gastroenterology

## 2011-03-04 MED ORDER — HYDROCORTISONE ACETATE 25 MG RE SUPP: RECTAL | Status: DC

## 2011-03-04 NOTE — Telephone Encounter (Signed)
ANNUSOL SUPP QHS

## 2011-03-04 NOTE — Telephone Encounter (Signed)
Pt with hx of Diverticulosis, Iron Deficiency Anemia, B12 Deficiency- B12 inj on hold, last COLON 02/28/10 with tubular adenomas.  Last OV 02/03/11 labs were normal- B12 inj put on hold, IFOB negative. Hx of rectal bleeding in the past which led to her COLON last fall. Pt reports BRB in the toilet x 2 days. She states she feels as though she needs to have a BM, but's it's blood in the toilet instead. She denies constipation and pain. She called Dr Debby Bud' office yesterday and has an appt on 03/12/11. You have openings tomorrow am; schedule OV? Thanks.

## 2011-03-04 NOTE — Telephone Encounter (Signed)
Notified pt we will order suppositories for her bleeding. Pt prefers [facility] [address]

## 2011-03-12 ENCOUNTER — Ambulatory Visit (INDEPENDENT_AMBULATORY_CARE_PROVIDER_SITE_OTHER): Payer: Medicare Other | Admitting: Internal Medicine

## 2011-03-12 VITALS — BP 110/60 | HR 66 | Temp 98.2°F | Wt 121.0 lb

## 2011-03-12 DIAGNOSIS — K921 Melena: Secondary | ICD-10-CM

## 2011-03-15 NOTE — Progress Notes (Signed)
  Subjective:    Patient ID: Ashley Willis, female    DOB: 02-Oct-1926, 77 y.o.   MRN: [id number]  HPI Ashley Willis presents with a c/o episode of hematochezia. She reports that on 9/19 she had a sensation of tenesmus and when she sat on the commode she passed a small volume of BRB. She had no abdominal pain, no fever, no cramping. She has not had a recurrence and has had normal BMs since. She as no h/o constipation, diarrhea, N/V or pain. She called Dr. Norval Gable office - [facility] suppositories were called in for her but she did not know how to use them. Last colonoscopy Sept '11 revealed a tubular adenoma that was excised, severe diverticulosis. She has not had diverticulitis or a previous diverticular bleed. She has a long h/o iron deficiency anemia and B12 deficiency. Last Hgb = 13.6 August '12. She does take ASA 325 mg qd.   I have reviewed the patient's medical history in detail and updated the computerized patient record.    Review of Systems System review is negative for any constitutional, cardiac, pulmonary, GI or neuro symptoms or complaints other than as described in the HPI.      Objective:   Physical Exam Vitals reviewed - stble BP, no fever Gen'l - WNWD older AA female in no distress HEENT - C&S clear  Pulm - normal respirations Cor - 2+ radial pulse, RRR Abdomen -  BS+ x 4,  Mild tenderness in epigastric region. Rectal - internal hemorrhoid at the 12 0'clock position. No external hemorrhoids. Stool heme negative.        Assessment & Plan:  Hematochezia - most likely hemorrhoidal bleed now resolved.   Plan - reassurance           ROV for any further occurrence.

## 2011-04-23 ENCOUNTER — Other Ambulatory Visit: Payer: Self-pay | Admitting: Internal Medicine

## 2011-05-02 ENCOUNTER — Telehealth: Payer: Self-pay | Admitting: Oncology

## 2011-05-02 NOTE — Telephone Encounter (Signed)
Called pt to give appt 07/02/11 @ 11.15am. Phn rand with no ans/ no vm. Mailed apt an appt calendar.

## 2011-05-20 ENCOUNTER — Other Ambulatory Visit: Payer: Self-pay | Admitting: Internal Medicine

## 2011-05-20 DIAGNOSIS — Z1231 Encounter for screening mammogram for malignant neoplasm of breast: Secondary | ICD-10-CM

## 2011-06-25 ENCOUNTER — Ambulatory Visit
Admission: RE | Admit: 2011-06-25 | Discharge: 2011-06-25 | Disposition: A | Discharge status: Home / Self Care | Payer: Medicare Other | Source: Ambulatory Visit | Attending: Internal Medicine | Admitting: Internal Medicine

## 2011-06-25 DIAGNOSIS — Z1231 Encounter for screening mammogram for malignant neoplasm of breast: Secondary | ICD-10-CM

## 2011-07-01 ENCOUNTER — Other Ambulatory Visit: Payer: Self-pay | Admitting: *Deleted

## 2011-07-01 DIAGNOSIS — D509 Iron deficiency anemia, unspecified: Secondary | ICD-10-CM

## 2011-07-02 ENCOUNTER — Other Ambulatory Visit (HOSPITAL_BASED_OUTPATIENT_CLINIC_OR_DEPARTMENT_OTHER): Payer: Medicare Other

## 2011-07-02 ENCOUNTER — Telehealth: Payer: Self-pay | Admitting: Oncology

## 2011-07-02 ENCOUNTER — Ambulatory Visit (HOSPITAL_BASED_OUTPATIENT_CLINIC_OR_DEPARTMENT_OTHER): Payer: Medicare Other | Admitting: Nurse Practitioner

## 2011-07-02 VITALS — BP 147/64 | HR 71 | Temp 97.5°F | Ht 61.0 in | Wt 125.1 lb

## 2011-07-02 DIAGNOSIS — D509 Iron deficiency anemia, unspecified: Secondary | ICD-10-CM

## 2011-07-02 LAB — CBC WITH DIFFERENTIAL/PLATELET
BASO%: 0.7 % (ref 0.0–2.0)
Basophils Absolute: 0.1 10*3/uL (ref 0.0–0.1)
EOS%: 0.4 % (ref 0.0–7.0)
Eosinophils Absolute: 0 10*3/uL (ref 0.0–0.5)
HCT: 42.7 % (ref 34.8–46.6)
HGB: 13.8 g/dL (ref 11.6–15.9)
LYMPH%: 26.1 % (ref 14.0–49.7)
MCH: 29.2 pg (ref 25.1–34.0)
MCHC: 32.2 g/dL (ref 31.5–36.0)
MCV: 90.7 fL (ref 79.5–101.0)
MONO#: 0.5 10*3/uL (ref 0.1–0.9)
MONO%: 6.6 % (ref 0.0–14.0)
NEUT#: 5.4 10*3/uL (ref 1.5–6.5)
NEUT%: 66.2 % (ref 38.4–76.8)
Platelets: 179 10*3/uL (ref 145–400)
RBC: 4.71 10*6/uL (ref 3.70–5.45)
RDW: 13.9 % (ref 11.2–14.5)
WBC: 8.2 10*3/uL (ref 3.9–10.3)
lymph#: 2.1 10*3/uL (ref 0.9–3.3)

## 2011-07-02 NOTE — Progress Notes (Signed)
OFFICE PROGRESS NOTE  Interval history:  Ashley Willis is an 78 year old woman with a history of iron deficiency anemia. She is seen today for scheduled followup.  Ashley Willis reports that she feels well. No recent illnesses or infections. She has a good appetite and overall good energy level. No bleeding. Bowels moving regularly. No shortness of breath. She is taking 1 iron tablet daily.   Objective: Blood pressure 147/64, pulse 71, temperature 97.5 F (36.4 C), temperature source Oral, height 5\' 1"  (1.549 m), weight 125 lb 1.6 oz (56.745 kg).  Oropharynx is without thrush or ulceration. No palpable cervical supraclavicular or left axillary lymph nodes. Question soft mobile 1 cm right axillary node. Lungs clear. No wheezes or rales. Regular cardiac rhythm. Abdomen soft and nontender. No organomegaly. Extremities are without edema.  Lab Results: Lab Results  Component Value Date   WBC 8.2 07/02/2011   HGB 13.8 07/02/2011   HCT 42.7 07/02/2011   MCV 90.7 07/02/2011   PLT 179 07/02/2011    Chemistry:    Chemistry      Component Value Date/Time   NA 141 02/03/2011 1108   K 4.1 02/03/2011 1108   CL 106 02/03/2011 1108   CO2 29 02/03/2011 1108   BUN 17 02/03/2011 1108   CREATININE 0.7 02/03/2011 1108      Component Value Date/Time   CALCIUM 8.9 02/03/2011 1108   ALKPHOS 78 02/03/2011 1108   AST 33 02/03/2011 1108   ALT 32 02/03/2011 1108   BILITOT 0.7 02/03/2011 1108       Studies/Results: Mm Digital Screening  06/26/2011  DG SCREEN MAMMOGRAM BILATERAL Bilateral CC and MLO view(s) were taken.  DIGITAL SCREENING MAMMOGRAM WITH CAD: There are scattered fibroglandular densities.  No masses or malignant type calcifications are  identified.  Compared with prior studies.  Images were processed with CAD.  IMPRESSION: No specific mammographic evidence of malignancy.  Next screening mammogram is recommended in one  year.  A result letter of this screening mammogram will be mailed directly to the  patient.  ASSESSMENT: Benign - BI-RADS 2  Screening mammogram in 1 year. ,    Medications: I have reviewed the patient's current medications.  Assessment/Plan:  1. History of iron deficiency anemia. Hemoglobin remains in normal range. She is taking 1 iron tablet daily. 2. History of anorexia/weight loss, improved.  She reports a good appetite at today's visit. Her weight is stable. 3. History of early satiety, status post an EGD evaluation by Dr. Jarold Motto.   4. History of bilateral axillary adenopathy, status post a biopsy 04/21/2005 with benign pathology. 5. Status post cholecystectomy. 6. Large broad based foraminal extraforaminal disc protrusion on the right side at L4-L5, creating significant foraminal stenosis on an MRI 05/07/2008, status post several injections with improvement in pain. 7. Negative mammogram 06/26/2011. 8. History of an MI. 9. Hypertension. 10. Hyperlipidemia.  Disposition-Ashley Willis appears stable. Hemoglobin remains in normal range. She will return for a followup visit in one year. She will contact the office in the interim with any problems.  Plan reviewed with Dr. Truett Perna.  Lonna Cobb ANP/GNP-BC

## 2011-07-02 NOTE — Telephone Encounter (Signed)
Not able to reach pt by phone re appt for jan 2014. Schedule mailed.

## 2011-07-15 DIAGNOSIS — M5126 Other intervertebral disc displacement, lumbar region: Secondary | ICD-10-CM | POA: Diagnosis not present

## 2011-07-22 ENCOUNTER — Other Ambulatory Visit: Payer: Self-pay | Admitting: Internal Medicine

## 2011-07-30 ENCOUNTER — Ambulatory Visit (INDEPENDENT_AMBULATORY_CARE_PROVIDER_SITE_OTHER): Payer: Medicare Other | Admitting: Internal Medicine

## 2011-07-30 ENCOUNTER — Other Ambulatory Visit (INDEPENDENT_AMBULATORY_CARE_PROVIDER_SITE_OTHER): Payer: Medicare Other

## 2011-07-30 ENCOUNTER — Encounter: Payer: Self-pay | Admitting: Internal Medicine

## 2011-07-30 VITALS — BP 132/74 | HR 80 | Temp 98.0°F | Resp 14 | Wt 127.5 lb

## 2011-07-30 DIAGNOSIS — I10 Essential (primary) hypertension: Secondary | ICD-10-CM | POA: Diagnosis not present

## 2011-07-30 DIAGNOSIS — E538 Deficiency of other specified B group vitamins: Secondary | ICD-10-CM | POA: Diagnosis not present

## 2011-07-30 DIAGNOSIS — I2510 Atherosclerotic heart disease of native coronary artery without angina pectoris: Secondary | ICD-10-CM

## 2011-07-30 DIAGNOSIS — D509 Iron deficiency anemia, unspecified: Secondary | ICD-10-CM

## 2011-07-30 DIAGNOSIS — M069 Rheumatoid arthritis, unspecified: Secondary | ICD-10-CM

## 2011-07-30 DIAGNOSIS — Z0000 Encounter for general adult medical examination without abnormal findings: Secondary | ICD-10-CM | POA: Diagnosis not present

## 2011-07-30 DIAGNOSIS — E785 Hyperlipidemia, unspecified: Secondary | ICD-10-CM

## 2011-07-30 LAB — CBC WITH DIFFERENTIAL/PLATELET
Basophils Absolute: 0 10*3/uL (ref 0.0–0.1)
Basophils Relative: 0.6 % (ref 0.0–3.0)
Eosinophils Absolute: 0.1 10*3/uL (ref 0.0–0.7)
Eosinophils Relative: 1.9 % (ref 0.0–5.0)
HCT: 42.2 % (ref 36.0–46.0)
Hemoglobin: 13.6 g/dL (ref 12.0–15.0)
Lymphocytes Relative: 31 % (ref 12.0–46.0)
Lymphs Abs: 2.4 10*3/uL (ref 0.7–4.0)
MCHC: 32.3 g/dL (ref 30.0–36.0)
MCV: 90.2 fl (ref 78.0–100.0)
Monocytes Absolute: 0.6 10*3/uL (ref 0.1–1.0)
Monocytes Relative: 8.4 % (ref 3.0–12.0)
Neutro Abs: 4.4 10*3/uL (ref 1.4–7.7)
Neutrophils Relative %: 58.1 % (ref 43.0–77.0)
Platelets: 163 10*3/uL (ref 150.0–400.0)
RBC: 4.67 Mil/uL (ref 3.87–5.11)
RDW: 13.9 % (ref 11.5–14.6)
WBC: 7.6 10*3/uL (ref 4.5–10.5)

## 2011-07-30 LAB — COMPREHENSIVE METABOLIC PANEL
ALT: 21 U/L (ref 0–35)
AST: 27 U/L (ref 0–37)
Albumin: 3.5 g/dL (ref 3.5–5.2)
Alkaline Phosphatase: 59 U/L (ref 39–117)
BUN: 10 mg/dL (ref 6–23)
CO2: 30 mEq/L (ref 19–32)
Calcium: 9 mg/dL (ref 8.4–10.5)
Chloride: 105 mEq/L (ref 96–112)
Creatinine, Ser: 0.8 mg/dL (ref 0.4–1.2)
GFR: 90.71 mL/min (ref >60.00)
Glucose, Bld: 88 mg/dL (ref 70–99)
Potassium: 3.7 mEq/L (ref 3.5–5.1)
Sodium: 142 mEq/L (ref 135–145)
Total Bilirubin: 0.9 mg/dL (ref 0.3–1.2)
Total Protein: 7.3 g/dL (ref 6.0–8.3)

## 2011-07-30 LAB — LIPID PANEL
Cholesterol: 143 mg/dL (ref 0–200)
HDL: 54.8 mg/dL (ref >39.00)
LDL Cholesterol: 69 mg/dL (ref 0–99)
Total CHOL/HDL Ratio: 3
Triglycerides: 95 mg/dL (ref 0.0–149.0)
VLDL: 19 mg/dL (ref 0.0–40.0)

## 2011-07-30 LAB — HEPATIC FUNCTION PANEL
ALT: 21 U/L (ref 0–35)
AST: 27 U/L (ref 0–37)
Albumin: 3.5 g/dL (ref 3.5–5.2)
Alkaline Phosphatase: 59 U/L (ref 39–117)
Bilirubin, Direct: 0.1 mg/dL (ref 0.0–0.3)
Total Bilirubin: 0.9 mg/dL (ref 0.3–1.2)
Total Protein: 7.3 g/dL (ref 6.0–8.3)

## 2011-07-30 LAB — VITAMIN B12: Vitamin B-12: 620 pg/mL (ref 211–911)

## 2011-07-30 MED ORDER — ATORVASTATIN CALCIUM 40 MG PO TABS: 40.0000 mg | ORAL_TABLET | Freq: Every day | ORAL | Status: DC

## 2011-07-30 MED ORDER — PANTOPRAZOLE SODIUM 40 MG PO TBEC: 40.0000 mg | DELAYED_RELEASE_TABLET | Freq: Every day | ORAL | Status: DC

## 2011-07-30 MED ORDER — DILTIAZEM HCL ER COATED BEADS 180 MG PO CP24: 180.0000 mg | ORAL_CAPSULE | Freq: Every day | ORAL | Status: DC

## 2011-07-30 MED ORDER — VALSARTAN 80 MG PO TABS: 80.0000 mg | ORAL_TABLET | Freq: Every day | ORAL | Status: DC

## 2011-07-30 NOTE — Progress Notes (Signed)
Subjective:    Patient ID: Ashley Willis, female    DOB: 1927-01-14, 78 y.o.   MRN: [id number]  HPI The patient is here for annual Medicare wellness examination and management of other chronic and acute problems. She has been doing well. She saw Dr. Dierdre Forth for rheumatology in August '12; she saw Dr. Tresa Endo for cardiology in March '12. She is stable in regards to these problems.   The risk factors are reflected in the social history.  The roster of all physicians providing medical care to patient - is listed in the Snapshot section of the chart.  Activities of daily living:  The patient is 100% inedpendent in all ADLs: dressing, toileting, feeding as well as independent mobility  Home safety : The patient has smoke detectors in the home. Fall- house is fall safe. No grabs.  They wear seatbelts. firearms are present in the home, kept in a safe fashion. There is no violence in the home.   There is no risks for hepatitis, STDs or HIV. There is no   history of blood transfusion. They have no travel history to infectious disease endemic areas of the world.  The patient has not seen their dentist in the last six month has full dentures. They have seen their eye doctor in the last year. They deny any hearing difficulty and have not had audiologic testing in the last year.  They do not  have excessive sun exposure. Discussed the need for sun protection: hats, long sleeves and use of sunscreen if there is significant sun exposure.   Diet: the importance of a healthy diet is discussed. They do have a healthy diet.  The patient has no regular exercise program.  The benefits of regular aerobic exercise were discussed.  Depression screen: there are no signs or vegative symptoms of depression- irritability, change in appetite, anhedonia, sadness/tearfullness.  Cognitive assessment: the patient manages all their financial and personal affairs and is actively engaged.   The following portions of the  patient's history were reviewed and updated as appropriate: allergies, current medications, past family history, past medical history,  past surgical history, past social history  and problem list.  Vision, hearing, body mass index were assessed and reviewed.   During the course of the visit the patient was educated and counseled about appropriate screening and preventive services including : fall prevention , diabetes screening, nutrition counseling, colorectal cancer screening, and recommended immunizations.  Past Medical History  Diagnosis Date  . Personal history of colonic polyps 03/03/2010    tubular adenomas  . Vitamin B12 deficiency   . Esophageal reflux   . Anemia   . CAD (coronary artery disease)   . Hyperlipemia   . Hypertension   . Pancreatitis   . Rheumatoid arthritis   . Osteoporosis, unspecified   . Memory loss   . Calculus of gallbladder without mention of cholecystitis or obstruction   . Diverticulosis of colon (without mention of hemorrhage)   . Stricture and stenosis of esophagus   . Hiatal hernia   . Other symptoms involving abdomen and pelvis   . Insomnia, unspecified    Past Surgical History  Procedure Date  . Appendectomy   . Angioplasty   . Cholecystectomy   . Axillary surgery    Family History  Problem Relation Age of Onset  . Diabetes Mother   . Colon cancer Neg Hx    History   Social History  . Marital Status: Married    Spouse Name: N/A  Number of Children: 4  . Years of Education: N/A   Occupational History  . Retired    Social History Main Topics  . Smoking status: Never Smoker   . Smokeless tobacco: Never Used  . Alcohol Use: No  . Drug Use: No  . Sexually Active: Not Currently   Other Topics Concern  . Not on file   Social History Narrative   married 1948. 1 daughter 76; 3 sons 29, 72, 41. 9 grandchildrens; 6 great-grandchildren. marriage in good health. 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  HEENT:  Negative for hearing loss, ear pain, congestion, neck stiffness and postnasal drip. Negative for sore throat or swallowing problems. Negative for dental complaints.   Eyes: Negative for vision loss or change in visual acuity.  Respiratory: Negative for chest tightness and wheezing. Negative for DOE.   Cardiovascular: Negative for chest pain or palpitations. No decreased exercise tolerance Gastrointestinal: No change in bowel habit. No bloating or gas. No reflux or indigestion Genitourinary: Negative for urgency, frequency, flank pain and difficulty urinating.  Musculoskeletal: Negative for myalgias, back pain, arthralgias and gait problem.  Neurological: Negative for dizziness, tremors, weakness and headaches.  Hematological: Negative for adenopathy.  Psychiatric/Behavioral: Negative for behavioral problems and dysphoric mood.       Objective:   Physical Exam Filed Vitals:   07/30/11 0937  BP: 132/74  Pulse: 80  Temp: 98 F (36.7 C)  Resp: 14  Weight: 127 lb 8 oz (57.834 kg)  There is no height on file to calculate BMI.  Gen'l - very nice AA woman who looks younger that her stated age HEENT- C&S clear Pulm - normal respirations Cor - RRR Neuro - Q&O x 3, CN grossly normal, normal gait.  Lab Results  Component Value Date   WBC 7.6 07/30/2011   HGB 13.6 07/30/2011   HCT 42.2 07/30/2011   PLT 163.0 07/30/2011   GLUCOSE 88 07/30/2011   CHOL 143 07/30/2011   TRIG 95.0 07/30/2011   HDL 54.80 07/30/2011   LDLDIRECT 131.1 10/16/2008   LDLCALC 69 07/30/2011        ALT 21 07/30/2011   AST 27 07/30/2011        NA 142 07/30/2011   K 3.7 07/30/2011   CL 105 07/30/2011   CREATININE 0.8 07/30/2011   BUN 10 07/30/2011   CO2 30 07/30/2011   TSH 1.56 02/03/2011       B12                        620                                                                  07/30/2011        Assessment & Plan:

## 2011-07-30 NOTE — Patient Instructions (Signed)
You look GREAT. Your exam is normal. Your medications have been refilled except for the atenolol - please check to see if this is in you medicine cabinet. You will have routine lab work today. I will send you a copy of today's note and also I will send a copy to Drs. Dierdre Forth and [employer].  Please call if I can be of any help to you.

## 2011-08-01 ENCOUNTER — Encounter: Payer: Self-pay | Admitting: Internal Medicine

## 2011-08-01 DIAGNOSIS — Z0000 Encounter for general adult medical examination without abnormal findings: Secondary | ICD-10-CM | POA: Insufficient documentation

## 2011-08-01 NOTE — Assessment & Plan Note (Signed)
Lab Results  Component Value Date   VITAMINB12 620 07/30/2011   Patient is doing well with replacement with normal range B12.

## 2011-08-01 NOTE — Assessment & Plan Note (Signed)
Lab Results  Component Value Date   HGB 13.6 07/30/2011   No active problem

## 2011-08-01 NOTE — Assessment & Plan Note (Signed)
Followed Closely at [facility] and doing well.

## 2011-08-01 NOTE — Assessment & Plan Note (Addendum)
Currently off medications and doing well. She denies taking methotrexate at this time.  No recommendations at this time.

## 2011-08-01 NOTE — Assessment & Plan Note (Signed)
Interval history is unremarkable. Limited physical exam - ok. She is current for colon and breast cancer screening. Lab results are fine.  In summary - a very nice woman who is medically stable. She is encouraged to walk daily and to continue all her medications. She will return in 1 year, sooner as needed.

## 2011-08-01 NOTE — Assessment & Plan Note (Signed)
LDL cholesterol better than goal of 80 or less.  Plan- continue present medications

## 2011-08-01 NOTE — Assessment & Plan Note (Signed)
BP Readings from Last 3 Encounters:  07/30/11 132/74  07/02/11 147/64  03/12/11 110/60   Good control on present regimen. No changes proposed.

## 2011-09-21 DIAGNOSIS — I2510 Atherosclerotic heart disease of native coronary artery without angina pectoris: Secondary | ICD-10-CM | POA: Diagnosis not present

## 2011-09-21 DIAGNOSIS — E782 Mixed hyperlipidemia: Secondary | ICD-10-CM | POA: Diagnosis not present

## 2011-09-22 DIAGNOSIS — H6120 Impacted cerumen, unspecified ear: Secondary | ICD-10-CM | POA: Diagnosis not present

## 2011-09-23 DIAGNOSIS — M5126 Other intervertebral disc displacement, lumbar region: Secondary | ICD-10-CM | POA: Diagnosis not present

## 2011-10-12 DIAGNOSIS — M810 Age-related osteoporosis without current pathological fracture: Secondary | ICD-10-CM | POA: Diagnosis not present

## 2011-10-12 DIAGNOSIS — M159 Polyosteoarthritis, unspecified: Secondary | ICD-10-CM | POA: Diagnosis not present

## 2011-10-12 DIAGNOSIS — M67919 Unspecified disorder of synovium and tendon, unspecified shoulder: Secondary | ICD-10-CM | POA: Diagnosis not present

## 2011-10-28 DIAGNOSIS — M5126 Other intervertebral disc displacement, lumbar region: Secondary | ICD-10-CM | POA: Diagnosis not present

## 2011-10-29 ENCOUNTER — Other Ambulatory Visit: Payer: Self-pay | Admitting: Internal Medicine

## 2011-11-02 DIAGNOSIS — IMO0002 Reserved for concepts with insufficient information to code with codable children: Secondary | ICD-10-CM | POA: Diagnosis not present

## 2011-11-02 DIAGNOSIS — M545 Low back pain, unspecified: Secondary | ICD-10-CM | POA: Diagnosis not present

## 2011-11-05 DIAGNOSIS — M545 Low back pain, unspecified: Secondary | ICD-10-CM | POA: Diagnosis not present

## 2011-11-05 DIAGNOSIS — IMO0002 Reserved for concepts with insufficient information to code with codable children: Secondary | ICD-10-CM | POA: Diagnosis not present

## 2011-11-13 DIAGNOSIS — M545 Low back pain, unspecified: Secondary | ICD-10-CM | POA: Diagnosis not present

## 2011-11-13 DIAGNOSIS — IMO0002 Reserved for concepts with insufficient information to code with codable children: Secondary | ICD-10-CM | POA: Diagnosis not present

## 2011-11-18 DIAGNOSIS — M810 Age-related osteoporosis without current pathological fracture: Secondary | ICD-10-CM | POA: Diagnosis not present

## 2011-11-18 DIAGNOSIS — M159 Polyosteoarthritis, unspecified: Secondary | ICD-10-CM | POA: Diagnosis not present

## 2011-11-18 DIAGNOSIS — M719 Bursopathy, unspecified: Secondary | ICD-10-CM | POA: Diagnosis not present

## 2011-11-18 DIAGNOSIS — M67919 Unspecified disorder of synovium and tendon, unspecified shoulder: Secondary | ICD-10-CM | POA: Diagnosis not present

## 2011-11-20 DIAGNOSIS — IMO0002 Reserved for concepts with insufficient information to code with codable children: Secondary | ICD-10-CM | POA: Diagnosis not present

## 2011-11-20 DIAGNOSIS — M545 Low back pain, unspecified: Secondary | ICD-10-CM | POA: Diagnosis not present

## 2011-11-25 DIAGNOSIS — M545 Low back pain, unspecified: Secondary | ICD-10-CM | POA: Diagnosis not present

## 2011-11-25 DIAGNOSIS — IMO0002 Reserved for concepts with insufficient information to code with codable children: Secondary | ICD-10-CM | POA: Diagnosis not present

## 2011-11-27 DIAGNOSIS — IMO0002 Reserved for concepts with insufficient information to code with codable children: Secondary | ICD-10-CM | POA: Diagnosis not present

## 2011-11-27 DIAGNOSIS — M545 Low back pain, unspecified: Secondary | ICD-10-CM | POA: Diagnosis not present

## 2011-12-02 DIAGNOSIS — IMO0002 Reserved for concepts with insufficient information to code with codable children: Secondary | ICD-10-CM | POA: Diagnosis not present

## 2011-12-02 DIAGNOSIS — M545 Low back pain, unspecified: Secondary | ICD-10-CM | POA: Diagnosis not present

## 2011-12-03 DIAGNOSIS — M545 Low back pain, unspecified: Secondary | ICD-10-CM | POA: Diagnosis not present

## 2011-12-03 DIAGNOSIS — IMO0002 Reserved for concepts with insufficient information to code with codable children: Secondary | ICD-10-CM | POA: Diagnosis not present

## 2011-12-04 DIAGNOSIS — H43399 Other vitreous opacities, unspecified eye: Secondary | ICD-10-CM | POA: Diagnosis not present

## 2011-12-04 DIAGNOSIS — H01009 Unspecified blepharitis unspecified eye, unspecified eyelid: Secondary | ICD-10-CM | POA: Diagnosis not present

## 2011-12-04 DIAGNOSIS — H04129 Dry eye syndrome of unspecified lacrimal gland: Secondary | ICD-10-CM | POA: Diagnosis not present

## 2011-12-04 DIAGNOSIS — H02839 Dermatochalasis of unspecified eye, unspecified eyelid: Secondary | ICD-10-CM | POA: Diagnosis not present

## 2011-12-04 DIAGNOSIS — Z961 Presence of intraocular lens: Secondary | ICD-10-CM | POA: Diagnosis not present

## 2011-12-07 DIAGNOSIS — M545 Low back pain, unspecified: Secondary | ICD-10-CM | POA: Diagnosis not present

## 2011-12-07 DIAGNOSIS — IMO0002 Reserved for concepts with insufficient information to code with codable children: Secondary | ICD-10-CM | POA: Diagnosis not present

## 2011-12-08 DIAGNOSIS — M545 Low back pain, unspecified: Secondary | ICD-10-CM | POA: Diagnosis not present

## 2011-12-08 DIAGNOSIS — IMO0002 Reserved for concepts with insufficient information to code with codable children: Secondary | ICD-10-CM | POA: Diagnosis not present

## 2011-12-14 DIAGNOSIS — IMO0002 Reserved for concepts with insufficient information to code with codable children: Secondary | ICD-10-CM | POA: Diagnosis not present

## 2011-12-14 DIAGNOSIS — M545 Low back pain, unspecified: Secondary | ICD-10-CM | POA: Diagnosis not present

## 2011-12-15 DIAGNOSIS — M545 Low back pain, unspecified: Secondary | ICD-10-CM | POA: Diagnosis not present

## 2011-12-15 DIAGNOSIS — IMO0002 Reserved for concepts with insufficient information to code with codable children: Secondary | ICD-10-CM | POA: Diagnosis not present

## 2011-12-21 DIAGNOSIS — M719 Bursopathy, unspecified: Secondary | ICD-10-CM | POA: Diagnosis not present

## 2011-12-21 DIAGNOSIS — M67919 Unspecified disorder of synovium and tendon, unspecified shoulder: Secondary | ICD-10-CM | POA: Diagnosis not present

## 2011-12-21 DIAGNOSIS — M25519 Pain in unspecified shoulder: Secondary | ICD-10-CM | POA: Diagnosis not present

## 2011-12-25 DIAGNOSIS — M25519 Pain in unspecified shoulder: Secondary | ICD-10-CM | POA: Diagnosis not present

## 2011-12-28 DIAGNOSIS — M719 Bursopathy, unspecified: Secondary | ICD-10-CM | POA: Diagnosis not present

## 2011-12-28 DIAGNOSIS — M25519 Pain in unspecified shoulder: Secondary | ICD-10-CM | POA: Diagnosis not present

## 2011-12-28 DIAGNOSIS — M67919 Unspecified disorder of synovium and tendon, unspecified shoulder: Secondary | ICD-10-CM | POA: Diagnosis not present

## 2011-12-30 DIAGNOSIS — M5126 Other intervertebral disc displacement, lumbar region: Secondary | ICD-10-CM | POA: Diagnosis not present

## 2011-12-31 DIAGNOSIS — M25519 Pain in unspecified shoulder: Secondary | ICD-10-CM | POA: Diagnosis not present

## 2011-12-31 DIAGNOSIS — M719 Bursopathy, unspecified: Secondary | ICD-10-CM | POA: Diagnosis not present

## 2011-12-31 DIAGNOSIS — M67919 Unspecified disorder of synovium and tendon, unspecified shoulder: Secondary | ICD-10-CM | POA: Diagnosis not present

## 2012-01-04 DIAGNOSIS — M67919 Unspecified disorder of synovium and tendon, unspecified shoulder: Secondary | ICD-10-CM | POA: Diagnosis not present

## 2012-01-04 DIAGNOSIS — M25519 Pain in unspecified shoulder: Secondary | ICD-10-CM | POA: Diagnosis not present

## 2012-01-05 DIAGNOSIS — M25519 Pain in unspecified shoulder: Secondary | ICD-10-CM | POA: Diagnosis not present

## 2012-01-05 DIAGNOSIS — M719 Bursopathy, unspecified: Secondary | ICD-10-CM | POA: Diagnosis not present

## 2012-01-05 DIAGNOSIS — M67919 Unspecified disorder of synovium and tendon, unspecified shoulder: Secondary | ICD-10-CM | POA: Diagnosis not present

## 2012-01-11 DIAGNOSIS — M719 Bursopathy, unspecified: Secondary | ICD-10-CM | POA: Diagnosis not present

## 2012-01-11 DIAGNOSIS — M25519 Pain in unspecified shoulder: Secondary | ICD-10-CM | POA: Diagnosis not present

## 2012-01-11 DIAGNOSIS — M67919 Unspecified disorder of synovium and tendon, unspecified shoulder: Secondary | ICD-10-CM | POA: Diagnosis not present

## 2012-01-12 DIAGNOSIS — M25519 Pain in unspecified shoulder: Secondary | ICD-10-CM | POA: Diagnosis not present

## 2012-01-12 DIAGNOSIS — M67919 Unspecified disorder of synovium and tendon, unspecified shoulder: Secondary | ICD-10-CM | POA: Diagnosis not present

## 2012-01-19 DIAGNOSIS — M25519 Pain in unspecified shoulder: Secondary | ICD-10-CM | POA: Diagnosis not present

## 2012-01-19 DIAGNOSIS — M719 Bursopathy, unspecified: Secondary | ICD-10-CM | POA: Diagnosis not present

## 2012-01-19 DIAGNOSIS — M67919 Unspecified disorder of synovium and tendon, unspecified shoulder: Secondary | ICD-10-CM | POA: Diagnosis not present

## 2012-01-21 DIAGNOSIS — M25519 Pain in unspecified shoulder: Secondary | ICD-10-CM | POA: Diagnosis not present

## 2012-01-26 DIAGNOSIS — M67919 Unspecified disorder of synovium and tendon, unspecified shoulder: Secondary | ICD-10-CM | POA: Diagnosis not present

## 2012-01-26 DIAGNOSIS — M25519 Pain in unspecified shoulder: Secondary | ICD-10-CM | POA: Diagnosis not present

## 2012-01-28 DIAGNOSIS — M25519 Pain in unspecified shoulder: Secondary | ICD-10-CM | POA: Diagnosis not present

## 2012-01-28 DIAGNOSIS — M719 Bursopathy, unspecified: Secondary | ICD-10-CM | POA: Diagnosis not present

## 2012-01-28 DIAGNOSIS — M67919 Unspecified disorder of synovium and tendon, unspecified shoulder: Secondary | ICD-10-CM | POA: Diagnosis not present

## 2012-02-01 DIAGNOSIS — M67919 Unspecified disorder of synovium and tendon, unspecified shoulder: Secondary | ICD-10-CM | POA: Diagnosis not present

## 2012-02-01 DIAGNOSIS — M719 Bursopathy, unspecified: Secondary | ICD-10-CM | POA: Diagnosis not present

## 2012-02-01 DIAGNOSIS — M25519 Pain in unspecified shoulder: Secondary | ICD-10-CM | POA: Diagnosis not present

## 2012-02-02 DIAGNOSIS — M719 Bursopathy, unspecified: Secondary | ICD-10-CM | POA: Diagnosis not present

## 2012-02-02 DIAGNOSIS — M25519 Pain in unspecified shoulder: Secondary | ICD-10-CM | POA: Diagnosis not present

## 2012-02-02 DIAGNOSIS — M67919 Unspecified disorder of synovium and tendon, unspecified shoulder: Secondary | ICD-10-CM | POA: Diagnosis not present

## 2012-02-03 DIAGNOSIS — Z79899 Other long term (current) drug therapy: Secondary | ICD-10-CM | POA: Diagnosis not present

## 2012-02-03 DIAGNOSIS — I2510 Atherosclerotic heart disease of native coronary artery without angina pectoris: Secondary | ICD-10-CM | POA: Diagnosis not present

## 2012-02-03 DIAGNOSIS — R413 Other amnesia: Secondary | ICD-10-CM | POA: Diagnosis not present

## 2012-02-03 DIAGNOSIS — I10 Essential (primary) hypertension: Secondary | ICD-10-CM | POA: Diagnosis not present

## 2012-02-03 DIAGNOSIS — Z823 Family history of stroke: Secondary | ICD-10-CM | POA: Diagnosis not present

## 2012-02-03 DIAGNOSIS — M47817 Spondylosis without myelopathy or radiculopathy, lumbosacral region: Secondary | ICD-10-CM | POA: Diagnosis not present

## 2012-02-03 DIAGNOSIS — E785 Hyperlipidemia, unspecified: Secondary | ICD-10-CM | POA: Diagnosis not present

## 2012-02-06 ENCOUNTER — Other Ambulatory Visit: Payer: Self-pay | Admitting: Internal Medicine

## 2012-02-09 DIAGNOSIS — M67919 Unspecified disorder of synovium and tendon, unspecified shoulder: Secondary | ICD-10-CM | POA: Diagnosis not present

## 2012-02-09 DIAGNOSIS — M719 Bursopathy, unspecified: Secondary | ICD-10-CM | POA: Diagnosis not present

## 2012-02-09 DIAGNOSIS — M25519 Pain in unspecified shoulder: Secondary | ICD-10-CM | POA: Diagnosis not present

## 2012-02-11 DIAGNOSIS — M67919 Unspecified disorder of synovium and tendon, unspecified shoulder: Secondary | ICD-10-CM | POA: Diagnosis not present

## 2012-02-11 DIAGNOSIS — M719 Bursopathy, unspecified: Secondary | ICD-10-CM | POA: Diagnosis not present

## 2012-02-11 DIAGNOSIS — M25519 Pain in unspecified shoulder: Secondary | ICD-10-CM | POA: Diagnosis not present

## 2012-02-16 DIAGNOSIS — M719 Bursopathy, unspecified: Secondary | ICD-10-CM | POA: Diagnosis not present

## 2012-02-16 DIAGNOSIS — M67919 Unspecified disorder of synovium and tendon, unspecified shoulder: Secondary | ICD-10-CM | POA: Diagnosis not present

## 2012-02-16 DIAGNOSIS — M25519 Pain in unspecified shoulder: Secondary | ICD-10-CM | POA: Diagnosis not present

## 2012-02-18 DIAGNOSIS — M67919 Unspecified disorder of synovium and tendon, unspecified shoulder: Secondary | ICD-10-CM | POA: Diagnosis not present

## 2012-02-18 DIAGNOSIS — M719 Bursopathy, unspecified: Secondary | ICD-10-CM | POA: Diagnosis not present

## 2012-02-18 DIAGNOSIS — M25519 Pain in unspecified shoulder: Secondary | ICD-10-CM | POA: Diagnosis not present

## 2012-02-25 DIAGNOSIS — M719 Bursopathy, unspecified: Secondary | ICD-10-CM | POA: Diagnosis not present

## 2012-02-25 DIAGNOSIS — M25519 Pain in unspecified shoulder: Secondary | ICD-10-CM | POA: Diagnosis not present

## 2012-02-25 DIAGNOSIS — Z23 Encounter for immunization: Secondary | ICD-10-CM | POA: Diagnosis not present

## 2012-02-25 DIAGNOSIS — M67919 Unspecified disorder of synovium and tendon, unspecified shoulder: Secondary | ICD-10-CM | POA: Diagnosis not present

## 2012-02-26 DIAGNOSIS — F079 Unspecified personality and behavioral disorder due to known physiological condition: Secondary | ICD-10-CM | POA: Diagnosis not present

## 2012-03-01 DIAGNOSIS — M719 Bursopathy, unspecified: Secondary | ICD-10-CM | POA: Diagnosis not present

## 2012-03-01 DIAGNOSIS — M25519 Pain in unspecified shoulder: Secondary | ICD-10-CM | POA: Diagnosis not present

## 2012-03-01 DIAGNOSIS — M67919 Unspecified disorder of synovium and tendon, unspecified shoulder: Secondary | ICD-10-CM | POA: Diagnosis not present

## 2012-03-02 ENCOUNTER — Other Ambulatory Visit: Payer: Self-pay | Admitting: Neurology

## 2012-03-02 DIAGNOSIS — F09 Unspecified mental disorder due to known physiological condition: Secondary | ICD-10-CM

## 2012-03-03 DIAGNOSIS — M25519 Pain in unspecified shoulder: Secondary | ICD-10-CM | POA: Diagnosis not present

## 2012-03-03 DIAGNOSIS — M67919 Unspecified disorder of synovium and tendon, unspecified shoulder: Secondary | ICD-10-CM | POA: Diagnosis not present

## 2012-03-03 DIAGNOSIS — M719 Bursopathy, unspecified: Secondary | ICD-10-CM | POA: Diagnosis not present

## 2012-03-08 DIAGNOSIS — M719 Bursopathy, unspecified: Secondary | ICD-10-CM | POA: Diagnosis not present

## 2012-03-08 DIAGNOSIS — M25519 Pain in unspecified shoulder: Secondary | ICD-10-CM | POA: Diagnosis not present

## 2012-03-08 DIAGNOSIS — M67919 Unspecified disorder of synovium and tendon, unspecified shoulder: Secondary | ICD-10-CM | POA: Diagnosis not present

## 2012-03-09 DIAGNOSIS — M25519 Pain in unspecified shoulder: Secondary | ICD-10-CM | POA: Diagnosis not present

## 2012-03-09 DIAGNOSIS — M67919 Unspecified disorder of synovium and tendon, unspecified shoulder: Secondary | ICD-10-CM | POA: Diagnosis not present

## 2012-03-10 ENCOUNTER — Ambulatory Visit
Admission: RE | Admit: 2012-03-10 | Discharge: 2012-03-10 | Disposition: A | Discharge status: Home / Self Care | Payer: Medicare Other | Source: Ambulatory Visit | Attending: Neurology | Admitting: Neurology

## 2012-03-10 DIAGNOSIS — F079 Unspecified personality and behavioral disorder due to known physiological condition: Secondary | ICD-10-CM | POA: Diagnosis not present

## 2012-03-10 DIAGNOSIS — F09 Unspecified mental disorder due to known physiological condition: Secondary | ICD-10-CM

## 2012-03-14 DIAGNOSIS — M67919 Unspecified disorder of synovium and tendon, unspecified shoulder: Secondary | ICD-10-CM | POA: Diagnosis not present

## 2012-03-14 DIAGNOSIS — M25519 Pain in unspecified shoulder: Secondary | ICD-10-CM | POA: Diagnosis not present

## 2012-03-28 DIAGNOSIS — H6120 Impacted cerumen, unspecified ear: Secondary | ICD-10-CM | POA: Diagnosis not present

## 2012-03-30 DIAGNOSIS — M5126 Other intervertebral disc displacement, lumbar region: Secondary | ICD-10-CM | POA: Diagnosis not present

## 2012-03-31 DIAGNOSIS — Z7901 Long term (current) use of anticoagulants: Secondary | ICD-10-CM | POA: Diagnosis not present

## 2012-03-31 DIAGNOSIS — K921 Melena: Secondary | ICD-10-CM | POA: Diagnosis not present

## 2012-03-31 DIAGNOSIS — Z79899 Other long term (current) drug therapy: Secondary | ICD-10-CM | POA: Diagnosis not present

## 2012-04-12 DIAGNOSIS — M719 Bursopathy, unspecified: Secondary | ICD-10-CM | POA: Diagnosis not present

## 2012-04-12 DIAGNOSIS — Z23 Encounter for immunization: Secondary | ICD-10-CM | POA: Diagnosis not present

## 2012-04-12 DIAGNOSIS — M67919 Unspecified disorder of synovium and tendon, unspecified shoulder: Secondary | ICD-10-CM | POA: Diagnosis not present

## 2012-04-12 DIAGNOSIS — M810 Age-related osteoporosis without current pathological fracture: Secondary | ICD-10-CM | POA: Diagnosis not present

## 2012-04-12 DIAGNOSIS — M159 Polyosteoarthritis, unspecified: Secondary | ICD-10-CM | POA: Diagnosis not present

## 2012-05-17 ENCOUNTER — Other Ambulatory Visit: Payer: Self-pay | Admitting: Internal Medicine

## 2012-05-17 DIAGNOSIS — Z1231 Encounter for screening mammogram for malignant neoplasm of breast: Secondary | ICD-10-CM

## 2012-06-23 ENCOUNTER — Other Ambulatory Visit: Payer: Self-pay | Admitting: Family Medicine

## 2012-06-23 DIAGNOSIS — N390 Urinary tract infection, site not specified: Secondary | ICD-10-CM | POA: Diagnosis not present

## 2012-06-23 DIAGNOSIS — R413 Other amnesia: Secondary | ICD-10-CM | POA: Diagnosis not present

## 2012-06-23 DIAGNOSIS — I10 Essential (primary) hypertension: Secondary | ICD-10-CM | POA: Diagnosis not present

## 2012-06-23 DIAGNOSIS — I2510 Atherosclerotic heart disease of native coronary artery without angina pectoris: Secondary | ICD-10-CM | POA: Diagnosis not present

## 2012-06-23 DIAGNOSIS — E785 Hyperlipidemia, unspecified: Secondary | ICD-10-CM | POA: Diagnosis not present

## 2012-06-23 DIAGNOSIS — Z79899 Other long term (current) drug therapy: Secondary | ICD-10-CM | POA: Diagnosis not present

## 2012-06-23 DIAGNOSIS — M810 Age-related osteoporosis without current pathological fracture: Secondary | ICD-10-CM

## 2012-06-23 DIAGNOSIS — Z780 Asymptomatic menopausal state: Secondary | ICD-10-CM

## 2012-07-01 ENCOUNTER — Telehealth: Payer: Self-pay | Admitting: Oncology

## 2012-07-01 ENCOUNTER — Ambulatory Visit
Admission: RE | Admit: 2012-07-01 | Discharge: 2012-07-01 | Disposition: A | Discharge status: Home / Self Care | Payer: Medicare Other | Source: Ambulatory Visit | Attending: Internal Medicine | Admitting: Internal Medicine

## 2012-07-01 ENCOUNTER — Ambulatory Visit
Admission: RE | Admit: 2012-07-01 | Discharge: 2012-07-01 | Disposition: A | Discharge status: Home / Self Care | Payer: Medicare Other | Source: Ambulatory Visit | Attending: Family Medicine | Admitting: Family Medicine

## 2012-07-01 ENCOUNTER — Other Ambulatory Visit: Payer: Medicare Other | Admitting: Lab

## 2012-07-01 ENCOUNTER — Ambulatory Visit: Payer: Self-pay | Admitting: Nurse Practitioner

## 2012-07-01 DIAGNOSIS — Z780 Asymptomatic menopausal state: Secondary | ICD-10-CM

## 2012-07-01 DIAGNOSIS — M810 Age-related osteoporosis without current pathological fracture: Secondary | ICD-10-CM

## 2012-07-01 DIAGNOSIS — Z1231 Encounter for screening mammogram for malignant neoplasm of breast: Secondary | ICD-10-CM

## 2012-07-01 DIAGNOSIS — M899 Disorder of bone, unspecified: Secondary | ICD-10-CM | POA: Diagnosis not present

## 2012-07-01 NOTE — Telephone Encounter (Signed)
Returned dtr's call re cx 1/17 appt. appt cx'd per dtr Lear Ng - pt seen by primary had lb and all is well - if needed will call back to r/s - lm for desk nurse

## 2012-07-02 ENCOUNTER — Other Ambulatory Visit: Payer: Self-pay | Admitting: Internal Medicine

## 2012-07-04 ENCOUNTER — Other Ambulatory Visit: Payer: Self-pay | Admitting: *Deleted

## 2012-07-04 MED ORDER — VALSARTAN 80 MG PO TABS: 80.0000 mg | ORAL_TABLET | Freq: Every day | ORAL | Status: DC

## 2012-07-21 ENCOUNTER — Other Ambulatory Visit: Payer: Self-pay | Admitting: Internal Medicine

## 2012-08-05 ENCOUNTER — Telehealth: Payer: Self-pay | Admitting: Oncology

## 2012-08-05 NOTE — Telephone Encounter (Signed)
Pt's daughter called and r/s appt from January 2014 to [facility] ,lab and ML

## 2012-08-12 ENCOUNTER — Other Ambulatory Visit: Payer: Self-pay | Admitting: *Deleted

## 2012-08-12 DIAGNOSIS — D509 Iron deficiency anemia, unspecified: Secondary | ICD-10-CM

## 2012-08-12 DIAGNOSIS — Z0000 Encounter for general adult medical examination without abnormal findings: Secondary | ICD-10-CM

## 2012-08-15 ENCOUNTER — Ambulatory Visit (HOSPITAL_BASED_OUTPATIENT_CLINIC_OR_DEPARTMENT_OTHER): Payer: Medicare Other | Admitting: Nurse Practitioner

## 2012-08-15 ENCOUNTER — Other Ambulatory Visit (HOSPITAL_BASED_OUTPATIENT_CLINIC_OR_DEPARTMENT_OTHER): Payer: Medicare Other | Admitting: Lab

## 2012-08-15 ENCOUNTER — Telehealth: Payer: Self-pay | Admitting: Oncology

## 2012-08-15 VITALS — BP 114/47 | HR 67 | Temp 97.8°F | Resp 18 | Wt 103.9 lb

## 2012-08-15 DIAGNOSIS — D509 Iron deficiency anemia, unspecified: Secondary | ICD-10-CM

## 2012-08-15 LAB — CBC WITH DIFFERENTIAL/PLATELET
BASO%: 1.2 % (ref 0.0–2.0)
Basophils Absolute: 0.1 10*3/uL (ref 0.0–0.1)
EOS%: 5.2 % (ref 0.0–7.0)
Eosinophils Absolute: 0.3 10*3/uL (ref 0.0–0.5)
HCT: 36 % (ref 34.8–46.6)
HGB: 11.7 g/dL (ref 11.6–15.9)
LYMPH%: 46.4 % (ref 14.0–49.7)
MCH: 27.9 pg (ref 25.1–34.0)
MCHC: 32.4 g/dL (ref 31.5–36.0)
MCV: 86 fL (ref 79.5–101.0)
MONO#: 0.8 10*3/uL (ref 0.1–0.9)
MONO%: 12.8 % (ref 0.0–14.0)
NEUT#: 2.1 10*3/uL (ref 1.5–6.5)
NEUT%: 34.4 % — ABNORMAL LOW (ref 38.4–76.8)
Platelets: 159 10*3/uL (ref <2.0)
RBC: 4.18 10*6/uL (ref 3.70–5.45)
RDW: 14.4 % (ref 11.2–14.5)
WBC: 6 10*3/uL (ref 3.9–10.3)
lymph#: 2.8 10*3/uL (ref 0.9–3.3)

## 2012-08-15 NOTE — Telephone Encounter (Signed)
gv and printed appt scheduel for pt for June and Sept

## 2012-08-15 NOTE — Progress Notes (Signed)
OFFICE PROGRESS NOTE  Interval history:  Ms. Wiesman is an 79 year old woman with a history of iron deficiency anemia. At the time of her last visit on 07/02/2011 the hemoglobin returned at 13.8.  She is accompanied by her daughter. They report she is losing weight unintentionally. In October 2013 she weighed approximately 110 pounds. Appetite is poor. She is vomiting periodically but is not nauseated. This has been occurring over the past several months. 2-3 months ago she noted dark stools. She is no longer having dark stools. No fevers or sweats. She denies pain.   Objective: Blood pressure 114/47, pulse 67, temperature 97.8 F (36.6 C), temperature source Oral, resp. rate 18, weight 103 lb 14.4 oz (47.129 kg).  Oropharynx is without thrush or ulceration. No palpable cervical, supraclavicular or left axillary lymph nodes. Approximate 1 cm soft, mobile right axillary lymph node. Lungs are clear. Regular cardiac rhythm. Abdomen is soft and nontender. No organomegaly. Extremities are without edema. Stool in the rectal vault. Hemoccult negative.  Lab Results: Lab Results  Component Value Date   WBC 6.0 08/15/2012   HGB 11.7 08/15/2012   HCT 36.0 08/15/2012   MCV 86.0 08/15/2012   PLT 159 08/15/2012    Chemistry:    Chemistry      Component Value Date/Time   NA 142 07/30/2011 1058   K 3.7 07/30/2011 1058   CL 105 07/30/2011 1058   CO2 30 07/30/2011 1058   BUN 10 07/30/2011 1058   CREATININE 0.8 07/30/2011 1058      Component Value Date/Time   CALCIUM 9.0 07/30/2011 1058   ALKPHOS 59 07/30/2011 1058   ALKPHOS 59 07/30/2011 1058   AST 27 07/30/2011 1058   AST 27 07/30/2011 1058   ALT 21 07/30/2011 1058   ALT 21 07/30/2011 1058   BILITOT 0.9 07/30/2011 1058   BILITOT 0.9 07/30/2011 1058       Studies/Results: No results found.  Medications: I have reviewed the patient's current medications.  Assessment/Plan:  1. History of iron deficiency anemia.  2. History of anorexia/weight loss. She has  recurrent anorexia/weight loss. 3. History of early satiety, status post an EGD evaluation by Dr. Jarold Motto.  4. History of bilateral axillary adenopathy, status post a biopsy 04/21/2005 with benign pathology. 5. Status post cholecystectomy. 6. Large broad based foraminal extraforaminal disc protrusion on the right side at L4-L5, creating significant foraminal stenosis on an MRI 05/07/2008, status post several injections with improvement in pain. 7. Negative mammogram 06/26/2011, 07/01/2012. 8. History of an MI. 9. Hypertension. 10. Hyperlipidemia. 11. Intermittent vomiting. 12. Recent black stools. Hemoccult negative today.  Disposition-Ms. Fromme is losing weight unintentionally. The cause of the weight loss is unclear. She is experiencing intermittent vomiting and several months ago had black stools. Hemoglobin is down as compared to 1 year ago.  I discussed the above with Dr. Truett Perna. We recommended to Ms. Carp and her daughter that she followup with Dr. Jillyn Hidden regarding the weight loss and GI symptoms.  She will return for a CBC and ferritin in 3 months and a followup visit in 6 months. She will contact the office in the interim with any problems.    Lonna Cobb ANP/GNP-BC

## 2012-08-25 DIAGNOSIS — F079 Unspecified personality and behavioral disorder due to known physiological condition: Secondary | ICD-10-CM | POA: Diagnosis not present

## 2012-09-02 DIAGNOSIS — I2510 Atherosclerotic heart disease of native coronary artery without angina pectoris: Secondary | ICD-10-CM | POA: Diagnosis not present

## 2012-09-02 DIAGNOSIS — M47812 Spondylosis without myelopathy or radiculopathy, cervical region: Secondary | ICD-10-CM | POA: Diagnosis not present

## 2012-09-02 DIAGNOSIS — E538 Deficiency of other specified B group vitamins: Secondary | ICD-10-CM | POA: Diagnosis not present

## 2012-09-02 DIAGNOSIS — R82998 Other abnormal findings in urine: Secondary | ICD-10-CM | POA: Diagnosis not present

## 2012-09-02 DIAGNOSIS — I10 Essential (primary) hypertension: Secondary | ICD-10-CM | POA: Diagnosis not present

## 2012-09-02 DIAGNOSIS — Z823 Family history of stroke: Secondary | ICD-10-CM | POA: Diagnosis not present

## 2012-09-02 DIAGNOSIS — Z79899 Other long term (current) drug therapy: Secondary | ICD-10-CM | POA: Diagnosis not present

## 2012-09-02 DIAGNOSIS — E559 Vitamin D deficiency, unspecified: Secondary | ICD-10-CM | POA: Diagnosis not present

## 2012-09-02 DIAGNOSIS — E785 Hyperlipidemia, unspecified: Secondary | ICD-10-CM | POA: Diagnosis not present

## 2012-09-02 DIAGNOSIS — R634 Abnormal weight loss: Secondary | ICD-10-CM | POA: Diagnosis not present

## 2012-09-02 DIAGNOSIS — R413 Other amnesia: Secondary | ICD-10-CM | POA: Diagnosis not present

## 2012-09-15 DIAGNOSIS — R634 Abnormal weight loss: Secondary | ICD-10-CM | POA: Diagnosis not present

## 2012-09-16 ENCOUNTER — Encounter: Payer: Self-pay | Admitting: Pharmacist Clinician (PhC)/ Clinical Pharmacy Specialist

## 2012-09-21 ENCOUNTER — Encounter: Payer: Self-pay | Admitting: Cardiovascular Disease

## 2012-09-26 DIAGNOSIS — H6120 Impacted cerumen, unspecified ear: Secondary | ICD-10-CM | POA: Diagnosis not present

## 2012-10-12 DIAGNOSIS — M5126 Other intervertebral disc displacement, lumbar region: Secondary | ICD-10-CM | POA: Diagnosis not present

## 2012-10-13 DIAGNOSIS — I2510 Atherosclerotic heart disease of native coronary artery without angina pectoris: Secondary | ICD-10-CM | POA: Diagnosis not present

## 2012-10-13 DIAGNOSIS — I119 Hypertensive heart disease without heart failure: Secondary | ICD-10-CM | POA: Diagnosis not present

## 2012-10-13 DIAGNOSIS — E782 Mixed hyperlipidemia: Secondary | ICD-10-CM | POA: Diagnosis not present

## 2012-10-20 ENCOUNTER — Other Ambulatory Visit: Payer: Self-pay | Admitting: Internal Medicine

## 2012-11-15 ENCOUNTER — Other Ambulatory Visit (HOSPITAL_BASED_OUTPATIENT_CLINIC_OR_DEPARTMENT_OTHER): Payer: Medicare Other | Admitting: Lab

## 2012-11-15 DIAGNOSIS — D509 Iron deficiency anemia, unspecified: Secondary | ICD-10-CM | POA: Diagnosis not present

## 2012-11-15 LAB — CBC WITH DIFFERENTIAL/PLATELET
BASO%: 0.8 % (ref 0.0–2.0)
Basophils Absolute: 0 10*3/uL (ref 0.0–0.1)
EOS%: 7.9 % — ABNORMAL HIGH (ref 0.0–7.0)
Eosinophils Absolute: 0.4 10*3/uL (ref 0.0–0.5)
HCT: 36.8 % (ref 34.8–46.6)
HGB: 12.1 g/dL (ref 11.6–15.9)
LYMPH%: 38.8 % (ref 14.0–49.7)
MCH: 28 pg (ref 25.1–34.0)
MCHC: 32.8 g/dL (ref 31.5–36.0)
MCV: 85.4 fL (ref 79.5–101.0)
MONO#: 0.7 10*3/uL (ref 0.1–0.9)
MONO%: 13.3 % (ref 0.0–14.0)
NEUT#: 2 10*3/uL (ref 1.5–6.5)
NEUT%: 39.2 % (ref 38.4–76.8)
Platelets: 155 10*3/uL (ref 145–400)
RBC: 4.31 10*6/uL (ref 3.70–5.45)
RDW: 14.5 % (ref 11.2–14.5)
WBC: 5.1 10*3/uL (ref 3.9–10.3)
lymph#: 2 10*3/uL (ref 0.9–3.3)

## 2012-11-15 LAB — FERRITIN: Ferritin: 510 ng/mL — ABNORMAL HIGH (ref 10–291)

## 2012-11-28 DIAGNOSIS — L259 Unspecified contact dermatitis, unspecified cause: Secondary | ICD-10-CM | POA: Diagnosis not present

## 2012-12-01 DIAGNOSIS — K219 Gastro-esophageal reflux disease without esophagitis: Secondary | ICD-10-CM | POA: Diagnosis not present

## 2012-12-01 DIAGNOSIS — Z79899 Other long term (current) drug therapy: Secondary | ICD-10-CM | POA: Diagnosis not present

## 2012-12-01 DIAGNOSIS — M069 Rheumatoid arthritis, unspecified: Secondary | ICD-10-CM | POA: Diagnosis not present

## 2012-12-01 DIAGNOSIS — E785 Hyperlipidemia, unspecified: Secondary | ICD-10-CM | POA: Diagnosis not present

## 2012-12-01 DIAGNOSIS — E538 Deficiency of other specified B group vitamins: Secondary | ICD-10-CM | POA: Diagnosis not present

## 2012-12-01 DIAGNOSIS — R82998 Other abnormal findings in urine: Secondary | ICD-10-CM | POA: Diagnosis not present

## 2012-12-01 DIAGNOSIS — R413 Other amnesia: Secondary | ICD-10-CM | POA: Diagnosis not present

## 2012-12-01 DIAGNOSIS — I2510 Atherosclerotic heart disease of native coronary artery without angina pectoris: Secondary | ICD-10-CM | POA: Diagnosis not present

## 2012-12-01 DIAGNOSIS — I10 Essential (primary) hypertension: Secondary | ICD-10-CM | POA: Diagnosis not present

## 2012-12-01 DIAGNOSIS — Z7901 Long term (current) use of anticoagulants: Secondary | ICD-10-CM | POA: Diagnosis not present

## 2012-12-01 DIAGNOSIS — M810 Age-related osteoporosis without current pathological fracture: Secondary | ICD-10-CM | POA: Diagnosis not present

## 2012-12-23 DIAGNOSIS — M19049 Primary osteoarthritis, unspecified hand: Secondary | ICD-10-CM | POA: Diagnosis not present

## 2012-12-23 DIAGNOSIS — M6530 Trigger finger, unspecified finger: Secondary | ICD-10-CM | POA: Diagnosis not present

## 2013-01-23 DIAGNOSIS — M6530 Trigger finger, unspecified finger: Secondary | ICD-10-CM | POA: Diagnosis not present

## 2013-02-06 ENCOUNTER — Other Ambulatory Visit: Payer: Self-pay | Admitting: Internal Medicine

## 2013-02-09 ENCOUNTER — Other Ambulatory Visit: Payer: Self-pay | Admitting: Internal Medicine

## 2013-02-16 ENCOUNTER — Other Ambulatory Visit (HOSPITAL_BASED_OUTPATIENT_CLINIC_OR_DEPARTMENT_OTHER): Payer: Medicare Other | Admitting: Lab

## 2013-02-16 ENCOUNTER — Ambulatory Visit (HOSPITAL_BASED_OUTPATIENT_CLINIC_OR_DEPARTMENT_OTHER): Payer: Medicare Other | Admitting: Oncology

## 2013-02-16 VITALS — BP 122/62 | HR 67 | Temp 97.9°F | Resp 18 | Ht 61.0 in | Wt 115.5 lb

## 2013-02-16 DIAGNOSIS — D509 Iron deficiency anemia, unspecified: Secondary | ICD-10-CM

## 2013-02-16 DIAGNOSIS — Z862 Personal history of diseases of the blood and blood-forming organs and certain disorders involving the immune mechanism: Secondary | ICD-10-CM

## 2013-02-16 DIAGNOSIS — Z23 Encounter for immunization: Secondary | ICD-10-CM | POA: Diagnosis not present

## 2013-02-16 LAB — CBC WITH DIFFERENTIAL/PLATELET
BASO%: 0.2 % (ref 0.0–2.0)
Basophils Absolute: 0 10*3/uL (ref 0.0–0.1)
EOS%: 6.9 % (ref 0.0–7.0)
Eosinophils Absolute: 0.3 10*3/uL (ref 0.0–0.5)
HCT: 38.6 % (ref 34.8–46.6)
HGB: 12.5 g/dL (ref 11.6–15.9)
LYMPH%: 47 % (ref 14.0–49.7)
MCH: 28.1 pg (ref 25.1–34.0)
MCHC: 32.4 g/dL (ref 31.5–36.0)
MCV: 86.8 fL (ref 79.5–101.0)
MONO#: 0.6 10*3/uL (ref 0.1–0.9)
MONO%: 11.3 % (ref 0.0–14.0)
NEUT#: 1.7 10*3/uL (ref 1.5–6.5)
NEUT%: 34.6 % — ABNORMAL LOW (ref 38.4–76.8)
Platelets: 151 10*3/uL (ref 145–400)
RBC: 4.45 10*6/uL (ref 3.70–5.45)
RDW: 13.8 % (ref 11.2–14.5)
WBC: 5 10*3/uL (ref 3.9–10.3)
lymph#: 2.4 10*3/uL (ref 0.9–3.3)

## 2013-02-16 NOTE — Progress Notes (Signed)
   [facility]    OFFICE PROGRESS NOTE   INTERVAL HISTORY:   She returns as scheduled. Good appetite and energy level. No specific complaint. No bleeding.  Objective:  Vital signs in last 24 hours:  Blood pressure 122/62, pulse 67, temperature 97.9 F (36.6 C), temperature source Oral, resp. rate 18, height 5\' 1"  (1.549 m), weight 115 lb 8 oz (52.39 kg). weight 103.9 pounds on 08/15/2012    HEENT: Neck without mass Lymphatics: No cervical, supraclavicular, or inguinal nodes. No right axillary node. 1/2 cm mobile left axillary node. Resp: Lungs clear bilaterally Cardio: Regular rate and rhythm GI: No hepatomegaly, nontender Vascular: No leg edema Breast: Left breast without mass     Lab Results:  Lab Results  Component Value Date   WBC 5.0 02/16/2013   HGB 12.5 02/16/2013   HCT 38.6 02/16/2013   MCV 86.8 02/16/2013   PLT 151 02/16/2013   ANC 1.7   Medications: I have reviewed the patient's current medications.  Assessment/Plan: 1. History of iron deficiency anemia.  2. History of anorexia/weight loss. She has gained weight since she was here in March of 2014. 3. History of early satiety, status post an EGD evaluation by Dr. Jarold Motto.  4. History of bilateral axillary adenopathy, status post a biopsy 04/21/2005 with benign pathology. 5. Status post cholecystectomy. 6. Large broad based foraminal extraforaminal disc protrusion on the right side at L4-L5, creating significant foraminal stenosis on an MRI 05/07/2008, status post several injections with improvement in pain. 7. Negative mammogram 06/26/2011, 07/01/2012. 8. History of an MI. 9. Hypertension. 10. Hyperlipidemia.  Disposition:  She appears stable from a hematologic standpoint. The hemoglobin remains in the normal range. Ms. Noguchi was discharged from the hematology clinic today. We will see her in the future as needed. She plans to continue followup with her primary physician.   Thornton Papas,  MD  02/16/2013  4:11 PM

## 2013-02-17 ENCOUNTER — Telehealth: Payer: Self-pay | Admitting: *Deleted

## 2013-02-17 ENCOUNTER — Telehealth: Payer: Self-pay | Admitting: Oncology

## 2013-02-17 NOTE — Telephone Encounter (Signed)
F/U TBA PER 9/4 POF

## 2013-02-17 NOTE — Telephone Encounter (Signed)
Left message on voicemail for pt to make her aware Dr. Kalman Drape office note was sent to her PCP as requested.

## 2013-02-22 NOTE — Telephone Encounter (Signed)
Pt's daughter, Lear Ng, called informing office pt's PCP changed to Dr. Cain Saupe and to send records there.  Last OV / lab routed to Dr. Jillyn Hidden.  Notified HIM that pt is also requesting copy to be sent to her home.

## 2013-03-02 ENCOUNTER — Ambulatory Visit: Payer: Self-pay | Admitting: Nurse Practitioner

## 2013-03-27 DIAGNOSIS — H6120 Impacted cerumen, unspecified ear: Secondary | ICD-10-CM | POA: Diagnosis not present

## 2013-05-09 ENCOUNTER — Other Ambulatory Visit: Payer: Self-pay | Admitting: Internal Medicine

## 2013-05-29 ENCOUNTER — Other Ambulatory Visit: Payer: Self-pay

## 2013-05-29 DIAGNOSIS — Z1231 Encounter for screening mammogram for malignant neoplasm of breast: Secondary | ICD-10-CM

## 2013-06-29 ENCOUNTER — Ambulatory Visit
Admission: RE | Admit: 2013-06-29 | Discharge: 2013-06-29 | Disposition: A | Discharge status: Home / Self Care | Payer: Medicare Other | Source: Ambulatory Visit | Attending: Family Medicine | Admitting: Family Medicine

## 2013-06-29 ENCOUNTER — Other Ambulatory Visit: Payer: Self-pay | Admitting: Family Medicine

## 2013-06-29 DIAGNOSIS — R413 Other amnesia: Secondary | ICD-10-CM | POA: Diagnosis not present

## 2013-06-29 DIAGNOSIS — E538 Deficiency of other specified B group vitamins: Secondary | ICD-10-CM | POA: Diagnosis not present

## 2013-06-29 DIAGNOSIS — K219 Gastro-esophageal reflux disease without esophagitis: Secondary | ICD-10-CM | POA: Diagnosis not present

## 2013-06-29 DIAGNOSIS — R059 Cough, unspecified: Secondary | ICD-10-CM

## 2013-06-29 DIAGNOSIS — R05 Cough: Secondary | ICD-10-CM

## 2013-06-29 DIAGNOSIS — M899 Disorder of bone, unspecified: Secondary | ICD-10-CM | POA: Diagnosis not present

## 2013-06-29 DIAGNOSIS — I10 Essential (primary) hypertension: Secondary | ICD-10-CM | POA: Diagnosis not present

## 2013-06-29 DIAGNOSIS — M949 Disorder of cartilage, unspecified: Secondary | ICD-10-CM | POA: Diagnosis not present

## 2013-06-29 DIAGNOSIS — I2510 Atherosclerotic heart disease of native coronary artery without angina pectoris: Secondary | ICD-10-CM | POA: Diagnosis not present

## 2013-06-29 DIAGNOSIS — M069 Rheumatoid arthritis, unspecified: Secondary | ICD-10-CM | POA: Diagnosis not present

## 2013-06-29 DIAGNOSIS — E785 Hyperlipidemia, unspecified: Secondary | ICD-10-CM | POA: Diagnosis not present

## 2013-07-01 ENCOUNTER — Other Ambulatory Visit: Payer: Self-pay | Admitting: Internal Medicine

## 2013-07-04 ENCOUNTER — Ambulatory Visit
Admission: RE | Admit: 2013-07-04 | Discharge: 2013-07-04 | Disposition: A | Discharge status: Home / Self Care | Payer: Medicare Other | Source: Ambulatory Visit

## 2013-07-04 DIAGNOSIS — Z1231 Encounter for screening mammogram for malignant neoplasm of breast: Secondary | ICD-10-CM | POA: Diagnosis not present

## 2013-09-26 DIAGNOSIS — H6120 Impacted cerumen, unspecified ear: Secondary | ICD-10-CM | POA: Diagnosis not present

## 2013-10-25 ENCOUNTER — Ambulatory Visit: Payer: Medicare Other | Admitting: Cardiovascular Disease

## 2013-11-16 ENCOUNTER — Ambulatory Visit: Payer: Medicare Other | Admitting: Cardiovascular Disease

## 2013-12-28 DIAGNOSIS — I2510 Atherosclerotic heart disease of native coronary artery without angina pectoris: Secondary | ICD-10-CM | POA: Diagnosis not present

## 2013-12-28 DIAGNOSIS — E538 Deficiency of other specified B group vitamins: Secondary | ICD-10-CM | POA: Diagnosis not present

## 2013-12-28 DIAGNOSIS — I10 Essential (primary) hypertension: Secondary | ICD-10-CM | POA: Diagnosis not present

## 2013-12-28 DIAGNOSIS — E785 Hyperlipidemia, unspecified: Secondary | ICD-10-CM | POA: Diagnosis not present

## 2013-12-28 DIAGNOSIS — K219 Gastro-esophageal reflux disease without esophagitis: Secondary | ICD-10-CM | POA: Diagnosis not present

## 2013-12-28 DIAGNOSIS — M949 Disorder of cartilage, unspecified: Secondary | ICD-10-CM | POA: Diagnosis not present

## 2013-12-28 DIAGNOSIS — Z79899 Other long term (current) drug therapy: Secondary | ICD-10-CM | POA: Diagnosis not present

## 2013-12-28 DIAGNOSIS — R413 Other amnesia: Secondary | ICD-10-CM | POA: Diagnosis not present

## 2013-12-28 DIAGNOSIS — M899 Disorder of bone, unspecified: Secondary | ICD-10-CM | POA: Diagnosis not present

## 2014-01-25 ENCOUNTER — Ambulatory Visit (INDEPENDENT_AMBULATORY_CARE_PROVIDER_SITE_OTHER): Payer: Medicare Other | Admitting: Cardiovascular Disease

## 2014-01-25 ENCOUNTER — Encounter: Payer: Self-pay | Admitting: Cardiovascular Disease

## 2014-01-25 VITALS — BP 134/62 | HR 72 | Ht 61.0 in | Wt 117.5 lb

## 2014-01-25 DIAGNOSIS — I2510 Atherosclerotic heart disease of native coronary artery without angina pectoris: Secondary | ICD-10-CM

## 2014-01-25 DIAGNOSIS — I209 Angina pectoris, unspecified: Secondary | ICD-10-CM | POA: Diagnosis not present

## 2014-01-25 DIAGNOSIS — E785 Hyperlipidemia, unspecified: Secondary | ICD-10-CM

## 2014-01-25 DIAGNOSIS — M069 Rheumatoid arthritis, unspecified: Secondary | ICD-10-CM

## 2014-01-25 DIAGNOSIS — K219 Gastro-esophageal reflux disease without esophagitis: Secondary | ICD-10-CM | POA: Diagnosis not present

## 2014-01-25 DIAGNOSIS — I25119 Atherosclerotic heart disease of native coronary artery with unspecified angina pectoris: Secondary | ICD-10-CM

## 2014-01-25 DIAGNOSIS — I219 Acute myocardial infarction, unspecified: Secondary | ICD-10-CM | POA: Diagnosis not present

## 2014-01-25 DIAGNOSIS — I10 Essential (primary) hypertension: Secondary | ICD-10-CM | POA: Diagnosis not present

## 2014-01-25 NOTE — Patient Instructions (Signed)
Your physician recommends that you schedule a follow-up appointment in: 1 year. No changes were made today in your therapy. 

## 2014-01-25 NOTE — Progress Notes (Signed)
Patient ID: Ashley Willis, female   DOB: 1926-09-11, 80 y.o.   MRN: [id number]     HPI: Ashley Willis is a 80 y.o. female who presents to the office today for a 15 month follow up cardiology evaluation.  Ashley Willis suffered inferior wall myocardial infarction 1989.  At that time, I performed PTCA for proximal RCA, and she was also found to have total occlusion of the distal RCA beyond the PDA branch for which medical therapy was recommended.  She did have concomitant LAD disease of 70-80% and has been aggressively treated over the past 26 years with significant disease stability.  Her last nuclear perfusion study in March 2012 was unchanged from her prior studies and continues to show only a very small area of mild diaphragmatic attenuation versus minimal inferior scar without ischemia.  Additional problems include hypertension, GERD, hyperlipidemia, and rheumatoid arthritis.  She now sees Dr. Lieutenant Diego.  Was replaced.  Dr. Arthur Holms and after his retirement for primary care.  She tells me she did have blood work drawn today.  Over the past year, she remains fairly active.  She still drives.  She denies recurrent episodes of chest pain.  He denies PND or orthopnea.  Past Medical History  Diagnosis Date  . Personal history of colonic polyps 03/03/2010    tubular adenomas  . Vitamin B12 deficiency   . Esophageal reflux   . Anemia   . CAD (coronary artery disease) 07/14/2006    ECHO - EF >55%; borderline concentric LVH; LA mildly dilated, mild tricuspid regurgitation  . Hyperlipemia   . Hypertension   . Pancreatitis   . Rheumatoid arthritis(714.0)   . Osteoporosis, unspecified   . Memory loss   . Calculus of gallbladder without mention of cholecystitis or obstruction   . Diverticulosis of colon (without mention of hemorrhage)   . Stricture and stenosis of esophagus   . Hiatal hernia   . Other symptoms involving abdomen and pelvis(789.9)   . Insomnia, unspecified   . CAD (coronary artery  disease) 08/27/2010    R/P MV - LV normal in size, fixed mid-basal inferior defect is seen, in absence of gated images not possible to distinguish diaphragmatic atenuation from nontransmural infarction; no significant ischemia detected  . Myocardial infarct 1989    inferior wall MI - total occlusionof distal RCA beyond PDA branch, treated w/ medical therapy    Past Surgical History  Procedure Laterality Date  . Appendectomy    . Angioplasty    . Cholecystectomy    . Axillary surgery      Allergies  Allergen Reactions  . Aspirin     REACTION: GI upset  . Codeine     Current Outpatient Prescriptions  Medication Sig Dispense Refill  . aspirin 325 MG tablet Take 325 mg by mouth daily.        Marland Kitchen atorvastatin (LIPITOR) 40 MG tablet TAKE 1 TABLET (40 MG TOTAL) BY MOUTH DAILY.  90 tablet  3  . Calcium Carbonate-Vitamin D (CALTRATE 600+D) 600-400 MG-UNIT per tablet Take 1 tablet by mouth daily.        . cholecalciferol (VITAMIN D) 1000 UNITS tablet Take 2,000 Units by mouth daily.      . cyanocobalamin 500 MCG tablet Take 1,000 mcg by mouth daily.      Marland Kitchen diltiazem (CARDIZEM CD) 180 MG 24 hr capsule TAKE 1 CAPSULE (180 MG TOTAL) BY MOUTH DAILY.  90 capsule  3  . gabapentin (NEURONTIN) 100 MG capsule Take 200 mg by  mouth daily.      . pantoprazole (PROTONIX) 40 MG tablet TAKE 1 TABLET BY MOUTH DAILY  90 tablet  3  . traMADol (ULTRAM) 50 MG tablet Take 50 mg by mouth every 6 (six) hours as needed for pain.      . valsartan (DIOVAN) 80 MG tablet TAKE 1 TABLET (80 MG TOTAL) BY MOUTH DAILY.  90 tablet  3   No current facility-administered medications for this visit.    History   Social History  . Marital Status: Married    Spouse Name: N/A    Number of Children: 4  . Years of Education: N/A   Occupational History  . Retired    Social History Main Topics  . Smoking status: Never Smoker   . Smokeless tobacco: Never Used  . Alcohol Use: No  . Drug Use: No  . Sexual Activity: Not  Currently   Other Topics Concern  . Not on file   Social History Narrative   married 1948. 1 daughter 32; 3 sons 62, 49, 55. 9 grandchildrens; 6 great-grandchildren. marriage in good health. 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 Diabetes Mother   . Colon cancer Neg Hx     ROS General: Negative; No fevers, chills, or night sweats HEENT: Negative; No changes in vision or hearing, sinus congestion, difficulty swallowing Pulmonary: Negative; No cough, wheezing, shortness of breath, hemoptysis Cardiovascular: See HPI: No chest pain, presyncope, syncope, palpatations GI: History of GERD; No nausea, vomiting, diarrhea, or abdominal pain GU: Negative; No dysuria, hematuria, or difficulty voiding Musculoskeletal: Negative; no myalgias, joint pain, or weakness Hematologic: Negative; no easy bruising, bleeding Endocrine: Negative; no heat/cold intolerance; no diabetes, Neuro: Negative; no changes in balance, headaches Skin: Negative; No rashes or skin lesions Psychiatric: Negative; No behavioral problems, depression Sleep: Negative; No snoring,  daytime sleepiness, hypersomnolence, bruxism, restless legs, hypnogognic hallucinations. Other comprehensive 14 point system review is negative   Physical Exam BP 134/62  Pulse 72  Ht 5\' 1"  (1.549 m)  Wt 117 lb 8 oz (53.298 kg)  BMI 22.21 kg/m2 General: Alert, oriented, no distress.  She wears a wig. Skin: normal turgor, no rashes, warm and dry HEENT: Normocephalic, atraumatic. Pupils equal round and reactive to light; sclera anicteric; extraocular muscles intact, No lid lag; Nose without nasal septal hypertrophy; Mouth/Parynx benign; Mallinpatti scale 2 Neck: No JVD, no carotid bruits; normal carotid upstroke Lungs: clear to ausculatation and percussion bilaterally; no wheezing or rales, normal inspiratory and expiratory effort Chest wall: without tenderness to  palpitation Heart: PMI not displaced, RRR, s1 s2 normal, 1/6 systolic murmur, No diastolic murmur, no rubs, gallops, thrills, or heaves Abdomen: soft, nontender; no hepatosplenomehaly, BS+; abdominal aorta nontender and not dilated by palpation. Back: no CVA tenderness Pulses: 2+  Musculoskeletal: full range of motion, normal strength, no joint deformities Extremities: Pulses 2+, no clubbing cyanosis or edema, Homan's sign negative  Neurologic: grossly nonfocal; Cranial nerves grossly wnl Psychologic: Normal mood and affect   ECG (independently read by me): Normal sinus rhythm at 72 beats per minute.  Nonspecific T abnormality.  No change from prior ECG of May 2014  LABS:  BMET    Component Value Date/Time   NA 142 07/30/2011 1058   K 3.7 07/30/2011 1058   CL 105 07/30/2011 1058   CO2 30 07/30/2011 1058   GLUCOSE 88 07/30/2011 1058   BUN 10 07/30/2011 1058   CREATININE 0.8 07/30/2011  1058   CALCIUM 9.0 07/30/2011 1058   GFRNONAA 88.74 08/28/2008 1532     Hepatic Function Panel     Component Value Date/Time   PROT 7.3 07/30/2011 1058   PROT 7.3 07/30/2011 1058   ALBUMIN 3.5 07/30/2011 1058   ALBUMIN 3.5 07/30/2011 1058   AST 27 07/30/2011 1058   AST 27 07/30/2011 1058   ALT 21 07/30/2011 1058   ALT 21 07/30/2011 1058   ALKPHOS 59 07/30/2011 1058   ALKPHOS 59 07/30/2011 1058   BILITOT 0.9 07/30/2011 1058   BILITOT 0.9 07/30/2011 1058   BILIDIR 0.1 07/30/2011 1058     CBC    Component Value Date/Time   WBC 5.0 02/16/2013 0932   WBC 7.6 07/30/2011 1058   RBC 4.45 02/16/2013 0932   RBC 4.67 07/30/2011 1058   HGB 12.5 02/16/2013 0932   HGB 13.6 07/30/2011 1058   HCT 38.6 02/16/2013 0932   HCT 42.2 07/30/2011 1058   PLT 151 02/16/2013 0932   PLT 163.0 07/30/2011 1058   MCV 86.8 02/16/2013 0932   MCV 90.2 07/30/2011 1058   MCH 28.1 02/16/2013 0932   MCHC 32.4 02/16/2013 0932   MCHC 32.3 07/30/2011 1058   RDW 13.8 02/16/2013 0932   RDW 13.9 07/30/2011 1058   LYMPHSABS 2.4 02/16/2013 0932   LYMPHSABS 2.4  07/30/2011 1058   MONOABS 0.6 02/16/2013 0932   MONOABS 0.6 07/30/2011 1058   EOSABS 0.3 02/16/2013 0932   EOSABS 0.1 07/30/2011 1058   BASOSABS 0.0 02/16/2013 0932   BASOSABS 0.0 07/30/2011 1058     BNP No results found for this basename: probnp    Lipid Panel     Component Value Date/Time   CHOL 143 07/30/2011 1058   TRIG 95.0 07/30/2011 1058   HDL 54.80 07/30/2011 1058   CHOLHDL 3 07/30/2011 1058   VLDL 19.0 07/30/2011 1058   LDLCALC 69 07/30/2011 1058     RADIOLOGY: No results found.    ASSESSMENT AND PLAN: Ashley Willis continues to do exceptionally well.  She is now 80 years old and is 26 years status post suffering her inferior wall myocardial infarction at which time I treated her with primary PTCA of her RCA.  She does have an occluded distal RCA and has been on medical therapy for concomitant CAD.  Her lipids have been aggressively treated over the past several decades, which resulted in significant disease.  Stability.  She tells me she did have blood work drawn today.  I will obtain these results.  Her blood pressure today is well controlled on her diltiazem 180 mg in addition to valsartan 80 mg.  She is on Lipitor 40 mg daily and is tolerating this well.  I have recommended she reduce her aspirin from 325 mg to 81 mg.  She takes Protonix on a when necessary basis for GERD.  I will see her in one year for cardiology followup evaluation or sooner if problems arise.     Lennette Bihari, MD, [facility], [place]  01/25/2014 11:16 AM

## 2014-03-27 DIAGNOSIS — H6123 Impacted cerumen, bilateral: Secondary | ICD-10-CM | POA: Diagnosis not present

## 2014-05-07 ENCOUNTER — Other Ambulatory Visit: Payer: Self-pay | Admitting: *Deleted

## 2014-05-07 NOTE — Telephone Encounter (Signed)
Please advise refill? Last filled by [facility]. 

## 2014-06-28 DIAGNOSIS — Z23 Encounter for immunization: Secondary | ICD-10-CM | POA: Diagnosis not present

## 2014-06-28 DIAGNOSIS — E538 Deficiency of other specified B group vitamins: Secondary | ICD-10-CM | POA: Diagnosis not present

## 2014-06-28 DIAGNOSIS — M8580 Other specified disorders of bone density and structure, unspecified site: Secondary | ICD-10-CM | POA: Diagnosis not present

## 2014-06-28 DIAGNOSIS — E559 Vitamin D deficiency, unspecified: Secondary | ICD-10-CM | POA: Diagnosis not present

## 2014-06-28 DIAGNOSIS — Z1239 Encounter for other screening for malignant neoplasm of breast: Secondary | ICD-10-CM | POA: Diagnosis not present

## 2014-06-28 DIAGNOSIS — Z79899 Other long term (current) drug therapy: Secondary | ICD-10-CM | POA: Diagnosis not present

## 2014-06-28 DIAGNOSIS — M069 Rheumatoid arthritis, unspecified: Secondary | ICD-10-CM | POA: Diagnosis not present

## 2014-06-28 DIAGNOSIS — I10 Essential (primary) hypertension: Secondary | ICD-10-CM | POA: Diagnosis not present

## 2014-06-28 DIAGNOSIS — I2510 Atherosclerotic heart disease of native coronary artery without angina pectoris: Secondary | ICD-10-CM | POA: Diagnosis not present

## 2014-06-28 DIAGNOSIS — R8290 Unspecified abnormal findings in urine: Secondary | ICD-10-CM | POA: Diagnosis not present

## 2014-06-28 DIAGNOSIS — Z7901 Long term (current) use of anticoagulants: Secondary | ICD-10-CM | POA: Diagnosis not present

## 2014-06-28 DIAGNOSIS — E785 Hyperlipidemia, unspecified: Secondary | ICD-10-CM | POA: Diagnosis not present

## 2014-07-04 ENCOUNTER — Other Ambulatory Visit: Payer: Self-pay

## 2014-07-04 DIAGNOSIS — Z1231 Encounter for screening mammogram for malignant neoplasm of breast: Secondary | ICD-10-CM

## 2014-07-16 ENCOUNTER — Other Ambulatory Visit: Payer: Self-pay | Admitting: Family Medicine

## 2014-07-16 DIAGNOSIS — M8580 Other specified disorders of bone density and structure, unspecified site: Secondary | ICD-10-CM

## 2014-07-27 ENCOUNTER — Ambulatory Visit
Admission: RE | Admit: 2014-07-27 | Discharge: 2014-07-27 | Disposition: A | Discharge status: Home / Self Care | Payer: Medicare Other | Source: Ambulatory Visit | Attending: Family Medicine | Admitting: Family Medicine

## 2014-07-27 ENCOUNTER — Ambulatory Visit
Admission: RE | Admit: 2014-07-27 | Discharge: 2014-07-27 | Disposition: A | Discharge status: Home / Self Care | Payer: Medicare Other | Source: Ambulatory Visit

## 2014-07-27 DIAGNOSIS — Z1231 Encounter for screening mammogram for malignant neoplasm of breast: Secondary | ICD-10-CM | POA: Diagnosis not present

## 2014-07-27 DIAGNOSIS — Z780 Asymptomatic menopausal state: Secondary | ICD-10-CM | POA: Diagnosis not present

## 2014-07-27 DIAGNOSIS — M8580 Other specified disorders of bone density and structure, unspecified site: Secondary | ICD-10-CM

## 2014-08-10 IMAGING — CT CT HEAD W/O CM
2 series · 16 of 30 positions shown, 20 images · non-contrast
Comparison: none

[Series 3: head bone · axial · 0.49mm/px · z∈[+15,+56]mm · 3 of 28 slices shown]
[im 2/28  bone]
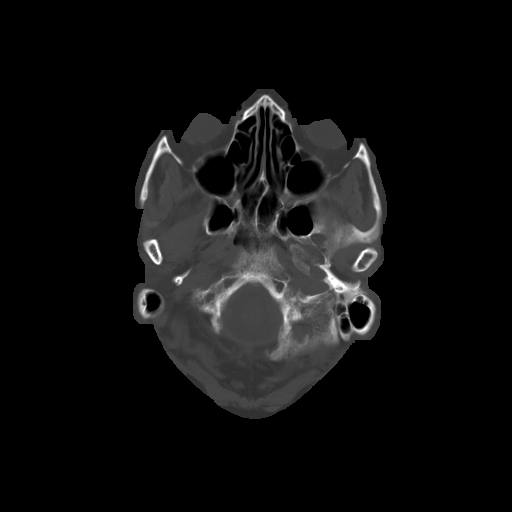
[im 6/28  bone]
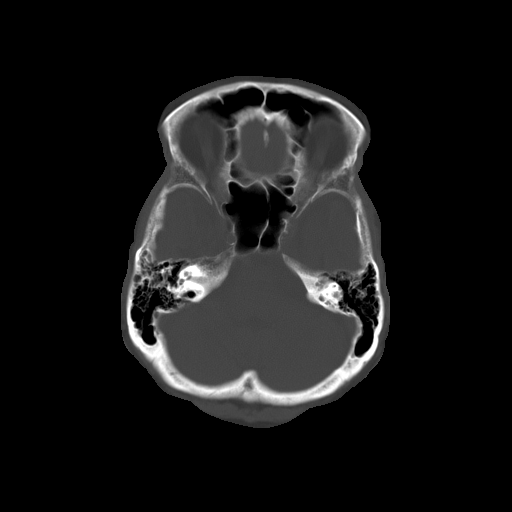
[im 10/28  bone]
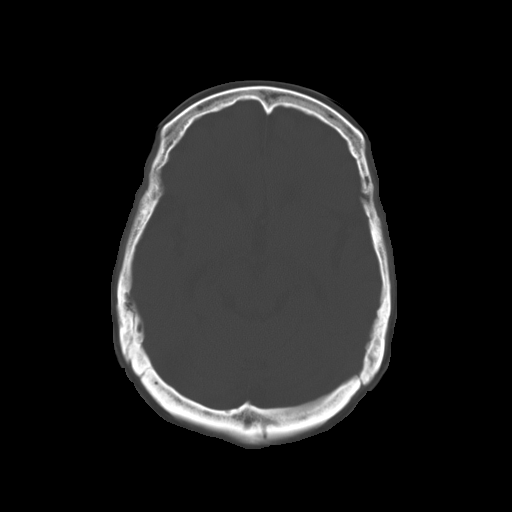

[Series 32: 3d filtered head w/o · axial · non-contrast · 0.49mm/px · z∈[+15,+138]mm · 13 of 28 slices shown, 17 images]
[im 2/28  brain]
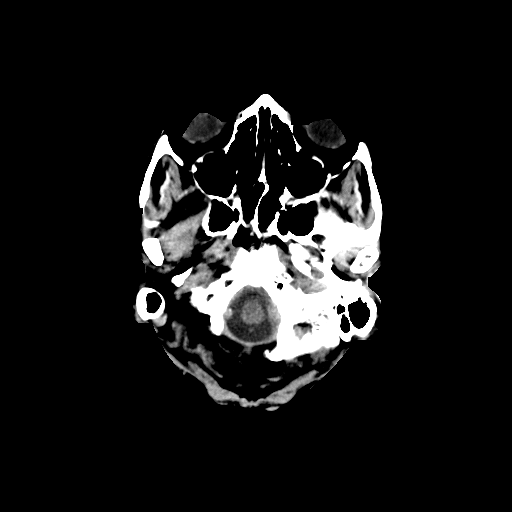
[im 2/28  bone]
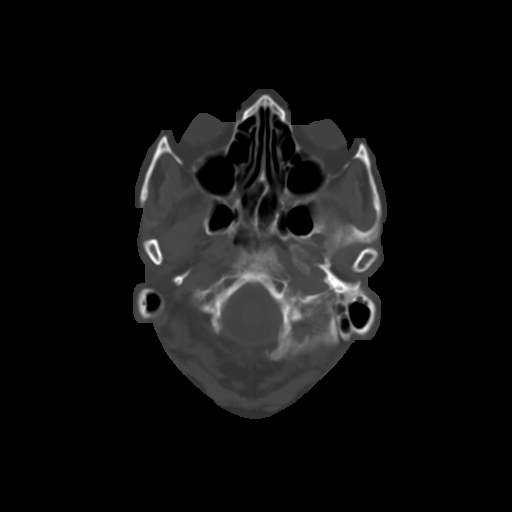
[im 4/28  brain]
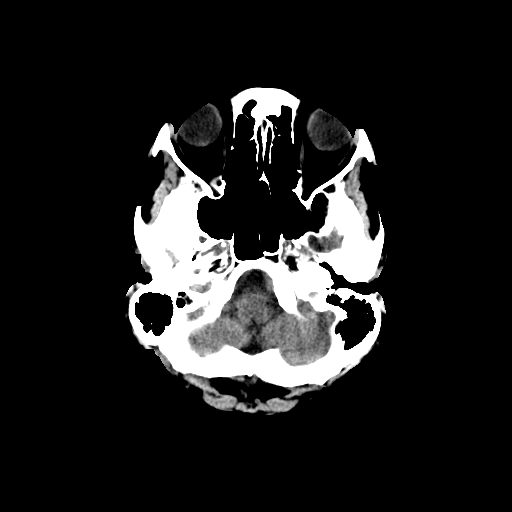
[im 6/28  brain]
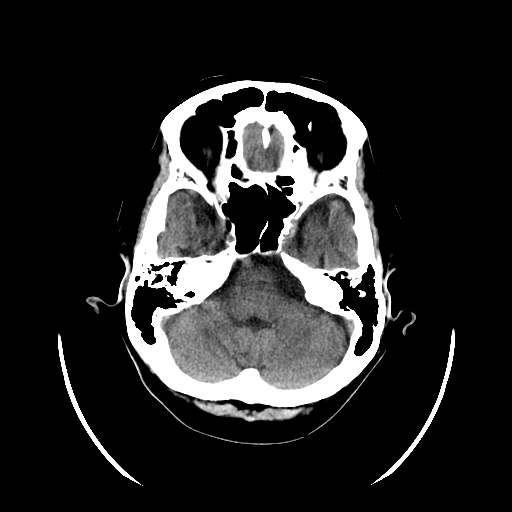
[im 8/28  brain]
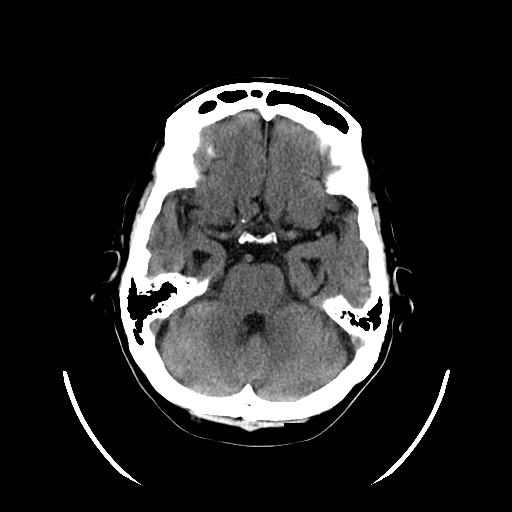
[im 10/28  brain]
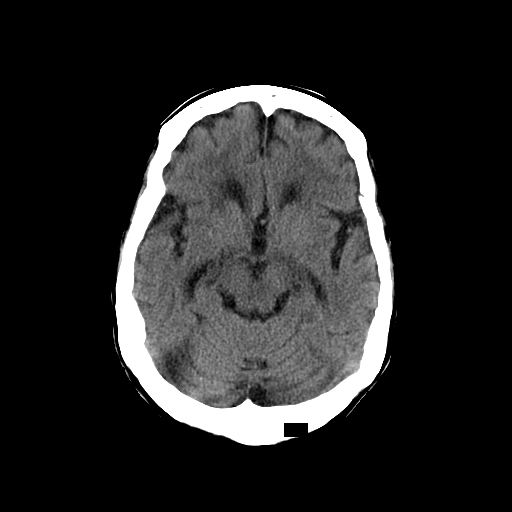
[im 10/28  bone]
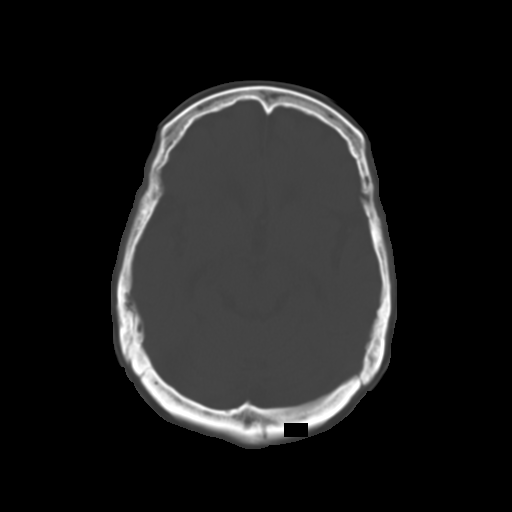
[im 12/28  brain]
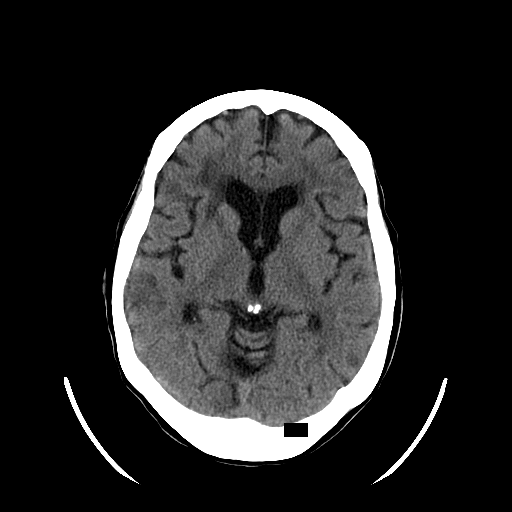
[im 14/28  brain]
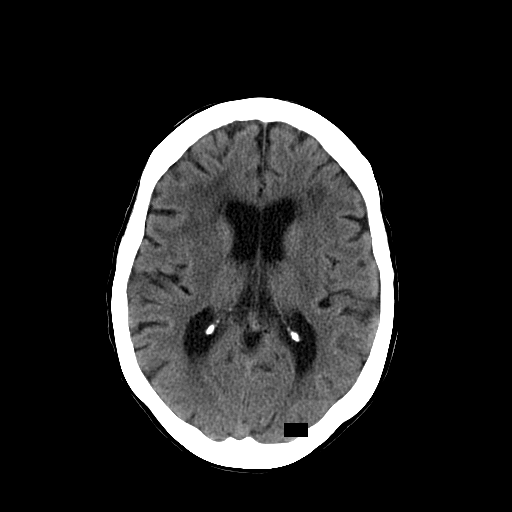
[im 16/28  brain]
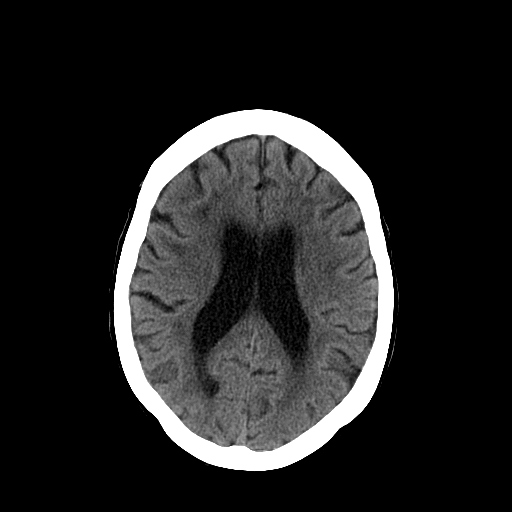
[im 18/28  brain]
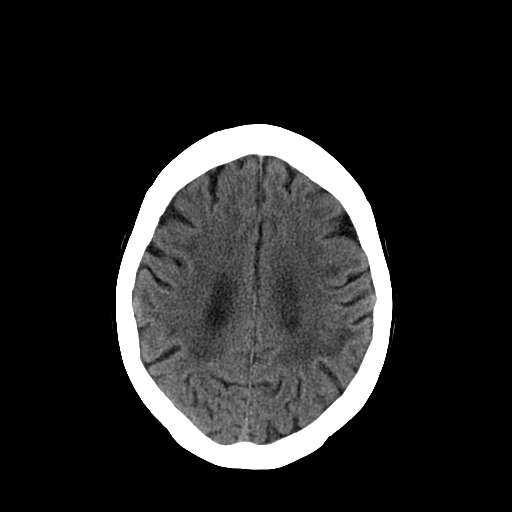
[im 18/28  bone]
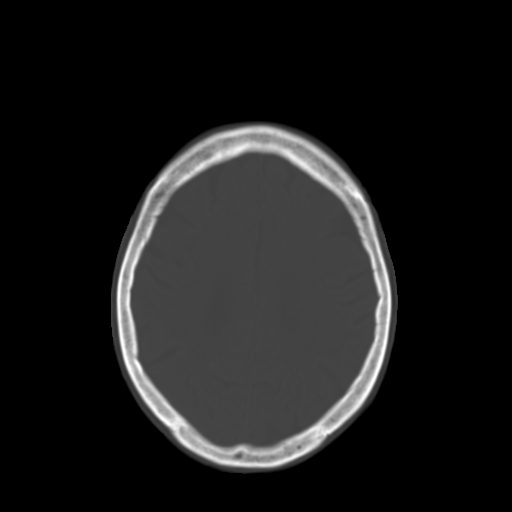
[im 20/28  brain]
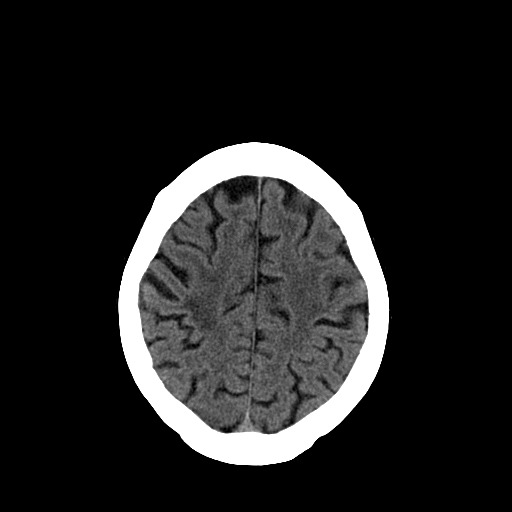
[im 22/28  brain]
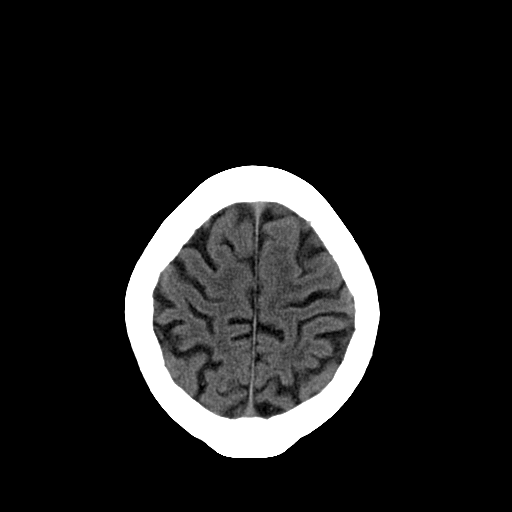
[im 24/28  brain]
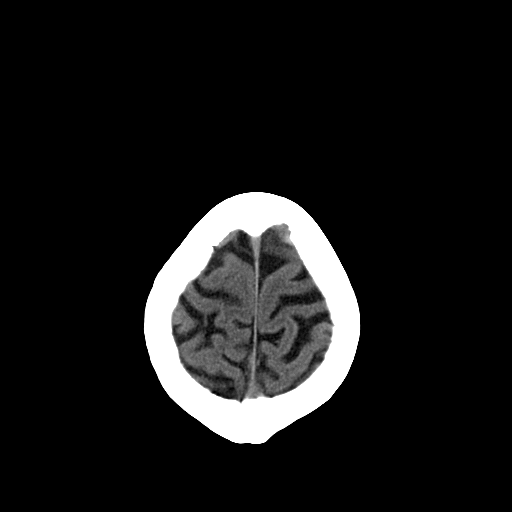
[im 26/28  brain]
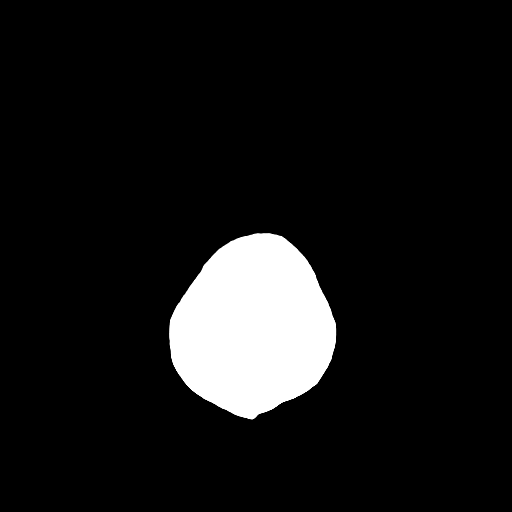
[im 26/28  bone]
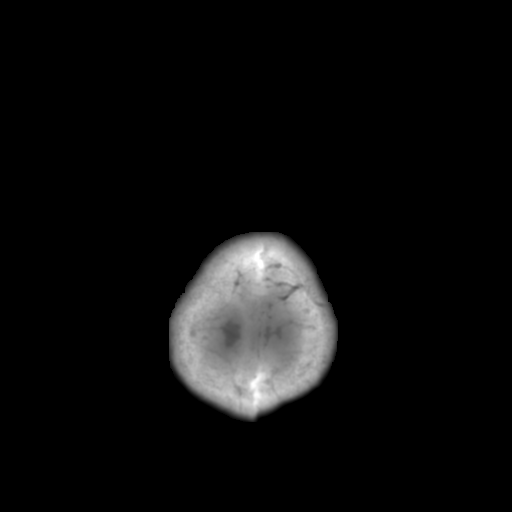

[16 of 30 positions shown; findings below may reference images not displayed]

This examination was performed at [HOSPITAL]. The
interpretation will be provided by [HOSPITAL]

## 2014-08-16 DIAGNOSIS — R1110 Vomiting, unspecified: Secondary | ICD-10-CM | POA: Diagnosis not present

## 2014-08-16 DIAGNOSIS — K921 Melena: Secondary | ICD-10-CM | POA: Diagnosis not present

## 2014-08-21 DIAGNOSIS — K921 Melena: Secondary | ICD-10-CM | POA: Diagnosis not present

## 2014-08-21 DIAGNOSIS — K449 Diaphragmatic hernia without obstruction or gangrene: Secondary | ICD-10-CM | POA: Diagnosis not present

## 2014-09-11 DIAGNOSIS — H6123 Impacted cerumen, bilateral: Secondary | ICD-10-CM | POA: Diagnosis not present

## 2014-10-04 DIAGNOSIS — K449 Diaphragmatic hernia without obstruction or gangrene: Secondary | ICD-10-CM | POA: Diagnosis not present

## 2015-01-31 DIAGNOSIS — I10 Essential (primary) hypertension: Secondary | ICD-10-CM | POA: Diagnosis not present

## 2015-01-31 DIAGNOSIS — M859 Disorder of bone density and structure, unspecified: Secondary | ICD-10-CM | POA: Diagnosis not present

## 2015-01-31 DIAGNOSIS — E538 Deficiency of other specified B group vitamins: Secondary | ICD-10-CM | POA: Diagnosis not present

## 2015-01-31 DIAGNOSIS — D518 Other vitamin B12 deficiency anemias: Secondary | ICD-10-CM | POA: Diagnosis not present

## 2015-01-31 DIAGNOSIS — E785 Hyperlipidemia, unspecified: Secondary | ICD-10-CM | POA: Diagnosis not present

## 2015-01-31 DIAGNOSIS — Z79899 Other long term (current) drug therapy: Secondary | ICD-10-CM | POA: Diagnosis not present

## 2015-01-31 DIAGNOSIS — I2510 Atherosclerotic heart disease of native coronary artery without angina pectoris: Secondary | ICD-10-CM | POA: Diagnosis not present

## 2015-01-31 DIAGNOSIS — E559 Vitamin D deficiency, unspecified: Secondary | ICD-10-CM | POA: Diagnosis not present

## 2015-01-31 DIAGNOSIS — M069 Rheumatoid arthritis, unspecified: Secondary | ICD-10-CM | POA: Diagnosis not present

## 2015-01-31 DIAGNOSIS — R8290 Unspecified abnormal findings in urine: Secondary | ICD-10-CM | POA: Diagnosis not present

## 2015-02-04 DIAGNOSIS — H6123 Impacted cerumen, bilateral: Secondary | ICD-10-CM | POA: Diagnosis not present

## 2015-03-21 DIAGNOSIS — Z23 Encounter for immunization: Secondary | ICD-10-CM | POA: Diagnosis not present

## 2015-04-01 ENCOUNTER — Encounter: Payer: Self-pay | Admitting: Gastroenterology

## 2015-07-03 ENCOUNTER — Other Ambulatory Visit: Payer: Self-pay

## 2015-07-03 DIAGNOSIS — Z1231 Encounter for screening mammogram for malignant neoplasm of breast: Secondary | ICD-10-CM

## 2015-07-31 ENCOUNTER — Ambulatory Visit
Admission: RE | Admit: 2015-07-31 | Discharge: 2015-07-31 | Disposition: A | Discharge status: Home / Self Care | Payer: Medicare Other | Source: Ambulatory Visit

## 2015-07-31 DIAGNOSIS — Z1231 Encounter for screening mammogram for malignant neoplasm of breast: Secondary | ICD-10-CM

## 2015-08-05 DIAGNOSIS — H6123 Impacted cerumen, bilateral: Secondary | ICD-10-CM | POA: Diagnosis not present

## 2015-09-10 DIAGNOSIS — J209 Acute bronchitis, unspecified: Secondary | ICD-10-CM | POA: Diagnosis not present

## 2015-09-10 DIAGNOSIS — R05 Cough: Secondary | ICD-10-CM | POA: Diagnosis not present

## 2015-09-10 DIAGNOSIS — M549 Dorsalgia, unspecified: Secondary | ICD-10-CM | POA: Diagnosis not present

## 2015-09-10 DIAGNOSIS — R5383 Other fatigue: Secondary | ICD-10-CM | POA: Diagnosis not present

## 2015-09-10 DIAGNOSIS — R8290 Unspecified abnormal findings in urine: Secondary | ICD-10-CM | POA: Diagnosis not present

## 2016-02-29 DIAGNOSIS — Z23 Encounter for immunization: Secondary | ICD-10-CM | POA: Diagnosis not present

## 2016-03-04 DIAGNOSIS — H6123 Impacted cerumen, bilateral: Secondary | ICD-10-CM | POA: Diagnosis not present

## 2016-03-13 DIAGNOSIS — R413 Other amnesia: Secondary | ICD-10-CM | POA: Diagnosis not present

## 2016-03-13 DIAGNOSIS — R410 Disorientation, unspecified: Secondary | ICD-10-CM | POA: Diagnosis not present

## 2016-03-13 DIAGNOSIS — N390 Urinary tract infection, site not specified: Secondary | ICD-10-CM | POA: Diagnosis not present

## 2016-03-13 DIAGNOSIS — I2510 Atherosclerotic heart disease of native coronary artery without angina pectoris: Secondary | ICD-10-CM | POA: Diagnosis not present

## 2016-03-13 DIAGNOSIS — I10 Essential (primary) hypertension: Secondary | ICD-10-CM | POA: Diagnosis not present

## 2016-05-15 DIAGNOSIS — Z961 Presence of intraocular lens: Secondary | ICD-10-CM | POA: Diagnosis not present

## 2016-05-15 DIAGNOSIS — H35033 Hypertensive retinopathy, bilateral: Secondary | ICD-10-CM | POA: Diagnosis not present

## 2016-05-15 DIAGNOSIS — H31012 Macula scars of posterior pole (postinflammatory) (post-traumatic), left eye: Secondary | ICD-10-CM | POA: Diagnosis not present

## 2016-05-15 DIAGNOSIS — H43811 Vitreous degeneration, right eye: Secondary | ICD-10-CM | POA: Diagnosis not present

## 2016-06-24 ENCOUNTER — Other Ambulatory Visit: Payer: Self-pay | Admitting: Family Medicine

## 2016-06-24 ENCOUNTER — Other Ambulatory Visit: Payer: Self-pay | Admitting: Family

## 2016-06-24 DIAGNOSIS — Z1231 Encounter for screening mammogram for malignant neoplasm of breast: Secondary | ICD-10-CM

## 2016-08-03 ENCOUNTER — Ambulatory Visit
Admission: RE | Admit: 2016-08-03 | Discharge: 2016-08-03 | Disposition: A | Discharge status: Home / Self Care | Payer: Medicare Other | Source: Ambulatory Visit | Attending: Family | Admitting: Family

## 2016-08-03 DIAGNOSIS — Z1231 Encounter for screening mammogram for malignant neoplasm of breast: Secondary | ICD-10-CM

## 2016-09-30 ENCOUNTER — Ambulatory Visit (INDEPENDENT_AMBULATORY_CARE_PROVIDER_SITE_OTHER): Payer: Medicare Other | Admitting: Podiatry

## 2016-09-30 ENCOUNTER — Encounter: Payer: Self-pay | Admitting: Podiatry

## 2016-09-30 VITALS — BP 128/52 | HR 48 | Resp 16

## 2016-09-30 DIAGNOSIS — B351 Tinea unguium: Secondary | ICD-10-CM | POA: Diagnosis not present

## 2016-09-30 NOTE — Progress Notes (Signed)
   Subjective:    Patient ID: Ashley Willis, female    DOB: Feb 16, 1927, 83 y.o.   MRN: [id number]  HPI This patient presents today stating her toenails are elongated and thickened and is requesting toenail debridement. She denies any direct pain or pressure in the toenails when walking wearing shoes. The nails are gradually thickened over a multiple long period of time and patient denies any professional treatment or self treatment    Review of Systems  All other systems reviewed and are negative.      Objective:   Physical Exam  Orientated 3  Vascular: No peripheral edema bilaterally DP and PT pulses 2/4 bilaterally Capillary reflex immediate bilaterally  Neurological: Sensation to 10 g monofilament wire intact 2/5 bilaterally Vibratory sensation reactive bilaterally Ankle reflexes reactive bilaterally  Dermatological: Open skin lesions bilaterally Atrophic skin. Absent hair growth bilaterally The toenails elongated, brittle, deformed with texture and color changes 6-10  Musculoskeletal: HAV bilaterally Manual motor testing dorsi flexion, plantar flexion, inversion, eversion 5/5 bilaterally      Assessment & Plan:   Assessment: Mild peripheral neuropathy Asymptomatic mycotic toenails 6-10  Plan: Debridement of toenails 6-10 mechanically analytic without any bleeding  Reappoint at patient's request

## 2016-10-06 DIAGNOSIS — H6123 Impacted cerumen, bilateral: Secondary | ICD-10-CM | POA: Diagnosis not present

## 2016-10-08 DIAGNOSIS — M8580 Other specified disorders of bone density and structure, unspecified site: Secondary | ICD-10-CM | POA: Diagnosis not present

## 2016-10-08 DIAGNOSIS — I10 Essential (primary) hypertension: Secondary | ICD-10-CM | POA: Diagnosis not present

## 2016-10-08 DIAGNOSIS — E538 Deficiency of other specified B group vitamins: Secondary | ICD-10-CM | POA: Diagnosis not present

## 2016-10-08 DIAGNOSIS — E559 Vitamin D deficiency, unspecified: Secondary | ICD-10-CM | POA: Diagnosis not present

## 2016-10-08 DIAGNOSIS — F0390 Unspecified dementia without behavioral disturbance: Secondary | ICD-10-CM | POA: Diagnosis not present

## 2016-10-08 DIAGNOSIS — I2510 Atherosclerotic heart disease of native coronary artery without angina pectoris: Secondary | ICD-10-CM | POA: Diagnosis not present

## 2016-10-08 DIAGNOSIS — E785 Hyperlipidemia, unspecified: Secondary | ICD-10-CM | POA: Diagnosis not present

## 2016-11-12 DIAGNOSIS — E2839 Other primary ovarian failure: Secondary | ICD-10-CM | POA: Diagnosis not present

## 2016-11-12 DIAGNOSIS — M8588 Other specified disorders of bone density and structure, other site: Secondary | ICD-10-CM | POA: Diagnosis not present

## 2017-02-18 DIAGNOSIS — Z23 Encounter for immunization: Secondary | ICD-10-CM | POA: Diagnosis not present

## 2017-04-14 DIAGNOSIS — I10 Essential (primary) hypertension: Secondary | ICD-10-CM | POA: Diagnosis not present

## 2017-04-14 DIAGNOSIS — E785 Hyperlipidemia, unspecified: Secondary | ICD-10-CM | POA: Diagnosis not present

## 2017-04-14 DIAGNOSIS — F0390 Unspecified dementia without behavioral disturbance: Secondary | ICD-10-CM | POA: Diagnosis not present

## 2017-04-14 DIAGNOSIS — E559 Vitamin D deficiency, unspecified: Secondary | ICD-10-CM | POA: Diagnosis not present

## 2017-04-14 DIAGNOSIS — K219 Gastro-esophageal reflux disease without esophagitis: Secondary | ICD-10-CM | POA: Diagnosis not present

## 2017-04-14 DIAGNOSIS — M47816 Spondylosis without myelopathy or radiculopathy, lumbar region: Secondary | ICD-10-CM | POA: Diagnosis not present

## 2017-06-24 ENCOUNTER — Other Ambulatory Visit: Payer: Self-pay | Admitting: Family Medicine

## 2017-06-24 DIAGNOSIS — Z1231 Encounter for screening mammogram for malignant neoplasm of breast: Secondary | ICD-10-CM

## 2017-08-04 ENCOUNTER — Ambulatory Visit: Payer: Medicare Other

## 2017-08-10 DIAGNOSIS — H6123 Impacted cerumen, bilateral: Secondary | ICD-10-CM | POA: Diagnosis not present

## 2017-10-08 ENCOUNTER — Ambulatory Visit
Admission: RE | Admit: 2017-10-08 | Discharge: 2017-10-08 | Disposition: A | Discharge status: Home / Self Care | Payer: Medicare Other | Source: Ambulatory Visit | Attending: Family Medicine | Admitting: Family Medicine

## 2017-10-08 DIAGNOSIS — Z1231 Encounter for screening mammogram for malignant neoplasm of breast: Secondary | ICD-10-CM | POA: Diagnosis not present

## 2017-10-12 DIAGNOSIS — K219 Gastro-esophageal reflux disease without esophagitis: Secondary | ICD-10-CM | POA: Diagnosis not present

## 2017-10-12 DIAGNOSIS — M47816 Spondylosis without myelopathy or radiculopathy, lumbar region: Secondary | ICD-10-CM | POA: Diagnosis not present

## 2017-10-12 DIAGNOSIS — F0390 Unspecified dementia without behavioral disturbance: Secondary | ICD-10-CM | POA: Diagnosis not present

## 2017-10-12 DIAGNOSIS — E559 Vitamin D deficiency, unspecified: Secondary | ICD-10-CM | POA: Diagnosis not present

## 2017-10-12 DIAGNOSIS — E785 Hyperlipidemia, unspecified: Secondary | ICD-10-CM | POA: Diagnosis not present

## 2017-10-12 DIAGNOSIS — I10 Essential (primary) hypertension: Secondary | ICD-10-CM | POA: Diagnosis not present

## 2018-02-09 DIAGNOSIS — Z961 Presence of intraocular lens: Secondary | ICD-10-CM | POA: Diagnosis not present

## 2018-02-09 DIAGNOSIS — H35033 Hypertensive retinopathy, bilateral: Secondary | ICD-10-CM | POA: Diagnosis not present

## 2018-02-09 DIAGNOSIS — H43813 Vitreous degeneration, bilateral: Secondary | ICD-10-CM | POA: Diagnosis not present

## 2018-02-09 DIAGNOSIS — H353132 Nonexudative age-related macular degeneration, bilateral, intermediate dry stage: Secondary | ICD-10-CM | POA: Diagnosis not present

## 2018-04-14 DIAGNOSIS — R05 Cough: Secondary | ICD-10-CM | POA: Diagnosis not present

## 2018-04-14 DIAGNOSIS — Z23 Encounter for immunization: Secondary | ICD-10-CM | POA: Diagnosis not present

## 2018-04-14 DIAGNOSIS — F0390 Unspecified dementia without behavioral disturbance: Secondary | ICD-10-CM | POA: Diagnosis not present

## 2018-04-14 DIAGNOSIS — E559 Vitamin D deficiency, unspecified: Secondary | ICD-10-CM | POA: Diagnosis not present

## 2018-04-14 DIAGNOSIS — M47816 Spondylosis without myelopathy or radiculopathy, lumbar region: Secondary | ICD-10-CM | POA: Diagnosis not present

## 2018-04-14 DIAGNOSIS — E785 Hyperlipidemia, unspecified: Secondary | ICD-10-CM | POA: Diagnosis not present

## 2018-04-14 DIAGNOSIS — I10 Essential (primary) hypertension: Secondary | ICD-10-CM | POA: Diagnosis not present

## 2018-07-01 DIAGNOSIS — H6123 Impacted cerumen, bilateral: Secondary | ICD-10-CM | POA: Diagnosis not present

## 2018-07-08 ENCOUNTER — Ambulatory Visit (INDEPENDENT_AMBULATORY_CARE_PROVIDER_SITE_OTHER): Payer: Medicare Other | Admitting: Podiatry

## 2018-07-08 ENCOUNTER — Encounter: Payer: Self-pay | Admitting: Podiatry

## 2018-07-08 DIAGNOSIS — M79674 Pain in right toe(s): Secondary | ICD-10-CM

## 2018-07-08 DIAGNOSIS — M79675 Pain in left toe(s): Secondary | ICD-10-CM | POA: Diagnosis not present

## 2018-07-08 DIAGNOSIS — B351 Tinea unguium: Secondary | ICD-10-CM

## 2018-07-08 NOTE — Progress Notes (Signed)
Complaint:  Visit Type: Patient returns to my office for  preventative foot care services. Complaint: Patient states" my nails have grown long and thick and become painful to walk and wear shoes" Patient has been diagnosed with neuropathy. The patient presents for preventative foot care services. No changes to ROS  Podiatric Exam: Vascular: dorsalis pedis and posterior tibial pulses are minimally  palpable bilateral. Capillary return is immediate. Temperature gradient is WNL. Skin turgor WNL  Sensorium: Diminished  Semmes Weinstein monofilament test. Diminished  tactile sensation bilaterally. Nail Exam: Pt has thick disfigured discolored nails with subungual debris noted bilateral entire nail hallux through fifth toenails Ulcer Exam: There is no evidence of ulcer or pre-ulcerative changes or infection. Orthopedic Exam: Muscle tone and strength are WNL. No limitations in general ROM. No crepitus or effusions noted. Foot type and digits show no abnormalities. HAV  B/L. Skin: No Porokeratosis. No infection or ulcers  Diagnosis:  Onychomycosis, , Pain in right toe, pain in left toes  Treatment & Plan Procedures and Treatment: Consent by patient was obtained for treatment procedures.   Debridement of mycotic and hypertrophic toenails, 1 through 5 bilateral and clearing of subungual debris. No ulceration, no infection noted.  Return Visit-Office Procedure: Patient instructed to return to the office for a follow up visit prn  for continued evaluation and treatment.    Helane Gunther DPM

## 2018-11-11 ENCOUNTER — Ambulatory Visit: Payer: Medicare Other | Admitting: Podiatry

## 2019-01-20 DIAGNOSIS — H6123 Impacted cerumen, bilateral: Secondary | ICD-10-CM | POA: Diagnosis not present

## 2019-02-02 DIAGNOSIS — E785 Hyperlipidemia, unspecified: Secondary | ICD-10-CM | POA: Diagnosis not present

## 2019-02-02 DIAGNOSIS — E559 Vitamin D deficiency, unspecified: Secondary | ICD-10-CM | POA: Diagnosis not present

## 2019-02-02 DIAGNOSIS — F0390 Unspecified dementia without behavioral disturbance: Secondary | ICD-10-CM | POA: Diagnosis not present

## 2019-02-02 DIAGNOSIS — I10 Essential (primary) hypertension: Secondary | ICD-10-CM | POA: Diagnosis not present

## 2019-02-02 DIAGNOSIS — M47816 Spondylosis without myelopathy or radiculopathy, lumbar region: Secondary | ICD-10-CM | POA: Diagnosis not present

## 2019-02-22 DIAGNOSIS — E785 Hyperlipidemia, unspecified: Secondary | ICD-10-CM | POA: Diagnosis not present

## 2019-02-22 DIAGNOSIS — I10 Essential (primary) hypertension: Secondary | ICD-10-CM | POA: Diagnosis not present

## 2019-02-22 DIAGNOSIS — Z23 Encounter for immunization: Secondary | ICD-10-CM | POA: Diagnosis not present

## 2019-04-24 DIAGNOSIS — I252 Old myocardial infarction: Secondary | ICD-10-CM | POA: Diagnosis not present

## 2019-04-24 DIAGNOSIS — I2510 Atherosclerotic heart disease of native coronary artery without angina pectoris: Secondary | ICD-10-CM | POA: Diagnosis not present

## 2019-04-24 DIAGNOSIS — Z8249 Family history of ischemic heart disease and other diseases of the circulatory system: Secondary | ICD-10-CM | POA: Diagnosis not present

## 2019-04-24 DIAGNOSIS — E785 Hyperlipidemia, unspecified: Secondary | ICD-10-CM | POA: Diagnosis not present

## 2019-08-09 DIAGNOSIS — F0390 Unspecified dementia without behavioral disturbance: Secondary | ICD-10-CM | POA: Diagnosis not present

## 2019-08-09 DIAGNOSIS — M47816 Spondylosis without myelopathy or radiculopathy, lumbar region: Secondary | ICD-10-CM | POA: Diagnosis not present

## 2019-08-09 DIAGNOSIS — E559 Vitamin D deficiency, unspecified: Secondary | ICD-10-CM | POA: Diagnosis not present

## 2019-08-09 DIAGNOSIS — I10 Essential (primary) hypertension: Secondary | ICD-10-CM | POA: Diagnosis not present

## 2019-08-09 DIAGNOSIS — E785 Hyperlipidemia, unspecified: Secondary | ICD-10-CM | POA: Diagnosis not present

## 2019-08-25 ENCOUNTER — Ambulatory Visit: Payer: Medicare Other

## 2019-08-26 ENCOUNTER — Ambulatory Visit: Payer: Medicare Other | Attending: Internal Medicine

## 2019-08-26 DIAGNOSIS — Z23 Encounter for immunization: Secondary | ICD-10-CM

## 2019-08-26 NOTE — Progress Notes (Signed)
   Covid-19 Vaccination Clinic  Name:  Ashley Willis    MRN: [id number] DOB: 09/29/1926  08/26/2019  [name] was observed post Covid-19 immunization for 15 minutes without incident. She was provided with Vaccine Information Sheet and instruction to access the V-Safe system.   [name] was instructed to call 911 with any severe reactions post vaccine: Marland Kitchen Difficulty breathing  . Swelling of face and throat  . A fast heartbeat  . A bad rash all over body  . Dizziness and weakness   Immunizations Administered    Name Date Dose VIS Date Route   Pfizer COVID-19 Vaccine 08/26/2019  1:48 PM 0.3 mL 05/26/2019 Intramuscular   Manufacturer: [employer], [employer]   Lot: [id number]   NDC: [address]-5

## 2019-08-28 ENCOUNTER — Ambulatory Visit: Payer: Medicare Other

## 2019-08-30 DIAGNOSIS — F4321 Adjustment disorder with depressed mood: Secondary | ICD-10-CM | POA: Diagnosis not present

## 2019-08-30 DIAGNOSIS — N3000 Acute cystitis without hematuria: Secondary | ICD-10-CM | POA: Diagnosis not present

## 2019-08-30 DIAGNOSIS — R5383 Other fatigue: Secondary | ICD-10-CM | POA: Diagnosis not present

## 2019-09-01 DIAGNOSIS — H6123 Impacted cerumen, bilateral: Secondary | ICD-10-CM | POA: Diagnosis not present

## 2019-09-19 ENCOUNTER — Ambulatory Visit: Payer: Medicare Other | Attending: Internal Medicine

## 2019-09-19 DIAGNOSIS — Z23 Encounter for immunization: Secondary | ICD-10-CM

## 2019-09-19 NOTE — Progress Notes (Signed)
   Covid-19 Vaccination Clinic  Name:  Niccole Witthuhn    MRN: [id number] DOB: 1927-06-08  09/19/2019  Ms. Baumert was observed post Covid-19 immunization for 15 minutes without incident. She was provided with Vaccine Information Sheet and instruction to access the V-Safe system.   Ms. Engebretsen was instructed to call 911 with any severe reactions post vaccine: Marland Kitchen Difficulty breathing  . Swelling of face and throat  . A fast heartbeat  . A bad rash all over body  . Dizziness and weakness   Immunizations Administered    Name Date Dose VIS Date Route   Pfizer COVID-19 Vaccine 09/19/2019  1:16 PM 0.3 mL 05/26/2019 Intramuscular   Manufacturer: [employer], [employer]   Lot: [id number]   NDC: [address]-5

## 2019-09-26 ENCOUNTER — Ambulatory Visit: Payer: Medicare Other | Admitting: Podiatry

## 2019-09-29 ENCOUNTER — Other Ambulatory Visit: Payer: Self-pay

## 2019-09-29 ENCOUNTER — Ambulatory Visit (INDEPENDENT_AMBULATORY_CARE_PROVIDER_SITE_OTHER): Payer: Medicare Other | Admitting: Podiatry

## 2019-09-29 ENCOUNTER — Encounter: Payer: Self-pay | Admitting: Podiatry

## 2019-09-29 VITALS — Temp 96.1°F

## 2019-09-29 DIAGNOSIS — M79675 Pain in left toe(s): Secondary | ICD-10-CM | POA: Diagnosis not present

## 2019-09-29 DIAGNOSIS — B351 Tinea unguium: Secondary | ICD-10-CM

## 2019-09-29 DIAGNOSIS — M79674 Pain in right toe(s): Secondary | ICD-10-CM | POA: Diagnosis not present

## 2019-09-29 NOTE — Progress Notes (Signed)
This patient returns to the office for evaluation and treatment of long thick painful nails .  This patient is unable to trim his own nails since the patient cannot reach the feet.  Patient says the nails are painful walking and wearing his shoes.  He returns for preventive foot care services. Patient has not been seen in over 15 months.  She presents with a female guardian.  General Appearance  Alert, conversant and in no acute stress.  Vascular  Dorsalis pedis and posterior tibial  pulses are palpable  bilaterally.  Capillary return is within normal limits  bilaterally. Temperature is within normal limits  bilaterally.  Neurologic  Senn-Weinstein monofilament wire test diminished   bilaterally. Muscle power within normal limits bilaterally.  Nails Thick disfigured discolored nails with subungual debris  from hallux to fifth toes bilaterally. No evidence of bacterial infection or drainage bilaterally.  Orthopedic  No limitations of motion  feet .  No crepitus or effusions noted.  No bony pathology or digital deformities noted.HAV  B/L.  Skin  normotropic skin with no porokeratosis noted bilaterally.  No signs of infections or ulcers noted.     Onychomycosis  Pain in toes right foot  Pain in toes left foot  Debridement  of nails  1-5  B/L with a nail nipper.  Nails were then filed using a dremel tool with no incidents.    RTC  5 months    Helane Gunther DPM

## 2019-12-28 DIAGNOSIS — H6121 Impacted cerumen, right ear: Secondary | ICD-10-CM | POA: Diagnosis not present

## 2019-12-28 DIAGNOSIS — H9313 Tinnitus, bilateral: Secondary | ICD-10-CM | POA: Diagnosis not present

## 2019-12-28 DIAGNOSIS — H838X3 Other specified diseases of inner ear, bilateral: Secondary | ICD-10-CM | POA: Diagnosis not present

## 2019-12-28 DIAGNOSIS — H903 Sensorineural hearing loss, bilateral: Secondary | ICD-10-CM | POA: Diagnosis not present

## 2020-02-08 DIAGNOSIS — E785 Hyperlipidemia, unspecified: Secondary | ICD-10-CM | POA: Diagnosis not present

## 2020-02-08 DIAGNOSIS — E559 Vitamin D deficiency, unspecified: Secondary | ICD-10-CM | POA: Diagnosis not present

## 2020-02-08 DIAGNOSIS — I10 Essential (primary) hypertension: Secondary | ICD-10-CM | POA: Diagnosis not present

## 2020-02-08 DIAGNOSIS — M47816 Spondylosis without myelopathy or radiculopathy, lumbar region: Secondary | ICD-10-CM | POA: Diagnosis not present

## 2020-02-08 DIAGNOSIS — F0390 Unspecified dementia without behavioral disturbance: Secondary | ICD-10-CM | POA: Diagnosis not present

## 2020-02-21 ENCOUNTER — Other Ambulatory Visit: Payer: Self-pay

## 2020-02-21 ENCOUNTER — Encounter: Payer: Self-pay | Admitting: Podiatry

## 2020-02-21 ENCOUNTER — Ambulatory Visit (INDEPENDENT_AMBULATORY_CARE_PROVIDER_SITE_OTHER): Payer: Medicare Other | Admitting: Podiatry

## 2020-02-21 DIAGNOSIS — M79675 Pain in left toe(s): Secondary | ICD-10-CM

## 2020-02-21 DIAGNOSIS — B351 Tinea unguium: Secondary | ICD-10-CM | POA: Diagnosis not present

## 2020-02-21 DIAGNOSIS — M79674 Pain in right toe(s): Secondary | ICD-10-CM | POA: Diagnosis not present

## 2020-02-21 NOTE — Progress Notes (Signed)
This patient returns to the office for evaluation and treatment of long thick painful nails .  This patient is unable to trim his own nails since the patient cannot reach the feet.  Patient says the nails are painful walking and wearing his shoes.  He returns for preventive foot care services.   She presents with a female guardian.  General Appearance  Alert, conversant and in no acute stress.  Vascular  Dorsalis pedis and posterior tibial  pulses are palpable  bilaterally.  Capillary return is within normal limits  bilaterally. Temperature is within normal limits  bilaterally.  Neurologic  Senn-Weinstein monofilament wire test diminished   bilaterally. Muscle power within normal limits bilaterally.  Nails Thick disfigured discolored nails with subungual debris  from hallux to fifth toes bilaterally. No evidence of bacterial infection or drainage bilaterally.  Orthopedic  No limitations of motion  feet .  No crepitus or effusions noted.  No bony pathology or digital deformities noted.HAV  B/L.  Skin  normotropic skin with no porokeratosis noted bilaterally.  No signs of infections or ulcers noted.     Onychomycosis  Pain in toes right foot  Pain in toes left foot  Debridement  of nails  1-5  B/L with a nail nipper.  Nails were then filed using a dremel tool with no incidents.    RTC  5 months    Romy Ipock DPM  

## 2020-03-01 ENCOUNTER — Ambulatory Visit: Payer: Medicare Other | Admitting: Podiatry

## 2020-03-07 DIAGNOSIS — I10 Essential (primary) hypertension: Secondary | ICD-10-CM | POA: Diagnosis not present

## 2020-03-07 DIAGNOSIS — M7989 Other specified soft tissue disorders: Secondary | ICD-10-CM | POA: Diagnosis not present

## 2020-03-07 DIAGNOSIS — Z23 Encounter for immunization: Secondary | ICD-10-CM | POA: Diagnosis not present

## 2020-03-22 DIAGNOSIS — Z23 Encounter for immunization: Secondary | ICD-10-CM | POA: Diagnosis not present

## 2020-04-10 DIAGNOSIS — H6123 Impacted cerumen, bilateral: Secondary | ICD-10-CM | POA: Diagnosis not present

## 2020-07-26 ENCOUNTER — Ambulatory Visit: Payer: Medicare Other | Admitting: Podiatry

## 2020-07-31 ENCOUNTER — Encounter: Payer: Self-pay | Admitting: Podiatry

## 2020-07-31 ENCOUNTER — Ambulatory Visit (INDEPENDENT_AMBULATORY_CARE_PROVIDER_SITE_OTHER): Payer: Medicare Other | Admitting: Podiatry

## 2020-07-31 ENCOUNTER — Other Ambulatory Visit: Payer: Self-pay

## 2020-07-31 DIAGNOSIS — M79674 Pain in right toe(s): Secondary | ICD-10-CM | POA: Diagnosis not present

## 2020-07-31 DIAGNOSIS — B351 Tinea unguium: Secondary | ICD-10-CM

## 2020-07-31 DIAGNOSIS — M79675 Pain in left toe(s): Secondary | ICD-10-CM | POA: Diagnosis not present

## 2020-07-31 NOTE — Progress Notes (Signed)
This patient returns to the office for evaluation and treatment of long thick painful nails .  This patient is unable to trim his own nails since the patient cannot reach the feet.  Patient says the nails are painful walking and wearing his shoes.  He returns for preventive foot care services.   She presents with a female guardian.  General Appearance  Alert, conversant and in no acute stress.  Vascular  Dorsalis pedis and posterior tibial  pulses are palpable  bilaterally.  Capillary return is within normal limits  bilaterally. Temperature is within normal limits  bilaterally.  Neurologic  Senn-Weinstein monofilament wire test diminished   bilaterally. Muscle power within normal limits bilaterally.  Nails Thick disfigured discolored nails with subungual debris  from hallux to fifth toes bilaterally. No evidence of bacterial infection or drainage bilaterally.  Orthopedic  No limitations of motion  feet .  No crepitus or effusions noted.  No bony pathology or digital deformities noted.HAV  B/L.  Skin  normotropic skin with no porokeratosis noted bilaterally.  No signs of infections or ulcers noted.     Onychomycosis  Pain in toes right foot  Pain in toes left foot  Debridement  of nails  1-5  B/L with a nail nipper.  Nails were then filed using a dremel tool with no incidents.    RTC  5 months    Helane Gunther DPM

## 2020-08-13 DIAGNOSIS — F0390 Unspecified dementia without behavioral disturbance: Secondary | ICD-10-CM | POA: Diagnosis not present

## 2020-08-13 DIAGNOSIS — E785 Hyperlipidemia, unspecified: Secondary | ICD-10-CM | POA: Diagnosis not present

## 2020-08-13 DIAGNOSIS — I2510 Atherosclerotic heart disease of native coronary artery without angina pectoris: Secondary | ICD-10-CM | POA: Diagnosis not present

## 2020-08-13 DIAGNOSIS — Z0000 Encounter for general adult medical examination without abnormal findings: Secondary | ICD-10-CM | POA: Diagnosis not present

## 2020-08-13 DIAGNOSIS — I10 Essential (primary) hypertension: Secondary | ICD-10-CM | POA: Diagnosis not present

## 2020-08-13 DIAGNOSIS — M47816 Spondylosis without myelopathy or radiculopathy, lumbar region: Secondary | ICD-10-CM | POA: Diagnosis not present

## 2020-08-13 DIAGNOSIS — H6120 Impacted cerumen, unspecified ear: Secondary | ICD-10-CM | POA: Diagnosis not present

## 2020-08-13 DIAGNOSIS — E559 Vitamin D deficiency, unspecified: Secondary | ICD-10-CM | POA: Diagnosis not present

## 2020-08-15 DIAGNOSIS — H6123 Impacted cerumen, bilateral: Secondary | ICD-10-CM | POA: Diagnosis not present

## 2020-09-25 ENCOUNTER — Ambulatory Visit
Admission: RE | Admit: 2020-09-25 | Discharge: 2020-09-25 | Disposition: A | Discharge status: Home / Self Care | Payer: Medicare Other | Source: Ambulatory Visit | Attending: Family Medicine | Admitting: Family Medicine

## 2020-09-25 ENCOUNTER — Other Ambulatory Visit: Payer: Self-pay | Admitting: Family Medicine

## 2020-09-25 DIAGNOSIS — M549 Dorsalgia, unspecified: Secondary | ICD-10-CM | POA: Diagnosis not present

## 2020-09-25 DIAGNOSIS — I700 Atherosclerosis of aorta: Secondary | ICD-10-CM | POA: Diagnosis not present

## 2020-09-25 DIAGNOSIS — R0781 Pleurodynia: Secondary | ICD-10-CM | POA: Diagnosis not present

## 2020-12-12 DIAGNOSIS — H6123 Impacted cerumen, bilateral: Secondary | ICD-10-CM | POA: Diagnosis not present

## 2020-12-18 NOTE — Progress Notes (Signed)
Cardiology Office Note   Date:  12/19/2020   ID:  Ashley Willis, DOB [birth year], MRN [id number]  PCP:  Asencion Gowda.August Saucer, MD  Cardiologist:   None Referring:  Self  Chief Complaint  Patient presents with   Coronary Artery Disease      History of Present Illness: Ashley Willis is a 87 y.o. female who is brought in by her daughter for evaluation of a cough.  She has a history of this with an infrior MI and occluded RCA documented in 1989.     She is really not had any problems since that time according to her daughter.  She lives with the son.  Her husband of 72 years died last year.  The family thinks there is been some depression.  She has dementia.  They brought her back today because she is having cough.  This happens in spells.  Her primary care doctor did stop her ARB last August to see if this would help.  Her daughter said her cough changed from a dry cough to more recently a wet cough with some wheezing.  She is not had any weight gain as far as they can tell and there is not been any edema.  She is not having any PND or orthopnea.  She is not having any palpitations, presyncope or syncope.  They have not noticed her complaining of chest pain but she is not really able to participate in a conversation very much.  They said that just over the last couple of days she has had leg weakness and she had some urinary incontinence which is very unusual.  They did say it was foul-smelling urine.  She has not had any fevers or chills.  I see that she had a chest x-ray in April that was unremarkable.   Past Medical History:  Diagnosis Date   Anemia    CAD (coronary artery disease) 07/14/2006   ECHO - EF >55%; borderline concentric LVH; LA mildly dilated, mild tricuspid regurgitation   CAD (coronary artery disease) 08/27/2010   R/P MV - LV normal in size, fixed mid-basal inferior defect is seen, in absence of gated images not possible to distinguish diaphragmatic atenuation from  nontransmural infarction; no significant ischemia detected   Calculus of gallbladder without mention of cholecystitis or obstruction    Diverticulosis of colon (without mention of hemorrhage)    Esophageal reflux    Hiatal hernia    Hyperlipemia    Hypertension    Insomnia, unspecified    Memory loss    Myocardial infarct (HCC) 1989   inferior wall MI - total occlusionof distal RCA beyond PDA branch, treated w/ medical therapy   Osteoporosis, unspecified    Other symptoms involving abdomen and pelvis(789.9)    Pancreatitis    Personal history of colonic polyps 03/03/2010   tubular adenomas   Rheumatoid arthritis(714.0)    Stricture and stenosis of esophagus    Vitamin B12 deficiency     Past Surgical History:  Procedure Laterality Date   ANGIOPLASTY     APPENDECTOMY     AXILLARY SURGERY     BREAST BIOPSY Right 05/2006   CHOLECYSTECTOMY       Current Outpatient Medications  Medication Sig Dispense Refill   aspirin EC 81 MG tablet Take 81 mg by mouth daily. Swallow whole.     atorvastatin (LIPITOR) 40 MG tablet TAKE 1 TABLET (40 MG TOTAL) BY MOUTH DAILY. 90 tablet 3   Calcium Carbonate-Vitamin D 600-400 MG-UNIT tablet  Take 1 tablet by mouth daily.       cholecalciferol (VITAMIN D) 1000 UNITS tablet Take 2,000 Units by mouth daily.     cyanocobalamin 500 MCG tablet Take 1,000 mcg by mouth daily.     diltiazem (CARDIZEM CD) 180 MG 24 hr capsule TAKE 1 CAPSULE (180 MG TOTAL) BY MOUTH DAILY. 90 capsule 3   gabapentin (NEURONTIN) 100 MG capsule Take 200 mg by mouth daily.     No current facility-administered medications for this visit.    Allergies:   Aspirin and Codeine    Social History:  The patient  reports that she has never smoked. She has never used smokeless tobacco. She reports that she does not drink alcohol and does not use drugs.   Family History:  The patient's family history includes Diabetes in her mother.   Noncontributory secondary to advanced age   ROS:   Please see the history of present illness.   Otherwise, review of systems are positive for none.  (Unable to get recent reasonable review of systems given her dementia.)  All other systems are reviewed and negative.    PHYSICAL EXAM: VS:  BP 115/64   Pulse 79   Ht 5' (1.524 m)   Wt 118 lb 9.6 oz (53.8 kg)   SpO2 96%   BMI 23.16 kg/m  , BMI Body mass index is 23.16 kg/m. GEN:  No distress, frail-appearing NECK:  No jugular venous distention at 90 degrees, waveform within normal limits, carotid upstroke brisk and symmetric, no bruits, no thyromegaly LYMPHATICS:  No cervical adenopathy LUNGS:  Clear to auscultation bilaterally BACK:  No CVA tenderness CHEST:  Unremarkable (difficult to assess because of her inability to cooperate with deep breathing HEART:  S1 and S2 within normal limits, no S3, no S4, no clicks, no rubs, no murmurs ABD:  Positive bowel sounds normal in frequency in pitch, no bruits, no rebound, no guarding, unable to assess midline mass or bruit with the patient seated. EXT:  2 plus pulses throughout, moderate edema, no cyanosis no clubbing SKIN:  No rashes no nodules NEURO:  Cranial nerves II through XII grossly intact, motor grossly intact throughout PSYCH: She is somnolent.  She is confused.   EKG:  EKG is ordered today. The ekg ordered today demonstrates sinus rhythm, rate 79, possible old inferior infarct, poor anterior R wave progression, T wave inversions in the anterolateral leads consistent with ischemia   Recent Labs: No results found for requested labs within last 8760 hours.    Lipid Panel    Component Value Date/Time   CHOL 143 07/30/2011 1058   TRIG 95.0 07/30/2011 1058   HDL 54.80 07/30/2011 1058   CHOLHDL 3 07/30/2011 1058   VLDL 19.0 07/30/2011 1058   LDLCALC 69 07/30/2011 1058   LDLDIRECT 131.1 10/16/2008 1644      Wt Readings from Last 3 Encounters:  12/19/20 118 lb 9.6 oz (53.8 kg)  01/25/14 117 lb 8 oz (53.3 kg)  02/16/13 115 lb 8  oz (52.4 kg)      Other studies Reviewed: Additional studies/ records that were reviewed today include: Labs. Review of the above records demonstrates:  Please see elsewhere in the note.     ASSESSMENT AND PLAN:  CAD: At this point I do not think she is having any anginal equivalent.  Her cough could be related to heart failure and I am going to check a BNP and an echocardiogram.  If these are normal I might suggest pulmonary  evaluation.    COUGH: This will be evaluated as above.  I will repeat PA and lateral chest x-rays.  She also has shortness of breath.  WEAKNESS: I am concerned that she could be having an active urinary infection.  I will be checking CBC, TSH and basic metabolic profile.  I will try to get a UA although I think this will be difficult.  I do have a phone call into her primary provider to see if he wants to presumptively treat a possible urinary infection.   Current medicines are reviewed at length with the patient today.  The patient does not have concerns regarding medicines.  The following changes have been made:  no change  Labs/ tests ordered today include:   Orders Placed This Encounter  Procedures   DG Chest 2 View   TSH   CBC with Differential/Platelet   B Nat Peptide   Basic metabolic panel   Urinalysis   ECHOCARDIOGRAM COMPLETE      Disposition:   FU with me based on the results of the above.     Signed, Rollene Rotunda, MD  12/19/2020 3:37 PM    [facility]

## 2020-12-19 ENCOUNTER — Other Ambulatory Visit: Payer: Self-pay

## 2020-12-19 ENCOUNTER — Encounter: Payer: Self-pay | Admitting: Cardiology

## 2020-12-19 ENCOUNTER — Ambulatory Visit (INDEPENDENT_AMBULATORY_CARE_PROVIDER_SITE_OTHER): Payer: Medicare Other | Admitting: Cardiology

## 2020-12-19 VITALS — BP 115/64 | HR 79 | Ht 60.0 in | Wt 118.6 lb

## 2020-12-19 DIAGNOSIS — R059 Cough, unspecified: Secondary | ICD-10-CM

## 2020-12-19 DIAGNOSIS — R531 Weakness: Secondary | ICD-10-CM

## 2020-12-19 DIAGNOSIS — R0602 Shortness of breath: Secondary | ICD-10-CM

## 2020-12-19 DIAGNOSIS — Z9861 Coronary angioplasty status: Secondary | ICD-10-CM

## 2020-12-19 DIAGNOSIS — I2510 Atherosclerotic heart disease of native coronary artery without angina pectoris: Secondary | ICD-10-CM | POA: Diagnosis not present

## 2020-12-19 NOTE — Patient Instructions (Addendum)
Medication Instructions:  Your physician recommends that you continue on your current medications as directed. Please refer to the Current Medication list given to you today.   *If you need a refill on your cardiac medications before your next appointment, please call your pharmacy*  Lab Work: URINALYSIS/TSH/BNT/BMET/CBC TODAY   If you have labs (blood work) drawn today and your tests are completely normal, you will receive your results only by: MyChart Message (if you have MyChart) OR A paper copy in the mail If you have any lab test that is abnormal or we need to change your treatment, we will call you to review the results.  Testing/Procedures: A chest x-ray takes a picture of the organs and structures inside the chest, including the heart, lungs, and blood vessels. This test can show several things, including, whether the heart is enlarges; whether fluid is building up in the lungs; and whether pacemaker / defibrillator leads are still in place.  Your physician has requested that you have an echocardiogram. Echocardiography is a painless test that uses sound waves to create images of your heart. It provides your doctor with information about the size and shape of your heart and how well your heart's chambers and valves are working. This procedure takes approximately one hour. There are no restrictions for this procedure.  [facility] AT [address]   Follow-Up: AS NEEDED

## 2020-12-20 DIAGNOSIS — R531 Weakness: Secondary | ICD-10-CM | POA: Diagnosis not present

## 2020-12-20 LAB — CBC WITH DIFFERENTIAL/PLATELET
Basophils Absolute: 0.1 10*3/uL (ref 0.0–0.2)
Basos: 1 %
EOS (ABSOLUTE): 0.1 10*3/uL (ref 0.0–0.4)
Eos: 1 %
Hematocrit: 40 % (ref 34.0–46.6)
Hemoglobin: 13.3 g/dL (ref 11.1–15.9)
Immature Grans (Abs): 0 10*3/uL (ref 0.0–0.1)
Immature Granulocytes: 0 %
Lymphocytes Absolute: 2.6 10*3/uL (ref 0.7–3.1)
Lymphs: 34 %
MCH: 27.9 pg (ref 26.6–33.0)
MCHC: 33.3 g/dL (ref 31.5–35.7)
MCV: 84 fL (ref 79–97)
Monocytes Absolute: 0.8 10*3/uL (ref 0.1–0.9)
Monocytes: 11 %
Neutrophils Absolute: 4.1 10*3/uL (ref 1.4–7.0)
Neutrophils: 53 %
Platelets: 163 10*3/uL (ref 150–450)
RBC: 4.76 x10E6/uL (ref 3.77–5.28)
RDW: 12.2 % (ref 11.7–15.4)
WBC: 7.7 10*3/uL (ref 3.4–10.8)

## 2020-12-20 LAB — BRAIN NATRIURETIC PEPTIDE: BNP: 23.7 pg/mL (ref 0.0–100.0)

## 2020-12-20 LAB — BASIC METABOLIC PANEL
BUN/Creatinine Ratio: 15 (ref 12–28)
BUN: 15 mg/dL (ref 10–36)
CO2: 23 mmol/L (ref 20–29)
Calcium: 9.3 mg/dL (ref 8.7–10.3)
Chloride: 94 mmol/L — ABNORMAL LOW (ref 96–106)
Creatinine, Ser: 0.99 mg/dL (ref 0.57–1.00)
Glucose: 136 mg/dL — ABNORMAL HIGH (ref 65–99)
Potassium: 3.9 mmol/L (ref 3.5–5.2)
Sodium: 134 mmol/L (ref 134–144)
eGFR: 53 mL/min/{1.73_m2} — ABNORMAL LOW (ref >59)

## 2020-12-20 LAB — TSH: TSH: 2.58 u[IU]/mL (ref 0.450–4.500)

## 2020-12-21 LAB — URINALYSIS
Bilirubin, UA: NEGATIVE
Glucose, UA: NEGATIVE
Nitrite, UA: NEGATIVE
RBC, UA: NEGATIVE
Specific Gravity, UA: 1.024 (ref 1.005–1.030)
Urobilinogen, Ur: 0.2 mg/dL (ref 0.2–1.0)
pH, UA: 5.5 (ref 5.0–7.5)

## 2020-12-26 ENCOUNTER — Telehealth (HOSPITAL_COMMUNITY): Payer: Self-pay | Admitting: Cardiology

## 2020-12-26 NOTE — Telephone Encounter (Signed)
Patients daughter called and cancelled echocardiogram and did not wish to reschedule at this time. Order will be removed from the ECHO WQ and when patient calls back to rescheduled we will reinstate the order.

## 2021-01-07 ENCOUNTER — Ambulatory Visit: Payer: Medicare Other | Admitting: Podiatry

## 2021-01-08 ENCOUNTER — Other Ambulatory Visit (HOSPITAL_COMMUNITY): Payer: Medicare Other

## 2021-01-15 ENCOUNTER — Ambulatory Visit: Payer: Medicare Other | Admitting: Cardiovascular Disease

## 2021-01-28 ENCOUNTER — Other Ambulatory Visit: Payer: Self-pay

## 2021-01-28 ENCOUNTER — Encounter: Payer: Self-pay | Admitting: Podiatry

## 2021-01-28 ENCOUNTER — Ambulatory Visit (INDEPENDENT_AMBULATORY_CARE_PROVIDER_SITE_OTHER): Payer: Medicare Other | Admitting: Podiatry

## 2021-01-28 DIAGNOSIS — B351 Tinea unguium: Secondary | ICD-10-CM

## 2021-01-28 DIAGNOSIS — M79674 Pain in right toe(s): Secondary | ICD-10-CM

## 2021-01-28 DIAGNOSIS — M79675 Pain in left toe(s): Secondary | ICD-10-CM | POA: Diagnosis not present

## 2021-01-28 NOTE — Progress Notes (Signed)
This patient returns to the office for evaluation and treatment of long thick painful nails .  This patient is unable to trim his own nails since the patient cannot reach the feet.  Patient says the nails are painful walking and wearing his shoes.  He returns for preventive foot care services.   She presents with a female guardian.  General Appearance  Alert, conversant and in no acute stress.  Vascular  Dorsalis pedis and posterior tibial  pulses are palpable  bilaterally.  Capillary return is within normal limits  bilaterally. Temperature is within normal limits  bilaterally.  Neurologic  Senn-Weinstein monofilament wire test diminished   bilaterally. Muscle power within normal limits bilaterally.  Nails Thick disfigured discolored nails with subungual debris  from hallux to fifth toes bilaterally. No evidence of bacterial infection or drainage bilaterally.  Orthopedic  No limitations of motion  feet .  No crepitus or effusions noted.  No bony pathology or digital deformities noted.  HAV  B/L.  Skin  normotropic skin with no porokeratosis noted bilaterally.  No signs of infections or ulcers noted.     Onychomycosis  Pain in toes right foot  Pain in toes left foot  Debridement  of nails  1-5  B/L with a nail nipper.  Nails were then filed using a dremel tool with no incidents.    RTC  6  months    Helane Gunther DPM

## 2021-01-31 DIAGNOSIS — Z23 Encounter for immunization: Secondary | ICD-10-CM | POA: Diagnosis not present

## 2021-02-04 ENCOUNTER — Ambulatory Visit (HOSPITAL_COMMUNITY): Payer: Medicare Other | Attending: Cardiology

## 2021-02-04 ENCOUNTER — Ambulatory Visit
Admission: RE | Admit: 2021-02-04 | Discharge: 2021-02-04 | Disposition: A | Discharge status: Home / Self Care | Payer: Medicare Other | Source: Ambulatory Visit | Attending: Cardiology | Admitting: Cardiology

## 2021-02-04 ENCOUNTER — Other Ambulatory Visit: Payer: Self-pay

## 2021-02-04 DIAGNOSIS — R531 Weakness: Secondary | ICD-10-CM

## 2021-02-04 DIAGNOSIS — I2510 Atherosclerotic heart disease of native coronary artery without angina pectoris: Secondary | ICD-10-CM | POA: Insufficient documentation

## 2021-02-04 DIAGNOSIS — R059 Cough, unspecified: Secondary | ICD-10-CM

## 2021-02-04 DIAGNOSIS — Z9861 Coronary angioplasty status: Secondary | ICD-10-CM | POA: Insufficient documentation

## 2021-02-04 LAB — ECHOCARDIOGRAM COMPLETE
Area-P 1/2: 1.72 cm2
P 1/2 time: 543 msec
S' Lateral: 2.8 cm

## 2021-02-11 ENCOUNTER — Ambulatory Visit: Payer: Medicare Other | Admitting: Cardiology

## 2021-02-18 DIAGNOSIS — E785 Hyperlipidemia, unspecified: Secondary | ICD-10-CM | POA: Diagnosis not present

## 2021-02-18 DIAGNOSIS — Z23 Encounter for immunization: Secondary | ICD-10-CM | POA: Diagnosis not present

## 2021-02-18 DIAGNOSIS — M47816 Spondylosis without myelopathy or radiculopathy, lumbar region: Secondary | ICD-10-CM | POA: Diagnosis not present

## 2021-02-18 DIAGNOSIS — R059 Cough, unspecified: Secondary | ICD-10-CM | POA: Diagnosis not present

## 2021-02-18 DIAGNOSIS — E559 Vitamin D deficiency, unspecified: Secondary | ICD-10-CM | POA: Diagnosis not present

## 2021-02-18 DIAGNOSIS — I10 Essential (primary) hypertension: Secondary | ICD-10-CM | POA: Diagnosis not present

## 2021-02-18 DIAGNOSIS — F0390 Unspecified dementia without behavioral disturbance: Secondary | ICD-10-CM | POA: Diagnosis not present

## 2021-03-04 ENCOUNTER — Encounter: Payer: Self-pay | Admitting: *Deleted

## 2021-06-05 DIAGNOSIS — H6123 Impacted cerumen, bilateral: Secondary | ICD-10-CM | POA: Diagnosis not present

## 2021-07-02 ENCOUNTER — Other Ambulatory Visit: Payer: Self-pay

## 2021-07-02 ENCOUNTER — Encounter: Payer: Self-pay | Admitting: Podiatry

## 2021-07-02 ENCOUNTER — Ambulatory Visit (INDEPENDENT_AMBULATORY_CARE_PROVIDER_SITE_OTHER): Payer: Medicare Other | Admitting: Podiatry

## 2021-07-02 DIAGNOSIS — M79675 Pain in left toe(s): Secondary | ICD-10-CM | POA: Diagnosis not present

## 2021-07-02 DIAGNOSIS — B351 Tinea unguium: Secondary | ICD-10-CM

## 2021-07-02 DIAGNOSIS — M79674 Pain in right toe(s): Secondary | ICD-10-CM

## 2021-07-02 NOTE — Progress Notes (Signed)
This patient returns to the office for evaluation and treatment of long thick painful nails .  This patient is unable to trim his own nails since the patient cannot reach the feet.  Patient says the nails are painful walking and wearing his shoes.  He returns for preventive foot care services.   She presents with a female guardian.  General Appearance  Alert, conversant and in no acute stress.  Vascular  Dorsalis pedis and posterior tibial  pulses are palpable  bilaterally.  Capillary return is within normal limits  bilaterally. Temperature is within normal limits  bilaterally.  Neurologic  Senn-Weinstein monofilament wire test diminished   bilaterally. Muscle power within normal limits bilaterally.  Nails Thick disfigured discolored nails with subungual debris  from hallux to fifth toes bilaterally. No evidence of bacterial infection or drainage bilaterally.  Orthopedic  No limitations of motion  feet .  No crepitus or effusions noted.  No bony pathology or digital deformities noted.  HAV  B/L.  Skin  normotropic skin with no porokeratosis noted bilaterally.  No signs of infections or ulcers noted.     Onychomycosis  Pain in toes right foot  Pain in toes left foot  Debridement  of nails  1-5  B/L with a nail nipper.  Nails were then filed using a dremel tool with no incidents.    RTC  4   months    Gardiner Barefoot DPM

## 2021-08-08 ENCOUNTER — Ambulatory Visit
Admission: RE | Admit: 2021-08-08 | Discharge: 2021-08-08 | Disposition: A | Discharge status: Home / Self Care | Payer: Medicare Other | Source: Ambulatory Visit | Attending: Internal Medicine | Admitting: Internal Medicine

## 2021-08-08 ENCOUNTER — Other Ambulatory Visit: Payer: Self-pay | Admitting: Internal Medicine

## 2021-08-08 DIAGNOSIS — R053 Chronic cough: Secondary | ICD-10-CM

## 2021-08-20 DIAGNOSIS — E559 Vitamin D deficiency, unspecified: Secondary | ICD-10-CM | POA: Diagnosis not present

## 2021-08-20 DIAGNOSIS — R059 Cough, unspecified: Secondary | ICD-10-CM | POA: Diagnosis not present

## 2021-08-20 DIAGNOSIS — E785 Hyperlipidemia, unspecified: Secondary | ICD-10-CM | POA: Diagnosis not present

## 2021-08-20 DIAGNOSIS — Z0000 Encounter for general adult medical examination without abnormal findings: Secondary | ICD-10-CM | POA: Diagnosis not present

## 2021-08-20 DIAGNOSIS — M47816 Spondylosis without myelopathy or radiculopathy, lumbar region: Secondary | ICD-10-CM | POA: Diagnosis not present

## 2021-08-20 DIAGNOSIS — F0390 Unspecified dementia without behavioral disturbance: Secondary | ICD-10-CM | POA: Diagnosis not present

## 2021-08-20 DIAGNOSIS — I10 Essential (primary) hypertension: Secondary | ICD-10-CM | POA: Diagnosis not present

## 2021-09-12 DIAGNOSIS — M79672 Pain in left foot: Secondary | ICD-10-CM | POA: Diagnosis not present

## 2021-10-31 ENCOUNTER — Encounter: Payer: Self-pay | Admitting: Student

## 2021-10-31 ENCOUNTER — Ambulatory Visit (INDEPENDENT_AMBULATORY_CARE_PROVIDER_SITE_OTHER): Payer: Medicare Other

## 2021-10-31 ENCOUNTER — Ambulatory Visit (INDEPENDENT_AMBULATORY_CARE_PROVIDER_SITE_OTHER): Payer: Medicare Other | Admitting: Student

## 2021-10-31 ENCOUNTER — Telehealth: Payer: Self-pay | Admitting: Student

## 2021-10-31 VITALS — BP 122/64 | HR 71 | Temp 98.0°F | Ht 62.0 in | Wt 104.4 lb

## 2021-10-31 DIAGNOSIS — R053 Chronic cough: Secondary | ICD-10-CM

## 2021-10-31 MED ORDER — FLUTICASONE PROPIONATE 50 MCG/ACT NA SUSP: 1.0000 | Freq: Every day | NASAL | 6 refills | Status: DC

## 2021-10-31 MED ORDER — DEXTROMETHORPHAN HBR 15 MG/5ML PO SYRP: 10.0000 mL | ORAL_SOLUTION | Freq: Four times a day (QID) | ORAL | 3 refills | Status: DC | PRN

## 2021-10-31 NOTE — Telephone Encounter (Signed)
Already picked up delsym 12h which is fine

## 2021-10-31 NOTE — Telephone Encounter (Signed)
Please advise 

## 2021-10-31 NOTE — Progress Notes (Signed)
Synopsis: Referred for chronic cough by Alroy Dust, L.Marlou Sa, MD  Subjective:   PATIENT ID: Ashley Willis GENDER: female DOB: 1927/05/17, MRN: [id number]  Chief Complaint  Patient presents with   Pulmonary Consult    Referred by Dr. Alroy Dust. Pt c/o cough for the past 3 years. Cough is occ prod with clear sputum. Cough seems worse at night. She gets winded walking room to room.    63yF with history of CAD, GERD, hiatal hernia, OP, RA, stricture/stenosis of esophagus referred for chronic cough. Had covid-19.   She has very severe cough per daughter. Coughs frequently at night, during day. Dry usually but occasionally coughs up thick clear mucus. No DOE. Daughter also occasionally notes some wheezing during coughing spell. No trouble swallowing/dysphagia to solids or liquids, heartburn/reflux, sinus congestion/postnasal drainage (daughter disagrees and thinks nose is running a bit more).   She hasn't tried any inhalers.   Tessalon perles weren't helpful. Trial of ppi - gets it in middle of day before she tries to eat. They don't think she has received steroid course.   Otherwise pertinent review of systems is negative.  No family history of lung disease  She worked for [employer] for a long time. Made styrofoam plates in past. Never smoker, MJ, vaping.     Past Medical History:  Diagnosis Date   Anemia    CAD (coronary artery disease) 07/14/2006   ECHO - EF >55%; borderline concentric LVH; LA mildly dilated, mild tricuspid regurgitation   CAD (coronary artery disease) 08/27/2010   R/P MV - LV normal in size, fixed mid-basal inferior defect is seen, in absence of gated images not possible to distinguish diaphragmatic atenuation from nontransmural infarction; no significant ischemia detected   Calculus of gallbladder without mention of cholecystitis or obstruction    Diverticulosis of colon (without mention of hemorrhage)    Esophageal reflux    Hiatal hernia    Hyperlipemia     Hypertension    Insomnia, unspecified    Memory loss    Myocardial infarct ([place]) 1989   inferior wall MI - total occlusionof distal RCA beyond PDA branch, treated w/ medical therapy   Osteoporosis, unspecified    Other symptoms involving abdomen and pelvis(789.9)    Pancreatitis    Personal history of colonic polyps 03/03/2010   tubular adenomas   Rheumatoid arthritis(714.0)    Stricture and stenosis of esophagus    Vitamin B12 deficiency      Family History  Problem Relation Age of Onset   Diabetes Mother    Colon cancer Neg Hx      Past Surgical History:  Procedure Laterality Date   ANGIOPLASTY     APPENDECTOMY     AXILLARY SURGERY     BREAST BIOPSY Right 05/2006   CHOLECYSTECTOMY      Social History   Socioeconomic History   Marital status: Widowed    Spouse name: Not on file   Number of children: 4   Years of education: Not on file   Highest education level: Not on file  Occupational History   Occupation: Retired  Tobacco Use   Smoking status: Never   Smokeless tobacco: Never  Vaping Use   Vaping Use: Never used  Substance and Sexual Activity   Alcohol use: No   Drug use: No   Sexual activity: Not Currently  Other Topics Concern   Not on file  Social History Narrative   Married 1948. 1 daughter 39; 3 sons [place]. 9 grandchildrens;  about great-grandchildren. 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 Swallow whole.     atorvastatin (LIPITOR) 40 MG tablet TAKE 1 TABLET (40 MG TOTAL) BY MOUTH DAILY. 90  tablet 3   Calcium Carbonate-Vitamin D 600-400 MG-UNIT tablet Take 1 tablet by mouth daily.       cholecalciferol (VITAMIN D) 1000 UNITS tablet Take 2,000 Units by mouth daily.     gabapentin (NEURONTIN) 100 MG capsule Take 100 mg by mouth daily.     pantoprazole (PROTONIX) 40 MG tablet Take 40 mg by mouth daily.     cyanocobalamin 500 MCG tablet Take 1,000 mcg by mouth daily.     diltiazem (CARDIZEM CD) 180 MG 24 hr capsule TAKE 1 CAPSULE (180 MG TOTAL) BY MOUTH DAILY. 90 capsule 3   No facility-administered medications prior to visit.       Objective:   Physical Exam:  General appearance: 88 y.o., female, NAD, conversant  Eyes: anicteric sclerae; PERRL, tracking appropriately HENT: NCAT; MMM Neck: Trachea midline; no lymphadenopathy, no JVD Lungs: CTAB, no crackles, no wheeze, with normal respiratory effort CV: RRR, no murmur  Abdomen: Soft, non-tender; non-distended, BS present  Extremities: No peripheral edema, warm Skin: Normal turgor and texture; no rash Psych: Appropriate affect Neuro: Alert and oriented to person and place, no focal deficit     Vitals:   10/31/21 1356  BP: 122/64  Pulse: 71  Temp: 98 F (36.7 C)  TempSrc: Oral  SpO2: 94%  Weight: 104 lb 6.4 oz (47.4 kg)  Height: 5\' 2"  (1.575 m)   94% on RA BMI Readings from Last 3 Encounters:  10/31/21 19.10 kg/m  12/19/20 23.16 kg/m  01/25/14 22.20 kg/m   Wt Readings from Last 3 Encounters:  10/31/21 104 lb 6.4 oz (47.4 kg)  12/19/20 118 lb 9.6 oz (53.8 kg)  01/25/14 117 lb 8 oz (53.3 kg)     CBC    Component Value Date/Time   WBC 7.7 12/19/2020 1604   WBC 5.0 02/16/2013 0932   WBC 7.6 07/30/2011 1058   RBC 4.76 12/19/2020 1604   RBC 4.45 02/16/2013 0932   RBC 4.67 07/30/2011 1058   HGB 13.3 12/19/2020 1604   HGB 12.5 02/16/2013 0932   HCT 40.0 12/19/2020 1604   HCT 38.6 02/16/2013 0932   PLT 163 12/19/2020 1604   MCV 84 12/19/2020 1604   MCV 86.8 02/16/2013 0932   MCH 27.9  12/19/2020 1604   MCH 28.1 02/16/2013 0932   MCHC 33.3 12/19/2020 1604   MCHC 32.4 02/16/2013 0932   MCHC 32.3 07/30/2011 1058   RDW 12.2 12/19/2020 1604   RDW 13.8 02/16/2013 0932   LYMPHSABS 2.6 12/19/2020 1604   LYMPHSABS 2.4 02/16/2013 0932   MONOABS 0.6 02/16/2013 0932   EOSABS 0.1 12/19/2020 1604   BASOSABS 0.1 12/19/2020 1604   BASOSABS 0.0 02/16/2013 0932      Chest Imaging: CXR 08/08/21 reviewed by me with prominent basilar interstitial markings  Pulmonary Functions Testing Results:  View : No data to display.           Echocardiogram:   TTE 02/04/21:  1. Left ventricular ejection fraction, by estimation, is 55 to 60%. The  left ventricle has normal function. The left ventricle has no regional  wall motion abnormalities. There is mild concentric left ventricular  hypertrophy. Left ventricular diastolic  parameters are indeterminate.   2. Right ventricular systolic function is normal. The right ventricular  size is normal. Tricuspid regurgitation signal is inadequate for assessing  PA pressure.   3. Left atrial size was mildly dilated.   4. The mitral valve is normal in structure. Trivial mitral valve  regurgitation. No evidence of mitral stenosis.   5. The aortic valve is tricuspid. There is mild calcification of the  aortic valve. Aortic valve regurgitation is trivial. No aortic stenosis is  present.   6. The inferior vena cava is normal in size with greater than 50%  respiratory variability, suggesting right atrial pressure of 3 mmHg.       Assessment & Plan:   # Chronic cough Possible component of rhinitis/postnasal drainage, but given history of esophageal stricture and dementia concern for recurrent aspiration even if silent. Didn't have robust response to honest trial of ppi. Her CXR raises possible of lower lobe fibrotic changes which in all likelihood is related to recurrent aspiration but I would favor a conservative approach to workup for this  older patient with dementia - extent at least looks stable to me in comparison to February.  Plan: - would try taking dextromethorphan syrup as needed for cough suppressant - flonase 2 sprays each nostril daily for component of postnasal drainage  - claritin 10 mg daily if still has runny nose/cough despite use of flonase, dextromethorphan - consider MBS/speech eval - they defer this today - see you in 8 weeks or sooner if need be!     Maryjane Hurter, MD [facility] 10/31/2021 5:13 PM

## 2021-10-31 NOTE — Patient Instructions (Addendum)
-   would try taking dextromethorphan syrup as needed for cough suppressant - flonase 2 sprays each nostril daily for component of postnasal drainage  - claritin 10 mg daily if still has runny nose/cough despite use of flonase, dextromethorphan - x ray today - see you in 8 weeks or sooner if need be!

## 2021-11-04 ENCOUNTER — Telehealth: Payer: Self-pay | Admitting: Student

## 2021-11-04 NOTE — Telephone Encounter (Signed)
Called patients daughter and she states she would like the results of the chest xray that was done on Friday.  And she also wants to know if you could keep her mother on the cough syrup until she sees you next follow up   Please advise Dr Thora Lance

## 2021-11-05 ENCOUNTER — Ambulatory Visit (INDEPENDENT_AMBULATORY_CARE_PROVIDER_SITE_OTHER): Payer: Medicare Other | Admitting: Podiatry

## 2021-11-05 ENCOUNTER — Encounter: Payer: Self-pay | Admitting: Podiatry

## 2021-11-05 DIAGNOSIS — M79674 Pain in right toe(s): Secondary | ICD-10-CM

## 2021-11-05 DIAGNOSIS — M79675 Pain in left toe(s): Secondary | ICD-10-CM

## 2021-11-05 DIAGNOSIS — B351 Tinea unguium: Secondary | ICD-10-CM

## 2021-11-05 NOTE — Progress Notes (Signed)
This patient returns to the office for evaluation and treatment of long thick painful nails .  This patient is unable to trim his own nails since the patient cannot reach the feet.  Patient says the nails are painful walking and wearing his shoes.  He returns for preventive foot care services.   She presents with a female guardian. ° °General Appearance  Alert, conversant and in no acute stress. ° °Vascular  Dorsalis pedis and posterior tibial  pulses are palpable  bilaterally.  Capillary return is within normal limits  bilaterally. Temperature is within normal limits  bilaterally. ° °Neurologic  Senn-Weinstein monofilament wire test diminished   bilaterally. Muscle power within normal limits bilaterally. ° °Nails Thick disfigured discolored nails with subungual debris  from hallux to fifth toes bilaterally. No evidence of bacterial infection or drainage bilaterally. ° °Orthopedic  No limitations of motion  feet .  No crepitus or effusions noted.  No bony pathology or digital deformities noted.  HAV  B/L. ° °Skin  normotropic skin with no porokeratosis noted bilaterally.  No signs of infections or ulcers noted.    ° °Onychomycosis  Pain in toes right foot  Pain in toes left foot ° °Debridement  of nails  1-5  B/L with a nail nipper.  Nails were then filed using a dremel tool with no incidents.    RTC  4   months  ° ° °Sawsan Riggio DPM  °

## 2021-12-01 DIAGNOSIS — H6123 Impacted cerumen, bilateral: Secondary | ICD-10-CM | POA: Diagnosis not present

## 2021-12-18 DIAGNOSIS — S300XXA Contusion of lower back and pelvis, initial encounter: Secondary | ICD-10-CM | POA: Diagnosis not present

## 2021-12-18 DIAGNOSIS — W19XXXA Unspecified fall, initial encounter: Secondary | ICD-10-CM | POA: Diagnosis not present

## 2022-01-02 ENCOUNTER — Ambulatory Visit: Payer: Medicare Other | Admitting: Student

## 2022-03-10 ENCOUNTER — Ambulatory Visit (INDEPENDENT_AMBULATORY_CARE_PROVIDER_SITE_OTHER): Payer: Medicare Other | Admitting: Podiatry

## 2022-03-10 DIAGNOSIS — M79675 Pain in left toe(s): Secondary | ICD-10-CM

## 2022-03-10 DIAGNOSIS — B351 Tinea unguium: Secondary | ICD-10-CM

## 2022-03-10 DIAGNOSIS — M79674 Pain in right toe(s): Secondary | ICD-10-CM | POA: Diagnosis not present

## 2022-03-10 NOTE — Progress Notes (Signed)
This patient returns to the office for evaluation and treatment of long thick painful nails .  This patient is unable to trim his own nails since the patient cannot reach the feet.  Patient says the nails are painful walking and wearing his shoes.  He returns for preventive foot care services.   She presents with a female guardian.  General Appearance  Alert, conversant and in no acute stress.  Vascular  Dorsalis pedis and posterior tibial  pulses are weakly  palpable  bilaterally.  Capillary return is within normal limits  bilaterally. Temperature is within normal limits  bilaterally.  Neurologic  Senn-Weinstein monofilament wire test diminished   bilaterally. Muscle power within normal limits bilaterally.  Nails Thick disfigured discolored nails with subungual debris  from hallux to fifth toes bilaterally. No evidence of bacterial infection or drainage bilaterally.  Orthopedic  No limitations of motion  feet .  No crepitus or effusions noted.  No bony pathology or digital deformities noted.  HAV  B/L.  Skin  normotropic skin with no porokeratosis noted bilaterally.  No signs of infections or ulcers noted.     Onychomycosis  Pain in toes right foot  Pain in toes left foot  Debridement  of nails  1-5  B/L with a nail nipper.  Nails were then filed using a dremel tool with no incidents.    RTC  4   months    Teruko Joswick DPM  

## 2022-03-27 DIAGNOSIS — R634 Abnormal weight loss: Secondary | ICD-10-CM | POA: Diagnosis not present

## 2022-03-27 DIAGNOSIS — L509 Urticaria, unspecified: Secondary | ICD-10-CM | POA: Diagnosis not present

## 2022-03-27 DIAGNOSIS — I10 Essential (primary) hypertension: Secondary | ICD-10-CM | POA: Diagnosis not present

## 2022-03-27 DIAGNOSIS — R059 Cough, unspecified: Secondary | ICD-10-CM | POA: Diagnosis not present

## 2022-04-29 DIAGNOSIS — R399 Unspecified symptoms and signs involving the genitourinary system: Secondary | ICD-10-CM | POA: Diagnosis not present

## 2022-06-01 DIAGNOSIS — H6123 Impacted cerumen, bilateral: Secondary | ICD-10-CM | POA: Diagnosis not present

## 2022-07-10 DIAGNOSIS — F0390 Unspecified dementia without behavioral disturbance: Secondary | ICD-10-CM | POA: Diagnosis not present

## 2022-07-10 DIAGNOSIS — Z23 Encounter for immunization: Secondary | ICD-10-CM | POA: Diagnosis not present

## 2022-07-10 DIAGNOSIS — R102 Pelvic and perineal pain: Secondary | ICD-10-CM | POA: Diagnosis not present

## 2022-07-13 ENCOUNTER — Ambulatory Visit: Payer: Medicare Other | Admitting: Podiatry

## 2022-07-14 ENCOUNTER — Encounter: Payer: Self-pay | Admitting: Podiatry

## 2022-07-14 ENCOUNTER — Ambulatory Visit (INDEPENDENT_AMBULATORY_CARE_PROVIDER_SITE_OTHER): Payer: Medicare Other | Admitting: Podiatry

## 2022-07-14 VITALS — BP 141/63 | HR 63

## 2022-07-14 DIAGNOSIS — B351 Tinea unguium: Secondary | ICD-10-CM

## 2022-07-14 DIAGNOSIS — M79674 Pain in right toe(s): Secondary | ICD-10-CM

## 2022-07-14 DIAGNOSIS — M79675 Pain in left toe(s): Secondary | ICD-10-CM | POA: Diagnosis not present

## 2022-07-14 NOTE — Progress Notes (Signed)
This patient returns to the office for evaluation and treatment of long thick painful nails .  This patient is unable to trim his own nails since the patient cannot reach the feet.  Patient says the nails are painful walking and wearing his shoes.  He returns for preventive foot care services.   She presents with a female guardian.  General Appearance  Alert, conversant and in no acute stress.  Vascular  Dorsalis pedis and posterior tibial  pulses are weakly  palpable  bilaterally.  Capillary return is within normal limits  bilaterally. Temperature is within normal limits  bilaterally.  Neurologic  Senn-Weinstein monofilament wire test diminished   bilaterally. Muscle power within normal limits bilaterally.  Nails Thick disfigured discolored nails with subungual debris  from hallux to fifth toes bilaterally. No evidence of bacterial infection or drainage bilaterally.  Orthopedic  No limitations of motion  feet .  No crepitus or effusions noted.  No bony pathology or digital deformities noted.  HAV  B/L.  Skin  normotropic skin with no porokeratosis noted bilaterally.  No signs of infections or ulcers noted.     Onychomycosis  Pain in toes right foot  Pain in toes left foot  Debridement  of nails  1-5  B/L with a nail nipper.  Nails were then filed using a dremel tool with no incidents.    RTC  4   months    Gardiner Barefoot DPM

## 2022-07-28 DIAGNOSIS — R059 Cough, unspecified: Secondary | ICD-10-CM | POA: Diagnosis not present

## 2022-07-28 DIAGNOSIS — J690 Pneumonitis due to inhalation of food and vomit: Secondary | ICD-10-CM | POA: Diagnosis not present

## 2022-07-28 DIAGNOSIS — Z20822 Contact with and (suspected) exposure to covid-19: Secondary | ICD-10-CM | POA: Diagnosis not present

## 2022-07-28 DIAGNOSIS — F0390 Unspecified dementia without behavioral disturbance: Secondary | ICD-10-CM | POA: Diagnosis not present

## 2022-07-28 DIAGNOSIS — J40 Bronchitis, not specified as acute or chronic: Secondary | ICD-10-CM | POA: Diagnosis not present

## 2022-08-25 DIAGNOSIS — E559 Vitamin D deficiency, unspecified: Secondary | ICD-10-CM | POA: Diagnosis not present

## 2022-08-25 DIAGNOSIS — I10 Essential (primary) hypertension: Secondary | ICD-10-CM | POA: Diagnosis not present

## 2022-08-25 DIAGNOSIS — I700 Atherosclerosis of aorta: Secondary | ICD-10-CM | POA: Diagnosis not present

## 2022-08-25 DIAGNOSIS — E785 Hyperlipidemia, unspecified: Secondary | ICD-10-CM | POA: Diagnosis not present

## 2022-08-25 DIAGNOSIS — Z0000 Encounter for general adult medical examination without abnormal findings: Secondary | ICD-10-CM | POA: Diagnosis not present

## 2022-08-25 DIAGNOSIS — I2510 Atherosclerotic heart disease of native coronary artery without angina pectoris: Secondary | ICD-10-CM | POA: Diagnosis not present

## 2022-08-25 DIAGNOSIS — F0390 Unspecified dementia without behavioral disturbance: Secondary | ICD-10-CM | POA: Diagnosis not present

## 2022-08-25 DIAGNOSIS — R102 Pelvic and perineal pain: Secondary | ICD-10-CM | POA: Diagnosis not present

## 2022-08-25 DIAGNOSIS — K219 Gastro-esophageal reflux disease without esophagitis: Secondary | ICD-10-CM | POA: Diagnosis not present

## 2022-08-25 DIAGNOSIS — E538 Deficiency of other specified B group vitamins: Secondary | ICD-10-CM | POA: Diagnosis not present

## 2022-08-25 DIAGNOSIS — B379 Candidiasis, unspecified: Secondary | ICD-10-CM | POA: Diagnosis not present

## 2022-08-25 DIAGNOSIS — J40 Bronchitis, not specified as acute or chronic: Secondary | ICD-10-CM | POA: Diagnosis not present

## 2022-08-26 DIAGNOSIS — I10 Essential (primary) hypertension: Secondary | ICD-10-CM | POA: Diagnosis not present

## 2022-08-26 DIAGNOSIS — R102 Pelvic and perineal pain: Secondary | ICD-10-CM | POA: Diagnosis not present

## 2022-09-10 DIAGNOSIS — N390 Urinary tract infection, site not specified: Secondary | ICD-10-CM | POA: Diagnosis not present

## 2022-09-18 DIAGNOSIS — R102 Pelvic and perineal pain: Secondary | ICD-10-CM | POA: Diagnosis not present

## 2022-09-18 DIAGNOSIS — N390 Urinary tract infection, site not specified: Secondary | ICD-10-CM | POA: Diagnosis not present

## 2022-09-22 DIAGNOSIS — R8290 Unspecified abnormal findings in urine: Secondary | ICD-10-CM | POA: Diagnosis not present

## 2022-09-23 DIAGNOSIS — L292 Pruritus vulvae: Secondary | ICD-10-CM | POA: Diagnosis not present

## 2022-09-23 DIAGNOSIS — B3731 Acute candidiasis of vulva and vagina: Secondary | ICD-10-CM | POA: Diagnosis not present

## 2022-09-28 DIAGNOSIS — W19XXXA Unspecified fall, initial encounter: Secondary | ICD-10-CM | POA: Diagnosis not present

## 2022-09-28 DIAGNOSIS — M25519 Pain in unspecified shoulder: Secondary | ICD-10-CM | POA: Diagnosis not present

## 2022-10-02 ENCOUNTER — Ambulatory Visit
Admission: RE | Admit: 2022-10-02 | Discharge: 2022-10-02 | Disposition: A | Discharge status: Home / Self Care | Payer: Medicare Other | Source: Ambulatory Visit | Attending: Physician Assistant | Admitting: Physician Assistant

## 2022-10-02 ENCOUNTER — Other Ambulatory Visit: Payer: Self-pay | Admitting: Physician Assistant

## 2022-10-02 DIAGNOSIS — S4991XA Unspecified injury of right shoulder and upper arm, initial encounter: Secondary | ICD-10-CM

## 2022-10-02 DIAGNOSIS — R54 Age-related physical debility: Secondary | ICD-10-CM | POA: Diagnosis not present

## 2022-10-02 DIAGNOSIS — W19XXXA Unspecified fall, initial encounter: Secondary | ICD-10-CM | POA: Diagnosis not present

## 2022-10-02 DIAGNOSIS — F0390 Unspecified dementia without behavioral disturbance: Secondary | ICD-10-CM | POA: Diagnosis not present

## 2022-10-02 DIAGNOSIS — N390 Urinary tract infection, site not specified: Secondary | ICD-10-CM | POA: Diagnosis not present

## 2022-10-02 DIAGNOSIS — M19011 Primary osteoarthritis, right shoulder: Secondary | ICD-10-CM | POA: Diagnosis not present

## 2022-10-06 ENCOUNTER — Emergency Department (HOSPITAL_COMMUNITY)
Admission: EM | Admit: 2022-10-06 | Discharge: 2022-10-06 | Disposition: A | Discharge status: Home / Self Care | Payer: Medicare Other | Attending: Emergency Medicine | Admitting: Emergency Medicine

## 2022-10-06 ENCOUNTER — Emergency Department (HOSPITAL_COMMUNITY): Payer: Medicare Other

## 2022-10-06 ENCOUNTER — Other Ambulatory Visit: Payer: Self-pay

## 2022-10-06 ENCOUNTER — Encounter (HOSPITAL_COMMUNITY): Payer: Self-pay

## 2022-10-06 DIAGNOSIS — I10 Essential (primary) hypertension: Secondary | ICD-10-CM | POA: Diagnosis not present

## 2022-10-06 DIAGNOSIS — I959 Hypotension, unspecified: Secondary | ICD-10-CM | POA: Diagnosis not present

## 2022-10-06 DIAGNOSIS — I2510 Atherosclerotic heart disease of native coronary artery without angina pectoris: Secondary | ICD-10-CM | POA: Diagnosis not present

## 2022-10-06 DIAGNOSIS — Z7982 Long term (current) use of aspirin: Secondary | ICD-10-CM | POA: Insufficient documentation

## 2022-10-06 DIAGNOSIS — S0990XA Unspecified injury of head, initial encounter: Secondary | ICD-10-CM | POA: Diagnosis not present

## 2022-10-06 DIAGNOSIS — R531 Weakness: Secondary | ICD-10-CM | POA: Insufficient documentation

## 2022-10-06 DIAGNOSIS — Z79899 Other long term (current) drug therapy: Secondary | ICD-10-CM | POA: Insufficient documentation

## 2022-10-06 DIAGNOSIS — R4182 Altered mental status, unspecified: Secondary | ICD-10-CM | POA: Diagnosis not present

## 2022-10-06 DIAGNOSIS — R059 Cough, unspecified: Secondary | ICD-10-CM | POA: Diagnosis not present

## 2022-10-06 LAB — CBC WITH DIFFERENTIAL/PLATELET
Abs Immature Granulocytes: 0.02 10*3/uL (ref 0.00–0.07)
Basophils Absolute: 0.1 10*3/uL (ref 0.0–0.1)
Basophils Relative: 1 %
Eosinophils Absolute: 0.4 10*3/uL (ref 0.0–0.5)
Eosinophils Relative: 5 %
HCT: 41.8 % (ref 36.0–46.0)
Hemoglobin: 12.8 g/dL (ref 12.0–15.0)
Immature Granulocytes: 0 %
Lymphocytes Relative: 28 %
Lymphs Abs: 2.3 10*3/uL (ref 0.7–4.0)
MCH: 28.2 pg (ref 26.0–34.0)
MCHC: 30.6 g/dL (ref 30.0–36.0)
MCV: 92.1 fL (ref 80.0–100.0)
Monocytes Absolute: 0.7 10*3/uL (ref 0.1–1.0)
Monocytes Relative: 8 %
Neutro Abs: 4.8 10*3/uL (ref 1.7–7.7)
Neutrophils Relative %: 58 %
Platelets: 174 10*3/uL (ref 150–400)
RBC: 4.54 MIL/uL (ref 3.87–5.11)
RDW: 12.4 % (ref 11.5–15.5)
WBC: 8.3 10*3/uL (ref 4.0–10.5)
nRBC: 0 % (ref 0.0–0.2)

## 2022-10-06 LAB — URINALYSIS, W/ REFLEX TO CULTURE (INFECTION SUSPECTED)
Bacteria, UA: NONE SEEN
Bilirubin Urine: NEGATIVE
Glucose, UA: NEGATIVE mg/dL
Hgb urine dipstick: NEGATIVE
Ketones, ur: NEGATIVE mg/dL
Leukocytes,Ua: NEGATIVE
Nitrite: NEGATIVE
Protein, ur: NEGATIVE mg/dL
Specific Gravity, Urine: 1.015 (ref 1.005–1.030)
pH: 6 (ref 5.0–8.0)

## 2022-10-06 LAB — COMPREHENSIVE METABOLIC PANEL
ALT: 13 U/L (ref 0–44)
AST: 23 U/L (ref 15–41)
Albumin: 3.6 g/dL (ref 3.5–5.0)
Alkaline Phosphatase: 52 U/L (ref 38–126)
Anion gap: 8 (ref 5–15)
BUN: 17 mg/dL (ref 8–23)
CO2: 28 mmol/L (ref 22–32)
Calcium: 9.2 mg/dL (ref 8.9–10.3)
Chloride: 102 mmol/L (ref 98–111)
Creatinine, Ser: 0.82 mg/dL (ref 0.44–1.00)
GFR, Estimated: >60 mL/min (ref >60)
Glucose, Bld: 94 mg/dL (ref 70–99)
Potassium: 3.9 mmol/L (ref 3.5–5.1)
Sodium: 138 mmol/L (ref 135–145)
Total Bilirubin: 1.1 mg/dL (ref 0.3–1.2)
Total Protein: 9 g/dL — ABNORMAL HIGH (ref 6.5–8.1)

## 2022-10-06 LAB — AMMONIA: Ammonia: 16 umol/L (ref 9–35)

## 2022-10-06 LAB — TROPONIN I (HIGH SENSITIVITY)
Troponin I (High Sensitivity): 11 ng/L (ref <18)
Troponin I (High Sensitivity): 13 ng/L (ref <18)

## 2022-10-06 LAB — LACTIC ACID, PLASMA: Lactic Acid, Venous: 1.5 mmol/L (ref 0.5–1.9)

## 2022-10-06 MED ORDER — LACTATED RINGERS IV BOLUS
500.0000 mL | Freq: Once | INTRAVENOUS | Status: AC
Start: 2022-10-06 — End: 2022-10-06
  Administered 2022-10-06: 500 mL via INTRAVENOUS

## 2022-10-06 NOTE — ED Triage Notes (Signed)
[facility] reports pt coming from home. Caretaker states pt went unresponsive while in the bathroom, caretaker states she carried her to the kitchen, episode lasted about a minute. Pt w/hx of UTI.

## 2022-10-06 NOTE — ED Provider Notes (Signed)
[place] EMERGENCY DEPARTMENT AT [facility] Provider Note   CSN: [id number] Arrival date & time: 10/06/22  1620     History  Chief Complaint  Patient presents with   Altered Mental Status    Ashley Willis is a 89 y.o. female.  Patient is a 89 year old female with a history of CAD, anemia, rheumatoid arthritis, hypertension, hyperlipidemia who is presenting today by ambulance after a syncopal episode at home.  Patient saw her PCP on 10/02/22 for possible persistent UTI and a fall and at that time she had already been started on Macrobid on 09/28/2022 for UTI.  Patient's caretaker reports that today she was in the bathroom and she became unresponsive and the caretaker carried her to the kitchen.  She reports the episode lasted approximately 1 minute.  Patient currently has no complaints but appears very weak in the bed.  She denies any nausea or vomiting.  However she is unable to pull herself to a seated position in the bed.  Normally she reports using a walker at home.  She denies any abdominal pain, chest pain or shortness of breath at this time.  No 1 else is present currently in the room to give further details.  The history is provided by the patient, medical records, the EMS personnel and a caregiver.  Altered Mental Status      Home Medications Prior to Admission medications   Medication Sig Start Date End Date Taking? Authorizing Provider  aspirin EC 81 MG tablet Take 81 mg by mouth daily. Swallow whole.    [provider]  atorvastatin (LIPITOR) 40 MG tablet TAKE 1 TABLET (40 MG TOTAL) BY MOUTH DAILY. 07/21/12   Norins, Rosalyn Gess, MD  Calcium Carbonate-Vitamin D 600-400 MG-UNIT tablet Take 1 tablet by mouth daily.      [provider]  cholecalciferol (VITAMIN D) 1000 UNITS tablet Take 2,000 Units by mouth daily.    [provider]  dextromethorphan 15 MG/5ML syrup Take 10 mLs (30 mg total) by mouth 4 (four) times daily as needed for  cough. 10/31/21   Omar Person, MD  fluticasone (FLONASE) 50 MCG/ACT nasal spray Place 1 spray into both nostrils daily. 10/31/21   Omar Person, MD  gabapentin (NEURONTIN) 100 MG capsule Take 100 mg by mouth daily.    [provider]  pantoprazole (PROTONIX) 40 MG tablet Take 40 mg by mouth daily.    [provider]      Allergies    Codeine    Review of Systems   Review of Systems  Physical Exam Updated Vital Signs BP (!) 126/39   Pulse 66   Temp 98.3 F (36.8 C) (Oral)   Resp (!) 21   SpO2 96%  Physical Exam Vitals and nursing note reviewed.  Constitutional:      General: She is not in acute distress.    Appearance: She is well-developed.  HENT:     Head: Normocephalic.     Comments: Healing ecchymosis over the left cheek    Mouth/Throat:     Mouth: Mucous membranes are dry.  Eyes:     Pupils: Pupils are equal, round, and reactive to light.  Cardiovascular:     Rate and Rhythm: Normal rate and regular rhythm.     Heart sounds: Normal heart sounds. No murmur heard.    No friction rub.  Pulmonary:     Effort: Pulmonary effort is normal.     Breath sounds: Normal breath sounds. No  wheezing or rales.  Abdominal:     General: Bowel sounds are normal. There is no distension.     Palpations: Abdomen is soft.     Tenderness: There is no abdominal tenderness. There is no guarding or rebound.  Musculoskeletal:        General: No tenderness. Normal range of motion.     Cervical back: Normal range of motion and neck supple. No tenderness.     Right lower leg: No edema.     Left lower leg: No edema.     Comments: No edema  Skin:    General: Skin is warm and dry.     Coloration: Skin is pale.     Findings: No rash.  Neurological:     Mental Status: She is alert and oriented to person, place, and time.     Cranial Nerves: No cranial nerve deficit.     Comments: General weakness all over.  4/5 in all extremities  Psychiatric:        Mood and  Affect: Mood normal.        Behavior: Behavior normal.     ED Results / Procedures / Treatments   Labs (all labs ordered are listed, but only abnormal results are displayed) Labs Reviewed  COMPREHENSIVE METABOLIC PANEL - Abnormal; Notable for the following components:      Result Value   Total Protein 9.0 (*)    All other components within normal limits  CBC WITH DIFFERENTIAL/PLATELET  LACTIC ACID, PLASMA  URINALYSIS, W/ REFLEX TO CULTURE (INFECTION SUSPECTED)  AMMONIA  LACTIC ACID, PLASMA  TROPONIN I (HIGH SENSITIVITY)  TROPONIN I (HIGH SENSITIVITY)    EKG EKG Interpretation  Date/Time:  Tuesday October 06 2022 16:35:09 EDT Ventricular Rate:  70 PR Interval:  162 QRS Duration: 90 QT Interval:  457 QTC Calculation: 494 R Axis:   -5 Text Interpretation: Sinus rhythm Ventricular premature complex Left ventricular hypertrophy Abnormal T, consider ischemia, lateral leads changed from last EKG in 2006 Confirmed by Gwyneth Sprout ([id number]) on 10/06/2022 5:11:45 PM  Radiology CT Head Wo Contrast  Result Date: 10/06/2022 CLINICAL DATA:  Minor head trauma.  Unresponsive episode. EXAM: CT HEAD WITHOUT CONTRAST TECHNIQUE: Contiguous axial images were obtained from the base of the skull through the vertex without intravenous contrast. RADIATION DOSE REDUCTION: This exam was performed according to the departmental dose-optimization program which includes automated exposure control, adjustment of the mA and/or kV according to patient size and/or use of iterative reconstruction technique. COMPARISON:  March 10, 2012 FINDINGS: Brain: Diffuse cerebral atrophy. Ventricular dilatation consistent with central atrophy. Low-attenuation changes in the deep white matter consistent with small vessel ischemia. No abnormal extra-axial fluid collections. No mass effect or midline shift. Gray-white matter junctions are distinct. Basal cisterns are not effaced. No acute intracranial hemorrhage. Vascular: No  hyperdense vessel or unexpected calcification. Skull: Normal. Negative for fracture or focal lesion. Sinuses/Orbits: Mucosal thickening or retention cyst in the left maxillary antrum. Paranasal sinuses are otherwise clear. Other: None. IMPRESSION: No acute intracranial abnormalities. Chronic atrophy and small vessel ischemic changes. Electronically Signed   By: Burman Nieves M.D.   On: 10/06/2022 18:46   DG Chest Port 1 View  Result Date: 10/06/2022 CLINICAL DATA:  Weakness EXAM: PORTABLE CHEST 1 VIEW COMPARISON:  CXR 10/31/2021 FINDINGS: No pleural effusion. No pneumothorax. There are prominent bilateral interstitial opacities with more focal opacities in the bilateral lung bases. These appear unchanged from prior exam no radiographically apparent displaced rib fractures. Severe degenerative changes  of the bilateral glenohumeral joints. Visualized upper abdomen is unremarkable. IMPRESSION: Redemonstrated prominent bilateral interstitial opacities with more focal opacities in the bilateral lung bases. Findings may represent changes related to an underlying chronic lung disease, but superimposed infection is not excluded. Electronically Signed   By: Lorenza Cambridge M.D.   On: 10/06/2022 17:09    Procedures Procedures    Medications Ordered in ED Medications  lactated ringers bolus 500 mL (0 mLs Intravenous Stopped 10/06/22 1816)    ED Course/ Medical Decision Making/ A&P                             Medical Decision Making Amount and/or Complexity of Data Reviewed Independent Historian: caregiver    Details: daughter External Data Reviewed: notes. Labs: ordered. Decision-making details documented in ED Course. Radiology: ordered and independent interpretation performed. Decision-making details documented in ED Course. ECG/medicine tests: ordered and independent interpretation performed. Decision-making details documented in ED Course.   Pt with multiple medical problems and comorbidities  and presenting today with a complaint that caries a high risk for morbidity and mortality.  Here today after a syncopal episode at home and weakness.  Patient is recently had a fall and been treated for UTI.  Appears to still be on Bactrim.  Patient denies prior history of syncope.  Does have a known cardiac disease status post stents.  Concern for possible dysrhythmia, MI, reaction to antibiotics, worsening infection, pneumonia, dehydration, AKI.  Low suspicion for PE as patient is not tachycardic and is not on a beta-blocker, sats are normal breath sounds are clear.  I independently interpreted patient's EKG today she does have T wave inversion anterior laterally in a sinus rhythm.  Her last EKG was in 2006 and it is changed from then however unclear how long her EKG is look like this.  No criteria for STEMI.  Patient given some IV fluids, labs are pending.  8:29 PM Pt's daughter is here and now states that pt was very sleepy today.  She had told her daughter initially when waking up she did not feel well and her daughter gave her her morning medications including gabapentin and she went and lay back down to go to sleep.  The aide that watches her reports that she had to wake her up at 2 PM today and she just seemed really out of it.  She was able to walk into the bathroom but was just not acting herself so they called 911.  I independently interpreted patient's labs today.  Her daughter brought in EKG from several weeks ago which looks identical to the one from today.  CBC, CMP, UA, ammonia, lactic acid, troponin x 2 are all within normal limits.  I have independently visualized and interpreted pt's images today.  CT of the head showed no evidence of intracranial hemorrhage and patient is not having symptoms classic for stroke.  Chest x-ray today shows chronic lung findings.  Speaking with the patient's daughter she has not had a new cough, fever or any infectious symptoms.  All these findings were discussed  with the patient's daughter.  Unclear why this happened today but with all reassuring labs feel that patient is stable to go home.  She was able to ambulate with her daughter to the bathroom and she feels that she is at her baseline.  We will discontinue the antibiotic as she does not appear to have signs of a UTI today.  Also will have her daughter change her gabapentin to evening so maybe she will have some much daytime drowsiness.  They are comfortable with this plan.  They are given return precautions.          Final Clinical Impression(s) / ED Diagnoses Final diagnoses:  Weakness    Rx / DC Orders ED Discharge Orders     None         Gwyneth Sprout, MD 10/06/22 2029

## 2022-10-06 NOTE — Discharge Instructions (Signed)
Stop the antibiotics it does not appear that there is any infection at this time.  Try giving the gabapentin at night instead of in the morning which may help with daytime drowsiness.  If you start having fever, vomiting, shortness of breath, weakness on one side your body or other concerns return to the emergency room.  Make sure you are staying hydrated.

## 2022-10-20 DIAGNOSIS — R55 Syncope and collapse: Secondary | ICD-10-CM | POA: Diagnosis not present

## 2022-10-20 DIAGNOSIS — M25511 Pain in right shoulder: Secondary | ICD-10-CM | POA: Diagnosis not present

## 2022-10-23 DIAGNOSIS — B3731 Acute candidiasis of vulva and vagina: Secondary | ICD-10-CM | POA: Diagnosis not present

## 2022-10-27 DIAGNOSIS — F0280 Dementia in other diseases classified elsewhere without behavioral disturbance: Secondary | ICD-10-CM | POA: Diagnosis not present

## 2022-10-27 DIAGNOSIS — Z9181 History of falling: Secondary | ICD-10-CM | POA: Diagnosis not present

## 2022-10-27 DIAGNOSIS — Z556 Problems related to health literacy: Secondary | ICD-10-CM | POA: Diagnosis not present

## 2022-10-27 DIAGNOSIS — M810 Age-related osteoporosis without current pathological fracture: Secondary | ICD-10-CM | POA: Diagnosis not present

## 2022-10-27 DIAGNOSIS — M25511 Pain in right shoulder: Secondary | ICD-10-CM | POA: Diagnosis not present

## 2022-10-27 DIAGNOSIS — I10 Essential (primary) hypertension: Secondary | ICD-10-CM | POA: Diagnosis not present

## 2022-10-27 DIAGNOSIS — K859 Acute pancreatitis without necrosis or infection, unspecified: Secondary | ICD-10-CM | POA: Diagnosis not present

## 2022-10-27 DIAGNOSIS — I2510 Atherosclerotic heart disease of native coronary artery without angina pectoris: Secondary | ICD-10-CM | POA: Diagnosis not present

## 2022-10-27 DIAGNOSIS — E538 Deficiency of other specified B group vitamins: Secondary | ICD-10-CM | POA: Diagnosis not present

## 2022-10-27 DIAGNOSIS — B9689 Other specified bacterial agents as the cause of diseases classified elsewhere: Secondary | ICD-10-CM | POA: Diagnosis not present

## 2022-10-27 DIAGNOSIS — M069 Rheumatoid arthritis, unspecified: Secondary | ICD-10-CM | POA: Diagnosis not present

## 2022-10-27 DIAGNOSIS — Z604 Social exclusion and rejection: Secondary | ICD-10-CM | POA: Diagnosis not present

## 2022-10-27 DIAGNOSIS — I361 Nonrheumatic tricuspid (valve) insufficiency: Secondary | ICD-10-CM | POA: Diagnosis not present

## 2022-10-27 DIAGNOSIS — D649 Anemia, unspecified: Secondary | ICD-10-CM | POA: Diagnosis not present

## 2022-10-27 DIAGNOSIS — N390 Urinary tract infection, site not specified: Secondary | ICD-10-CM | POA: Diagnosis not present

## 2022-10-27 DIAGNOSIS — I700 Atherosclerosis of aorta: Secondary | ICD-10-CM | POA: Diagnosis not present

## 2022-10-30 DIAGNOSIS — M25511 Pain in right shoulder: Secondary | ICD-10-CM | POA: Diagnosis not present

## 2022-10-30 DIAGNOSIS — E538 Deficiency of other specified B group vitamins: Secondary | ICD-10-CM | POA: Diagnosis not present

## 2022-10-30 DIAGNOSIS — I700 Atherosclerosis of aorta: Secondary | ICD-10-CM | POA: Diagnosis not present

## 2022-10-30 DIAGNOSIS — M069 Rheumatoid arthritis, unspecified: Secondary | ICD-10-CM | POA: Diagnosis not present

## 2022-10-30 DIAGNOSIS — I10 Essential (primary) hypertension: Secondary | ICD-10-CM | POA: Diagnosis not present

## 2022-10-30 DIAGNOSIS — F0280 Dementia in other diseases classified elsewhere without behavioral disturbance: Secondary | ICD-10-CM | POA: Diagnosis not present

## 2022-11-03 DIAGNOSIS — F0280 Dementia in other diseases classified elsewhere without behavioral disturbance: Secondary | ICD-10-CM | POA: Diagnosis not present

## 2022-11-03 DIAGNOSIS — I10 Essential (primary) hypertension: Secondary | ICD-10-CM | POA: Diagnosis not present

## 2022-11-03 DIAGNOSIS — E538 Deficiency of other specified B group vitamins: Secondary | ICD-10-CM | POA: Diagnosis not present

## 2022-11-03 DIAGNOSIS — M069 Rheumatoid arthritis, unspecified: Secondary | ICD-10-CM | POA: Diagnosis not present

## 2022-11-03 DIAGNOSIS — M25511 Pain in right shoulder: Secondary | ICD-10-CM | POA: Diagnosis not present

## 2022-11-03 DIAGNOSIS — I700 Atherosclerosis of aorta: Secondary | ICD-10-CM | POA: Diagnosis not present

## 2022-11-07 ENCOUNTER — Encounter (HOSPITAL_COMMUNITY): Payer: Self-pay

## 2022-11-07 ENCOUNTER — Other Ambulatory Visit: Payer: Self-pay

## 2022-11-07 ENCOUNTER — Observation Stay (HOSPITAL_COMMUNITY)
Admission: EM | Admit: 2022-11-07 | Discharge: 2022-11-08 | Disposition: A | Discharge status: Home Health | Payer: Medicare Other | Attending: Family Medicine | Admitting: Family Medicine

## 2022-11-07 ENCOUNTER — Emergency Department (HOSPITAL_COMMUNITY): Payer: Medicare Other

## 2022-11-07 DIAGNOSIS — I10 Essential (primary) hypertension: Secondary | ICD-10-CM | POA: Insufficient documentation

## 2022-11-07 DIAGNOSIS — R0682 Tachypnea, not elsewhere classified: Secondary | ICD-10-CM | POA: Diagnosis not present

## 2022-11-07 DIAGNOSIS — Z7982 Long term (current) use of aspirin: Secondary | ICD-10-CM | POA: Diagnosis not present

## 2022-11-07 DIAGNOSIS — R2681 Unsteadiness on feet: Secondary | ICD-10-CM | POA: Insufficient documentation

## 2022-11-07 DIAGNOSIS — R0689 Other abnormalities of breathing: Secondary | ICD-10-CM | POA: Diagnosis not present

## 2022-11-07 DIAGNOSIS — N3000 Acute cystitis without hematuria: Secondary | ICD-10-CM | POA: Diagnosis not present

## 2022-11-07 DIAGNOSIS — Z9181 History of falling: Secondary | ICD-10-CM | POA: Insufficient documentation

## 2022-11-07 DIAGNOSIS — R911 Solitary pulmonary nodule: Secondary | ICD-10-CM | POA: Diagnosis not present

## 2022-11-07 DIAGNOSIS — R531 Weakness: Secondary | ICD-10-CM | POA: Diagnosis present

## 2022-11-07 DIAGNOSIS — Z79899 Other long term (current) drug therapy: Secondary | ICD-10-CM | POA: Diagnosis not present

## 2022-11-07 DIAGNOSIS — I2510 Atherosclerotic heart disease of native coronary artery without angina pectoris: Secondary | ICD-10-CM | POA: Diagnosis present

## 2022-11-07 DIAGNOSIS — Z1152 Encounter for screening for COVID-19: Secondary | ICD-10-CM | POA: Insufficient documentation

## 2022-11-07 DIAGNOSIS — R053 Chronic cough: Secondary | ICD-10-CM

## 2022-11-07 DIAGNOSIS — N390 Urinary tract infection, site not specified: Principal | ICD-10-CM | POA: Diagnosis present

## 2022-11-07 DIAGNOSIS — R2689 Other abnormalities of gait and mobility: Secondary | ICD-10-CM | POA: Insufficient documentation

## 2022-11-07 DIAGNOSIS — R059 Cough, unspecified: Secondary | ICD-10-CM | POA: Diagnosis not present

## 2022-11-07 DIAGNOSIS — F0390 Unspecified dementia without behavioral disturbance: Secondary | ICD-10-CM

## 2022-11-07 DIAGNOSIS — I959 Hypotension, unspecified: Secondary | ICD-10-CM | POA: Diagnosis not present

## 2022-11-07 DIAGNOSIS — R918 Other nonspecific abnormal finding of lung field: Secondary | ICD-10-CM | POA: Diagnosis not present

## 2022-11-07 LAB — URINALYSIS, ROUTINE W REFLEX MICROSCOPIC
Bilirubin Urine: NEGATIVE
Glucose, UA: NEGATIVE mg/dL
Hgb urine dipstick: NEGATIVE
Ketones, ur: NEGATIVE mg/dL
Nitrite: POSITIVE — AB
Protein, ur: NEGATIVE mg/dL
Specific Gravity, Urine: 1.035 — ABNORMAL HIGH (ref 1.005–1.030)
Trans Epithel, UA: 1
pH: 6 (ref 5.0–8.0)

## 2022-11-07 LAB — RESP PANEL BY RT-PCR (RSV, FLU A&B, COVID)  RVPGX2
Influenza A by PCR: NEGATIVE
Influenza B by PCR: NEGATIVE
Resp Syncytial Virus by PCR: NEGATIVE
SARS Coronavirus 2 by RT PCR: NEGATIVE

## 2022-11-07 LAB — COMPREHENSIVE METABOLIC PANEL
ALT: 20 U/L (ref 0–44)
AST: 32 U/L (ref 15–41)
Albumin: 3.7 g/dL (ref 3.5–5.0)
Alkaline Phosphatase: 61 U/L (ref 38–126)
Anion gap: 8 (ref 5–15)
BUN: 16 mg/dL (ref 8–23)
CO2: 26 mmol/L (ref 22–32)
Calcium: 8.8 mg/dL — ABNORMAL LOW (ref 8.9–10.3)
Chloride: 100 mmol/L (ref 98–111)
Creatinine, Ser: 0.92 mg/dL (ref 0.44–1.00)
GFR, Estimated: 58 mL/min — ABNORMAL LOW (ref >60)
Glucose, Bld: 138 mg/dL — ABNORMAL HIGH (ref 70–99)
Potassium: 3.3 mmol/L — ABNORMAL LOW (ref 3.5–5.1)
Sodium: 134 mmol/L — ABNORMAL LOW (ref 135–145)
Total Bilirubin: 0.7 mg/dL (ref 0.3–1.2)
Total Protein: 8.8 g/dL — ABNORMAL HIGH (ref 6.5–8.1)

## 2022-11-07 LAB — CBC WITH DIFFERENTIAL/PLATELET
Abs Immature Granulocytes: 0.01 10*3/uL (ref 0.00–0.07)
Basophils Absolute: 0 10*3/uL (ref 0.0–0.1)
Basophils Relative: 1 %
Eosinophils Absolute: 0 10*3/uL (ref 0.0–0.5)
Eosinophils Relative: 1 %
HCT: 41.1 % (ref 36.0–46.0)
Hemoglobin: 12.9 g/dL (ref 12.0–15.0)
Immature Granulocytes: 0 %
Lymphocytes Relative: 49 %
Lymphs Abs: 1.9 10*3/uL (ref 0.7–4.0)
MCH: 27.8 pg (ref 26.0–34.0)
MCHC: 31.4 g/dL (ref 30.0–36.0)
MCV: 88.6 fL (ref 80.0–100.0)
Monocytes Absolute: 0.5 10*3/uL (ref 0.1–1.0)
Monocytes Relative: 12 %
Neutro Abs: 1.5 10*3/uL — ABNORMAL LOW (ref 1.7–7.7)
Neutrophils Relative %: 37 %
Platelets: 135 10*3/uL — ABNORMAL LOW (ref 150–400)
RBC: 4.64 MIL/uL (ref 3.87–5.11)
RDW: 12.8 % (ref 11.5–15.5)
WBC: 4 10*3/uL (ref 4.0–10.5)
nRBC: 0 % (ref 0.0–0.2)

## 2022-11-07 MED ORDER — IPRATROPIUM-ALBUTEROL 0.5-2.5 (3) MG/3ML IN SOLN
3.0000 mL | Freq: Three times a day (TID) | RESPIRATORY_TRACT | Status: DC
  Administered 2022-11-07: 3 mL via RESPIRATORY_TRACT
  Filled 2022-11-07: qty 3

## 2022-11-07 MED ORDER — DEXTROMETHORPHAN POLISTIREX ER 30 MG/5ML PO SUER: 5.0000 mL | Freq: Four times a day (QID) | ORAL | Status: DC | PRN

## 2022-11-07 MED ORDER — GUAIFENESIN ER 600 MG PO TB12
600.0000 mg | ORAL_TABLET | Freq: Two times a day (BID) | ORAL | Status: DC
  Administered 2022-11-07 – 2022-11-08 (×2): 600 mg via ORAL
  Filled 2022-11-07 (×2): qty 1

## 2022-11-07 MED ORDER — ACETAMINOPHEN 650 MG RE SUPP: 650.0000 mg | Freq: Four times a day (QID) | RECTAL | Status: DC | PRN

## 2022-11-07 MED ORDER — SENNOSIDES-DOCUSATE SODIUM 8.6-50 MG PO TABS: 1.0000 | ORAL_TABLET | Freq: Every evening | ORAL | Status: DC | PRN

## 2022-11-07 MED ORDER — IPRATROPIUM BROMIDE 0.02 % IN SOLN: 0.5000 mg | Freq: Four times a day (QID) | RESPIRATORY_TRACT | Status: DC

## 2022-11-07 MED ORDER — ONDANSETRON HCL 4 MG/2ML IJ SOLN: 4.0000 mg | Freq: Four times a day (QID) | INTRAMUSCULAR | Status: DC | PRN

## 2022-11-07 MED ORDER — SODIUM CHLORIDE 0.9 % IV BOLUS
1000.0000 mL | Freq: Once | INTRAVENOUS | Status: AC
  Administered 2022-11-07: 1000 mL via INTRAVENOUS

## 2022-11-07 MED ORDER — SODIUM CHLORIDE 0.9 % IV SOLN
1.0000 g | Freq: Once | INTRAVENOUS | Status: AC
  Administered 2022-11-07: 1 g via INTRAVENOUS
  Filled 2022-11-07: qty 10

## 2022-11-07 MED ORDER — ENOXAPARIN SODIUM 30 MG/0.3ML IJ SOSY
30.0000 mg | PREFILLED_SYRINGE | INTRAMUSCULAR | Status: DC
  Administered 2022-11-07: 30 mg via SUBCUTANEOUS
  Filled 2022-11-07: qty 0.3

## 2022-11-07 MED ORDER — ACETAMINOPHEN 325 MG PO TABS: 650.0000 mg | ORAL_TABLET | Freq: Four times a day (QID) | ORAL | Status: DC | PRN

## 2022-11-07 MED ORDER — IPRATROPIUM-ALBUTEROL 0.5-2.5 (3) MG/3ML IN SOLN: 3.0000 mL | Freq: Three times a day (TID) | RESPIRATORY_TRACT | Status: DC

## 2022-11-07 MED ORDER — PANTOPRAZOLE SODIUM 40 MG PO TBEC
40.0000 mg | DELAYED_RELEASE_TABLET | Freq: Every day | ORAL | Status: DC
  Administered 2022-11-07 – 2022-11-08 (×2): 40 mg via ORAL
  Filled 2022-11-07 (×2): qty 1

## 2022-11-07 MED ORDER — POTASSIUM CHLORIDE 20 MEQ PO PACK
40.0000 meq | PACK | Freq: Once | ORAL | Status: AC
  Administered 2022-11-07: 40 meq via ORAL
  Filled 2022-11-07: qty 2

## 2022-11-07 MED ORDER — ONDANSETRON HCL 4 MG PO TABS: 4.0000 mg | ORAL_TABLET | Freq: Four times a day (QID) | ORAL | Status: DC | PRN

## 2022-11-07 MED ORDER — IOHEXOL 300 MG/ML  SOLN
75.0000 mL | Freq: Once | INTRAMUSCULAR | Status: AC | PRN
  Administered 2022-11-07: 75 mL via INTRAVENOUS

## 2022-11-07 NOTE — ED Provider Notes (Signed)
[place] EMERGENCY DEPARTMENT AT [facility] Provider Note   CSN: [id number] Arrival date & time: 11/07/22  1413     History Chief Complaint  Patient presents with   Cough    Ashley Willis is a 89 y.o. female. Patient presents to the ED for cough. Was brought in by EMS after concerns for a worsening cough over the last week. VS stable with EMS and on arrival in the ED with no supplemental O2. Patient's daughter at bedside who reports that patient has become more lethargic and weak over the past week during the time that her coughing became more productive. Denies fevers, shortness of breath, headache, chest pain, nausea, vomiting, or diarrhea. Appetite decreased due to feeling poorly. No obvious sick contacts at home.  Patient does not have any terminal illness diagnosed at this time, but daughter reports that PCP has been working to "cut down" some of her medications and recently stopped gabapentin, a statin, and one other medication daughter is not sure of.  No known history of CHF, pulmonary hypertension, acid reflux, malignancy, or chronic bronchitis.   Cough      Home Medications Prior to Admission medications   Medication Sig Start Date End Date Taking? Authorizing Provider  cetirizine (ZYRTEC) 10 MG chewable tablet Chew 10 mg by mouth daily.   Yes [provider]  cholecalciferol (VITAMIN D) 1000 UNITS tablet Take 2,000 Units by mouth daily.   Yes [provider]  ipratropium-albuterol (DUONEB) 0.5-2.5 (3) MG/3ML SOLN Inhale 3 mLs into the lungs every 6 (six) hours as needed (For shortness of breath). 07/28/22  Yes [provider]  atorvastatin (LIPITOR) 40 MG tablet TAKE 1 TABLET (40 MG TOTAL) BY MOUTH DAILY. Patient not taking: Reported on 11/07/2022 07/21/12   Jacques Navy, MD  dextromethorphan 15 MG/5ML syrup Take 10 mLs (30 mg total) by mouth 4 (four) times daily as needed for cough. Patient not taking: Reported on 11/07/2022  10/31/21   Omar Person, MD  fluticasone [facility]) 50 MCG/ACT nasal spray Place 1 spray into both nostrils daily. Patient not taking: Reported on 11/07/2022 10/31/21   Omar Person, MD      Allergies    Codeine    Review of Systems   Review of Systems  Respiratory:  Positive for cough.   All other systems reviewed and are negative.   Physical Exam Updated Vital Signs BP (!) 147/64 (BP Location: Left Arm)   Pulse 70   Temp 98.1 F (36.7 C) (Oral)   Resp 19   Ht 5\' 2"  (1.575 m)   Wt 47.4 kg   SpO2 96%   BMI 19.11 kg/m  Physical Exam Vitals and nursing note reviewed.  Constitutional:      General: She is not in acute distress.    Appearance: She is well-developed.  HENT:     Head: Normocephalic and atraumatic.  Eyes:     Conjunctiva/sclera: Conjunctivae normal.  Cardiovascular:     Rate and Rhythm: Normal rate and regular rhythm.     Heart sounds: No murmur heard. Pulmonary:     Effort: Pulmonary effort is normal. No respiratory distress.     Breath sounds: Rhonchi present.  Abdominal:     Palpations: Abdomen is soft.     Tenderness: There is no abdominal tenderness.  Musculoskeletal:        General: No swelling.     Cervical back: Neck supple.  Skin:    General: Skin is warm and dry.  Capillary Refill: Capillary refill takes less than 2 seconds.     Findings: No bruising, erythema or rash.  Neurological:     Mental Status: She is alert.  Psychiatric:        Mood and Affect: Mood normal.     ED Results / Procedures / Treatments   Labs (all labs ordered are listed, but only abnormal results are displayed) Labs Reviewed  COMPREHENSIVE METABOLIC PANEL - Abnormal; Notable for the following components:      Result Value   Sodium 134 (*)    Potassium 3.3 (*)    Glucose, Bld 138 (*)    Calcium 8.8 (*)    Total Protein 8.8 (*)    GFR, Estimated 58 (*)    All other components within normal limits  CBC WITH DIFFERENTIAL/PLATELET - Abnormal;  Notable for the following components:   Platelets 135 (*)    Neutro Abs 1.5 (*)    All other components within normal limits  URINALYSIS, ROUTINE W REFLEX MICROSCOPIC - Abnormal; Notable for the following components:   APPearance CLOUDY (*)    Specific Gravity, Urine 1.035 (*)    Nitrite POSITIVE (*)    Leukocytes,Ua MODERATE (*)    Bacteria, UA RARE (*)    All other components within normal limits  RESP PANEL BY RT-PCR (RSV, FLU A&B, COVID)  RVPGX2  URINE CULTURE  CBC  BASIC METABOLIC PANEL    EKG EKG Interpretation  Date/Time:  Saturday Nov 07 2022 15:31:13 EDT Ventricular Rate:  77 PR Interval:  154 QRS Duration: 94 QT Interval:  437 QTC Calculation: 495 R Axis:   -19 Text Interpretation: Sinus rhythm Inferior infarct, old Abnrm T, consider ischemia, anterolateral lds since last tracing no significant change Confirmed by Rolan Bucco [phone]) on 11/07/2022 4:21:49 PM  Radiology CT Chest W Contrast  Result Date: 11/07/2022 CLINICAL DATA:  Worsening cough over the past week. EXAM: CT CHEST WITH CONTRAST TECHNIQUE: Multidetector CT imaging of the chest was performed during intravenous contrast administration. RADIATION DOSE REDUCTION: This exam was performed according to the departmental dose-optimization program which includes automated exposure control, adjustment of the mA and/or kV according to patient size and/or use of iterative reconstruction technique. CONTRAST:  75mL OMNIPAQUE IOHEXOL 300 MG/ML  SOLN COMPARISON:  Chest x-ray from same day. CT chest dated Nov 06, 2004. FINDINGS: Cardiovascular: No significant vascular findings. Normal heart size. No pericardial effusion. No thoracic aortic aneurysm or dissection. Coronary, aortic arch, and branch vessel atherosclerotic vascular disease. No central pulmonary embolism. Mediastinum/Nodes: No enlarged mediastinal, hilar, or axillary lymph nodes. Thyroid gland, trachea, and esophagus demonstrate no significant findings.  Lungs/Pleura: No focal consolidation, pleural effusion, or pneumothorax. 6 x 5 mm mixed ground-glass and solid nodule in the left upper lobe, the solid component measuring 3 mm (series 4, image 61), new since the prior study. Additional 4 x 4 mm ground-glass nodule in the posterior left upper lobe (series 4, image 52). Mild increased subpleural reticulation and scarring at the lung bases. Upper Abdomen: No acute abnormality. Musculoskeletal: No chest wall abnormality. No acute or significant osseous findings. IMPRESSION: 1. No acute intrathoracic process. 2. Left upper lobe 6 mm mixed ground-glass and solid nodule, new since the prior study. Additional new 4 mm ground-glass nodule in the left upper lobe. Usually, non-contrast chest CT at 3-6 months is recommended and if the nodules persist, subsequent management would be based upon the most suspicious nodule(s). However, given the patient's age, follow-up should be considered optional, especially if  the patient has significant comorbidities. 3.  Aortic atherosclerosis (ICD10-I70.0). Electronically Signed   By: Obie Dredge M.D.   On: 11/07/2022 16:56   DG Chest Portable 1 View  Result Date: 11/07/2022 CLINICAL DATA:  Cough for 1 week. EXAM: PORTABLE CHEST 1 VIEW COMPARISON:  Chest radiograph dated 10/06/2022 FINDINGS: The heart size and mediastinal contours are within normal limits. Vascular calcifications are seen in the aortic arch. Bibasilar interstitial opacities appear similar to prior exam, likely reflecting chronic lung disease. No definite pleural effusion or pneumothorax. Degenerative changes are seen in the spine. IMPRESSION: Bibasilar interstitial opacities appear similar to prior exam, likely reflecting chronic lung disease. Electronically Signed   By: Romona Curls M.D.   On: 11/07/2022 14:50    Procedures Procedures   Medications Ordered in ED Medications  enoxaparin (LOVENOX) injection 30 mg (has no administration in time range)   potassium chloride (KLOR-CON) packet 40 mEq (has no administration in time range)  acetaminophen (TYLENOL) tablet 650 mg (has no administration in time range)    Or  acetaminophen (TYLENOL) suppository 650 mg (has no administration in time range)  ondansetron (ZOFRAN) tablet 4 mg (has no administration in time range)    Or  ondansetron (ZOFRAN) injection 4 mg (has no administration in time range)  senna-docusate (Senokot-S) tablet 1 tablet (has no administration in time range)  guaiFENesin (MUCINEX) 12 hr tablet 600 mg (has no administration in time range)  ipratropium-albuterol (DUONEB) 0.5-2.5 (3) MG/3ML nebulizer solution 3 mL (3 mLs Nebulization Given 11/07/22 2038)  pantoprazole (PROTONIX) EC tablet 40 mg (has no administration in time range)  dextromethorphan (DELSYM) 30 MG/5ML liquid 30 mg (has no administration in time range)  sodium chloride 0.9 % bolus 1,000 mL (0 mLs Intravenous Stopped 11/07/22 1711)  iohexol (OMNIPAQUE) 300 MG/ML solution 75 mL (75 mLs Intravenous Contrast Given 11/07/22 1608)  cefTRIAXone (ROCEPHIN) 1 g in sodium chloride 0.9 % 100 mL IVPB (1 g Intravenous New Bag/Given 11/07/22 1831)    ED Course/ Medical Decision Making/ A&P                           Medical Decision Making Amount and/or Complexity of Data Reviewed Labs: ordered. Radiology: ordered.  Risk Prescription drug management.   This patient presents to the ED for concern of cough. Differential diagnosis includes viral URI, bronchitis, pneumonia, AMS, dehydration   Lab Tests:  I Ordered, and personally interpreted labs.  The pertinent results include:  CBC unremarkable, mild hyponatremia and hypokalemia, UA consistent with infection, respiratory viral panel negative, urine culture pending   Imaging Studies ordered:  I ordered imaging studies including chest xray, CT chest I independently visualized and interpreted imaging which showed chronic bilateral Insterstitial opacities, pulmonary  nodules noted, but no acute intrathoracic pathology I agree with the radiologist interpretation   Medicines ordered and prescription drug management:  I ordered medication including fluids, Rocephin for UTI  Reevaluation of the patient after these medicines showed that the patient improved I have reviewed the patients home medicines and have made adjustments as needed   Problem List / ED Course:  Patient presents to the ED for cough and generalized weakness. Daughter reports this has been worsening over the course of the last week or so. Denies fever, chest pain, AMS, or confusion, but weakness not improving. Patient becoming less ambulatory over the course of the week and not able to converse well with me in the ED. Workup initiated for potential  cause of cough/weakness. Labs largely unremarkable as no leukocytosis is noted and negative RVP. Chest xray negative for any acute pathology such as bronchitis or pneumonia. CT chest was also negative for any acute pathology but evidence of pulmonary nodules which are new compared to prior scans of chest. Spoke with Dr. Allena Katz, hospitalist, who will evaluate patient for admission and further management given tachypnea and UTI present. Advised patient and daughter of this plan and they are agreeable with admission for further evaluation.   Final Clinical Impression(s) / ED Diagnoses Final diagnoses:  Urinary tract infection without hematuria, site unspecified  Tachypnea  Lung nodule seen on imaging study    Rx / DC Orders ED Discharge Orders     None         Smitty Knudsen, PA-C 11/07/22 2040    Rolan Bucco, MD 11/07/22 2102

## 2022-11-07 NOTE — Hospital Course (Signed)
Ashley Willis is a 89 y.o. female with medical history significant for CAD, HLD, GERD, RA, stricture/stenosis of esophagus, chronic cough, dementia who is admitted with generalized weakness, acute on chronic cough, UTI.

## 2022-11-07 NOTE — ED Triage Notes (Signed)
Patient brought in by EMS due to cough. Per EMS report, patient has a history of pulmonary fibrosis and has a cough that has been getting worse over the past week. No other complaints per EMS.

## 2022-11-07 NOTE — H&P (Signed)
History and Physical    Ashley Willis RUE:[id number] DOB: 11-15-1926 DOA: 11/07/2022  PCP: Clovis Riley, L.August Saucer, MD  Patient coming from: Home  I have personally briefly reviewed patient's old medical records in [facility]  Chief Complaint: Cough, generalized weakness  HPI: Ashley Willis is a 89 y.o. female with medical history significant for CAD, HLD, GERD, RA, stricture/stenosis of esophagus, chronic cough, dementia who presented to the ED for evaluation of generalized weakness.  History is limited from patient due to dementia and is otherwise supplemented by EDP, chart review, and patient's daughter at bedside.  Patient lives in her own home, she has someone from her family staying with her at all times.  Daughter states that she is supposed to use walker when ambulating but she usually refuses and walks with family assistance.  She has a chronic productive cough.  Over the last week the cough has been more frequent with increased clear sputum production.  She has appeared to be short of breath intermittently.  She has had increased urinary frequency.  She has been treated for a recent vaginal yeast infection.  She does intermittently have choking episodes when eating concern for aspiration events.  Over the last 2 days she has been generally weaker than normal with slow shuffling gait.  ED Course  Labs/Imaging on admission: I have personally reviewed following labs and imaging studies.  Initial vitals showed BP 133/67, pulse 91, RR 20, temp 98.1 F, SpO2 97% on room air.  Labs show WBC 4.0, hemoglobin 12.9, platelets 135,000, sodium 134, potassium 3.3, bicarb 26, BUN 16, creatinine 0.92, serum glucose 138, LFTs within normal limits.  Urinalysis shows positive nitrites, moderate leukocytes, 0-5 RBCs, 21-50 WBCs, rare bacteria microscopy.  Urine culture in process.  SARS-CoV-2, influenza, RSV PCR are negative.  CT chest with contrast shows scarring at the lung bases otherwise no  acute intrathoracic process.  Left upper lobe pulmonary nodules noted.  Patient was given 1 L normal saline and IV ceftriaxone.  The hospitalist service was consulted to admit for further evaluation and management.  Review of Systems: All systems reviewed and are negative except as documented in history of present illness above.   Past Medical History:  Diagnosis Date   Anemia    CAD (coronary artery disease) 07/14/2006   ECHO - EF >55%; borderline concentric LVH; LA mildly dilated, mild tricuspid regurgitation   CAD (coronary artery disease) 08/27/2010   R/P MV - LV normal in size, fixed mid-basal inferior defect is seen, in absence of gated images not possible to distinguish diaphragmatic atenuation from nontransmural infarction; no significant ischemia detected   Calculus of gallbladder without mention of cholecystitis or obstruction    Diverticulosis of colon (without mention of hemorrhage)    Esophageal reflux    Hiatal hernia    Hyperlipemia    Hypertension    Insomnia, unspecified    Memory loss    Myocardial infarct (HCC) 1989   inferior wall MI - total occlusionof distal RCA beyond PDA branch, treated w/ medical therapy   Osteoporosis, unspecified    Other symptoms involving abdomen and pelvis(789.9)    Pancreatitis    Personal history of colonic polyps 03/03/2010   tubular adenomas   Rheumatoid arthritis(714.0)    Stricture and stenosis of esophagus    Vitamin B12 deficiency     Past Surgical History:  Procedure Laterality Date   ANGIOPLASTY     APPENDECTOMY     AXILLARY SURGERY     BREAST BIOPSY Right  05/2006   CHOLECYSTECTOMY      Social History:  reports that she has never smoked. She has never used smokeless tobacco. She reports that she does not drink alcohol and does not use drugs.  Allergies  Allergen Reactions   Codeine     Family History  Problem Relation Age of Onset   Diabetes Mother    Colon cancer Neg Hx      Prior to Admission medications    Medication Sig Start Date End Date Taking? Authorizing Provider  aspirin EC 81 MG tablet Take 81 mg by mouth daily. Swallow whole.    [provider]  atorvastatin (LIPITOR) 40 MG tablet TAKE 1 TABLET (40 MG TOTAL) BY MOUTH DAILY. 07/21/12   Norins, Rosalyn Gess, MD  Calcium Carbonate-Vitamin D 600-400 MG-UNIT tablet Take 1 tablet by mouth daily.      [provider]  cholecalciferol (VITAMIN D) 1000 UNITS tablet Take 2,000 Units by mouth daily.    [provider]  dextromethorphan 15 MG/5ML syrup Take 10 mLs (30 mg total) by mouth 4 (four) times daily as needed for cough. 10/31/21   Omar Person, MD  fluticasone (FLONASE) 50 MCG/ACT nasal spray Place 1 spray into both nostrils daily. 10/31/21   Omar Person, MD  gabapentin (NEURONTIN) 100 MG capsule Take 100 mg by mouth daily.    [provider]  pantoprazole (PROTONIX) 40 MG tablet Take 40 mg by mouth daily.    [provider]    Physical Exam: Vitals:   11/07/22 1425 11/07/22 1530 11/07/22 1545 11/07/22 1822  BP: 133/67 (!) 119/55 (!) 118/57 (!) 118/57  Pulse: 91 75 76 80  Resp: 20 (!) 26 (!) 25 20  Temp: 98.1 F (36.7 C)   98.1 F (36.7 C)  TempSrc: Oral     SpO2: 97% 95% 95% 96%  Weight:      Height:       Exam limited due to dementia. Constitutional: When sitting up in bed, NAD, flat affect Eyes: EOMI, lids and conjunctivae normal ENMT: Mucous membranes are moist. Posterior pharynx clear of any exudate or lesions.Normal dentition.  Neck: normal, supple, no masses. Respiratory: Faint expiratory wheezing lower lung fields. Normal respiratory effort. No accessory muscle use.  Cardiovascular: Regular rate and rhythm, no murmurs / rubs / gallops. No extremity edema. 2+ pedal pulses. Abdomen: no tenderness, no masses palpated.  Musculoskeletal: no clubbing / cyanosis. No joint deformity upper and lower extremities. Good ROM, no contractures. Normal muscle tone.  Skin: no rashes,  lesions, ulcers. No induration Neurologic: Sensation intact. Strength equal bilaterally. Psychiatric: Alert and oriented to self, recognizes daughter at bedside.  Flat affect.  EKG: Personally reviewed. Sinus rhythm, rate 77, diffuse T wave changes, similar to prior.  Assessment/Plan Principal Problem:   UTI (urinary tract infection) Active Problems:   CORONARY ARTERY DISEASE   Dementia without behavioral disturbance (HCC)   Generalized weakness   Chronic cough   Left upper lobe pulmonary nodule   Ashley Willis is a 89 y.o. female with medical history significant for CAD, HLD, GERD, RA, stricture/stenosis of esophagus, chronic cough, dementia who is admitted with generalized weakness, acute on chronic cough, UTI.  Assessment and Plan: Urinary tract infection: Abnormal UA with suspicion for UTI contributing to recent functional decline.  Increased urinary frequency at home per family otherwise patient not reliably describing symptoms due to dementia.  She was recently treated for vulvar candidiasis. -Received IV ceftriaxone 1 g -Follow urine culture  Acute on chronic cough: Increased cough frequency with clear sputum production over the last week.  Multifactorial secondary to likely chronic bronchitis, bilateral lower lobe scarring, GERD with suspected intermittent aspiration events.  No evidence of consolidation or effusion on CT chest.  COVID, flu, RSV PCR negative. -SLP eval -Start scheduled DuoNeb -Continue Protonix, Mucinex -Supplemental O2 as needed  Generalized weakness: Lives at home with 24/7 family care.  Requires partial assistance with ambulation at baseline per family.  Weaker the last 2 days with slow shuffling gait. -PT/OT eval  Hypokalemia: Supplement and repeat labs in a.m.  CAD/hyperlipidemia: Stable, does not appear to be on aspirin or statin anymore on review of PCP clinic documentation.  Dementia: Delirium precautions.  Left upper lobe pulmonary  nodules: Incidental finding of 6 mm and 4 mm pulmonary nodules of the left lower lobe noted on chest CT.  Findings discussed with patient's daughter on admission and recommended further discussion with PCP regarding follow-up surveillance.  DVT prophylaxis: enoxaparin (LOVENOX) injection 30 mg Start: 11/07/22 2200 Code Status: DNR, discussed with patient's daughter on admission Family Communication: Daughter at bedside Disposition Plan: From home, dispo pending clinical progress Consults called: None Severity of Illness: The appropriate patient status for this patient is OBSERVATION. Observation status is judged to be reasonable and necessary in order to provide the required intensity of service to ensure the patient's safety. The patient's presenting symptoms, physical exam findings, and initial radiographic and laboratory data in the context of their medical condition is felt to place them at decreased risk for further clinical deterioration. Furthermore, it is anticipated that the patient will be medically stable for discharge from the hospital within 2 midnights of admission.   Darreld Mclean MD [facility]  If 7PM-7AM, please contact night-coverage [email or web address]  11/07/2022, 7:32 PM

## 2022-11-08 DIAGNOSIS — N390 Urinary tract infection, site not specified: Secondary | ICD-10-CM | POA: Diagnosis not present

## 2022-11-08 DIAGNOSIS — N3000 Acute cystitis without hematuria: Secondary | ICD-10-CM

## 2022-11-08 LAB — CBC
HCT: 37.9 % (ref 36.0–46.0)
Hemoglobin: 12 g/dL (ref 12.0–15.0)
MCH: 27.8 pg (ref 26.0–34.0)
MCHC: 31.7 g/dL (ref 30.0–36.0)
MCV: 87.7 fL (ref 80.0–100.0)
Platelets: 126 K/uL — ABNORMAL LOW (ref 150–400)
RBC: 4.32 MIL/uL (ref 3.87–5.11)
RDW: 12.8 % (ref 11.5–15.5)
WBC: 4.1 K/uL (ref 4.0–10.5)
nRBC: 0 % (ref 0.0–0.2)

## 2022-11-08 LAB — BASIC METABOLIC PANEL
Anion gap: 10 (ref 5–15)
BUN: 12 mg/dL (ref 8–23)
CO2: 22 mmol/L (ref 22–32)
Calcium: 8.4 mg/dL — ABNORMAL LOW (ref 8.9–10.3)
Chloride: 105 mmol/L (ref 98–111)
Creatinine, Ser: 0.85 mg/dL (ref 0.44–1.00)
GFR, Estimated: >60 mL/min (ref >60)
Glucose, Bld: 87 mg/dL (ref 70–99)
Potassium: 3.5 mmol/L (ref 3.5–5.1)
Sodium: 137 mmol/L (ref 135–145)

## 2022-11-08 MED ORDER — FOSFOMYCIN TROMETHAMINE 3 G PO PACK
3.0000 g | PACK | Freq: Once | ORAL | Status: AC
  Administered 2022-11-08: 3 g via ORAL
  Filled 2022-11-08: qty 3

## 2022-11-08 MED ORDER — IPRATROPIUM-ALBUTEROL 0.5-2.5 (3) MG/3ML IN SOLN
3.0000 mL | Freq: Three times a day (TID) | RESPIRATORY_TRACT | Status: DC
  Administered 2022-11-08: 3 mL via RESPIRATORY_TRACT
  Filled 2022-11-08: qty 42

## 2022-11-08 NOTE — TOC Initial Note (Addendum)
Transition of Care [facility]) - Initial/Assessment Note    Patient Details  Name: Ashley Willis MRN: [id number] Date of Birth: December 03, 1926  Transition of Care [facility]) CM/SW Contact:    Ashley Prows, RN Phone Number: 11/08/2022, 1:26 PM  Clinical Narrative:                 [facility] consult for d/c planning; spoke w/ pt's dtr Ashley Willis and family in room; pt asleep; pt's dtr is pt's POC; she says pt is from home and she plans for pt to return at d/c' she says pt has dentures (upper/lower), walker, and shower stool; pt does not have glasses, HA, or home oxygen; [name] says pt has 24 hour care at home, along w/ PT arranged by pt's PCP, and aide; she declines TOC arranging recc services (PT, OT. Aide, and SW); she agrees to hospital bed and [facility]; spoke w/ Vaughan Basta at [place] and he says hospital bed will not be delivered until Tues; pt's dtr notified and would like BSC delivered at that time; she denies pt experiencing IPV, food insecurity, or difficulty paying utilities; she also says pt has transportation; no TOC needs.  Expected Discharge Plan: Home/Self Care Barriers to Discharge: No Barriers Identified   Patient Goals and CMS Choice Patient states their goals for this hospitalization and ongoing recovery are:: pt will return home per pt's dtr          Expected Discharge Plan and Services   Discharge Planning Services: CM Consult Post Acute Care Choice: Durable Medical Equipment [facility] Hospital Bed, and [facility]) Living arrangements for the past 2 months: Single Family Home Expected Discharge Date: 11/08/22               DME Arranged: Bedside commode, Hospital bed DME Agency: [employer] Date DME Agency Contacted: 11/08/22 Time DME Agency Contacted: [phone] Representative spoke with at DME Agency: Lelon Mast HH Arranged: Patient Refused HH          Prior Living Arrangements/Services Living arrangements for the past 2 months: Single Family Home Lives with:: Self Patient  language and need for interpreter reviewed:: Yes Do you feel safe going back to the place where you live?: Yes      Need for Family Participation in Patient Care: Yes (Comment) Care giver support system in place?: Yes (comment) Current home services: DME, Home PT, Homehealth aide (shower stool; HHPT and aide arranged per pt's dtr) Criminal Activity/Legal Involvement Pertinent to Current Situation/Hospitalization: No - Comment as needed  Activities of Daily Living      Permission Sought/Granted Permission sought to share information with : Case Manager Permission granted to share information with : Yes, Verbal Permission Granted  Share Information with NAME: Ashley Bunting, RN, CM     Permission granted to share info w Relationship: Ashley Willis (dtr) [phone]     Emotional Assessment Appearance:: Appears stated age Attitude/Demeanor/Rapport: Unable to Assess Affect (typically observed): Unable to Assess Orientation: :  (unable to assess) Alcohol / Substance Use: Not Applicable Psych Involvement: No (comment)  Admission diagnosis:  Tachypnea [R06.82] UTI (urinary tract infection) [N39.0] Lung nodule seen on imaging study [R91.1] Urinary tract infection without hematuria, site unspecified [N39.0] Patient Active Problem List   Diagnosis Date Noted   UTI (urinary tract infection) 11/07/2022   Dementia without behavioral disturbance (HCC) 11/07/2022   Generalized weakness 11/07/2022   Chronic cough 11/07/2022   Left upper lobe pulmonary nodule 11/07/2022   Routine health maintenance 08/01/2011   OSTEOPOROSIS 10/09/2009  VITAMIN B12 DEFICIENCY 02/25/2009   GALLSTONES 08/28/2008   MEMORY LOSS 08/28/2008   ANEMIA, IRON DEFICIENCY 08/27/2008   MI 08/27/2008   ESOPHAGEAL STRICTURE 08/27/2008   GERD 08/27/2008   HIATAL HERNIA 08/27/2008   DIVERTICULOSIS, COLON 08/27/2008   EARLY SATIETY 08/27/2008   HYPERLIPIDEMIA 05/13/2007   HYPERTENSION 05/13/2007   CORONARY ARTERY  DISEASE 05/13/2007   RHEUMATOID ARTHRITIS 05/13/2007   INSOMNIA 05/13/2007   PANCREATITIS, HX OF 05/13/2007   COLONIC POLYPS, HX OF 05/13/2007   POLYPECTOMY, HX OF 05/13/2007   TAH/BSO, HX OF 05/13/2007   PCP:  Ashley Willis, Ashley Saucer, MD Pharmacy:   [phone] [place], [place] - [address] [place] [address] Phone: [phone] Fax: [phone]  [facility] - [place], [place] - [address] [place] [address] Phone: [phone] Fax: [phone]     Social Determinants of Health (SDOH) Social History: SDOH Screenings   Food Insecurity: No Food Insecurity (11/08/2022)  Housing: Low Risk  (11/08/2022)  Transportation Needs: No Transportation Needs (11/08/2022)  Utilities: Not At Risk (11/08/2022)  Tobacco Use: Low Risk  (11/07/2022)   SDOH Interventions: Food Insecurity Interventions: Inpatient TOC Housing Interventions: Inpatient TOC Transportation Interventions: Inpatient TOC Utilities Interventions: Inpatient TOC   Readmission Risk Interventions     No data to display

## 2022-11-08 NOTE — Evaluation (Signed)
Physical Therapy Evaluation Patient Details Name: Ashley Willis MRN: [id number] DOB: 30-Jan-1927 Today's Date: 11/08/2022  History of Present Illness  Patient is a 89 year old female who presented with generalized weakness and productive cough on 5/25. Patient was found to have UTI, acute on chronic cough, generalized weakness and hypokalemia. PMH: dementia, left lower lobe pulmonary nodules.  Clinical Impression  Pt admitted with above diagnosis.  Pt currently with functional limitations due to the deficits listed below (see PT Problem List). Pt will benefit from acute skilled PT to increase their independence and safety with mobility to allow discharge.  Pt in recliner on arrival and assisted with a couple sit to stands and small marching in place.  Pt has been needing more physical assist for transfers per family especially after fall with right shoulder pain.  Pt is HOH and appears slow to process however when provided with time and step by step cues, pt initiated tasks and attempted to assist as able. Pt had just started HHPT prior to admission and family plans for pt to return home and continue with HHPT.        Recommendations for follow up therapy are one component of a multi-disciplinary discharge planning process, led by the attending physician.  Recommendations may be updated based on patient status, additional functional criteria and insurance authorization.  Follow Up Recommendations       Assistance Recommended at Discharge Intermittent Supervision/Assistance  Patient can return home with the following  A lot of help with walking and/or transfers;A lot of help with bathing/dressing/bathroom;Help with stairs or ramp for entrance;Assistance with cooking/housework;Direct supervision/assist for financial management;Assistance with feeding;Assist for transportation;Direct supervision/assist for medications management    Equipment Recommendations Wheelchair cushion (measurements  PT);Wheelchair (measurements PT);BSC/3in1  Recommendations for Other Services       Functional Status Assessment Patient has had a recent decline in their functional status and demonstrates the ability to make significant improvements in function in a reasonable and predictable amount of time.     Precautions / Restrictions Precautions Precautions: Fall Restrictions Weight Bearing Restrictions: No      Mobility  Bed Mobility               General bed mobility comments: pt in recliner    Transfers Overall transfer level: Needs assistance Equipment used: Rolling walker (2 wheels) Transfers: Sit to/from Stand Sit to Stand: Mod assist, +2 physical assistance           General transfer comment: multimodal cues for positioning and technique; requires step by step cues and increased time, slow to process; posterior lean upon standing but able to correct with time and cues; briefly able to lift bil LEs for small march in place; performed sit to stand and march x2    Ambulation/Gait                  Stairs            Wheelchair Mobility    Modified Rankin (Stroke Patients Only)       Balance Overall balance assessment: Needs assistance, History of Falls (recent fall in April)         Standing balance support: Bilateral upper extremity supported, Reliant on assistive device for balance Standing balance-Leahy Scale: Poor                               Pertinent Vitals/Pain Pain Assessment Pain Assessment: No/denies pain  Home Living Family/patient expects to be discharged to:: Private residence   Available Help at Discharge: Family;Available 24 hours/day Type of Home: House Home Access: Stairs to enter Entrance Stairs-Rails: Can reach both Entrance Stairs-Number of Steps: 4   Home Layout: One level Home Equipment: Agricultural consultant (2 wheels)      Prior Function Prior Level of Function : Needs assist  Cognitive Assist : ADLs  (cognitive)   ADLs (Cognitive): Step by step cues Physical Assist : Mobility (physical)     Mobility Comments: HHA from family for mobility ADLs Comments: wears gowns and house coat at home. family assists with all tasks.     Hand Dominance        Extremity/Trunk Assessment   Upper Extremity Assessment Upper Extremity Assessment: RUE deficits/detail;LUE deficits/detail RUE Deficits / Details: hx of fall, per xray 4/19 no fracture however noted high riding humeral head suggesting rotator cuff pathology; pt unable to perform shoulder movement above 45* LUE Deficits / Details: shoulder AROM limited to approx 90*    Lower Extremity Assessment Lower Extremity Assessment: Generalized weakness    Cervical / Trunk Assessment Cervical / Trunk Assessment: Kyphotic  Communication   Communication: No difficulties  Cognition Arousal/Alertness: Awake/alert Behavior During Therapy: Flat affect Overall Cognitive Status: History of cognitive impairments - at baseline                                 General Comments: family in room during session able to provide PLOF. patient noted to have delay and appears both HOH and slow to process        General Comments      Exercises     Assessment/Plan    PT Assessment Patient needs continued PT services  PT Problem List Decreased strength;Decreased range of motion;Decreased balance;Decreased mobility;Decreased knowledge of use of DME;Decreased cognition;Decreased coordination;Decreased skin integrity;Decreased activity tolerance       PT Treatment Interventions DME instruction;Gait training;Therapeutic exercise;Balance training;Functional mobility training;Patient/family education;Therapeutic activities    PT Goals (Current goals can be found in the Care Plan section)  Acute Rehab PT Goals PT Goal Formulation: With patient/family Time For Goal Achievement: 11/22/22 Potential to Achieve Goals: Fair    Frequency Min  1X/week     Co-evaluation               AM-PAC PT "6 Clicks" Mobility  Outcome Measure Help needed turning from your back to your side while in a flat bed without using bedrails?: A Lot Help needed moving from lying on your back to sitting on the side of a flat bed without using bedrails?: A Lot Help needed moving to and from a bed to a chair (including a wheelchair)?: A Lot Help needed standing up from a chair using your arms (e.g., wheelchair or bedside chair)?: A Lot Help needed to walk in hospital room?: Total Help needed climbing 3-5 steps with a railing? : Total 6 Click Score: 10    End of Session Equipment Utilized During Treatment: Gait belt Activity Tolerance: Patient tolerated treatment well Patient left: in chair;with call bell/phone within reach;with family/visitor present Nurse Communication: Mobility status PT Visit Diagnosis: Unsteadiness on feet (R26.81);Muscle weakness (generalized) (M62.81)    Time: 8657-8469 PT Time Calculation (min) (ACUTE ONLY): 28 min   Charges:   PT Evaluation $PT Eval Low Complexity: 1 Low PT Treatments $Therapeutic Activity: 8-22 mins      Thomasene Mohair PT, DPT Physical Therapist Acute  Rehabilitation Services Office: [phone]   Carlyon Prows 11/08/2022, 1:27 PM

## 2022-11-08 NOTE — Evaluation (Signed)
Occupational Therapy Evaluation Patient Details Name: Ashley Willis MRN: [id number] DOB: 1927-04-21 Today's Date: 11/08/2022   History of Present Illness Patient is a 89 year old female who presented with generalized weakness and productive cough on 5/25. Patient was found to have UTI, acute on chronic cough, generalized weakness and hypokalemia. PMH: dementia, left lower lobe pulmonary nodules.   Clinical Impression   Patient is a 89 year old female who was admitted for above. Patient was living at home with family support 24/7 with HHA for functional mobility. Patient  currently has decreased functional activity tolerance impacting ability to get off commode with STEDY used to get patient out of bathroom.  Patient would need 24/7 caregiver physical support in next level of care to be successful. Patient would continue to benefit from skilled OT services at this time while admitted and after d/c to address noted deficits in order to improve overall safety and independence in ADLs.       Recommendations for follow up therapy are one component of a multi-disciplinary discharge planning process, led by the attending physician.  Recommendations may be updated based on patient status, additional functional criteria and insurance authorization.   Assistance Recommended at Discharge Frequent or constant Supervision/Assistance  Patient can return home with the following A lot of help with bathing/dressing/bathroom;Assistance with cooking/housework;Direct supervision/assist for medications management;Assist for transportation;Help with stairs or ramp for entrance;Direct supervision/assist for financial management;Assistance with feeding;A lot of help with walking and/or transfers    Functional Status Assessment  Patient has had a recent decline in their functional status and demonstrates the ability to make significant improvements in function in a reasonable and predictable amount of time.  Equipment  Recommendations  BSC/3in1       Precautions / Restrictions Precautions Precautions: Fall Restrictions Weight Bearing Restrictions: No      Mobility Bed Mobility               General bed mobility comments: patient was OOB at start of session and transitioned to recliner at end of session.                   ADL either performed or assessed with clinical judgement   ADL Overall ADL's : Needs assistance/impaired Eating/Feeding: Minimal assistance Eating/Feeding Details (indicate cue type and reason): patient needed set up and increased cues to intiation of self feeding tasks. patient kept reporting " i didnt know i was supposed to start" when cued to eat. son reported patient has been having a harder time overall in the last 4 weeks. Grooming: Minimal assistance;Sitting   Upper Body Bathing: Moderate assistance;Sitting   Lower Body Bathing: Total assistance;Sit to/from stand;Sitting/lateral leans   Upper Body Dressing : Moderate assistance;Sitting   Lower Body Dressing: Sit to/from stand;Sitting/lateral leans;Total assistance     Toilet Transfer Details (indicate cue type and reason): patient was in bathroom with son and RW at start of session. NT reported that family got patient into bathroom and patient was unable to stand up off commode or take steps to get back into room. STEDY was obstained and patient was min A with +2 for safety to use STEDY to transfer from commode in bathroom to recliner in room as patients breakfast had arrived. patient needed encouragement to sit up for breakfast v.s. getting back into bed. family in room able to assist with encouragement.                  Pertinent Vitals/Pain Pain Assessment Pain Assessment:  No/denies pain     Hand Dominance     Extremity/Trunk Assessment Upper Extremity Assessment Upper Extremity Assessment: Difficult to assess due to impaired cognition   Lower Extremity Assessment Lower Extremity  Assessment: Defer to PT evaluation   Cervical / Trunk Assessment Cervical / Trunk Assessment: Kyphotic   Communication Communication Communication: No difficulties   Cognition Arousal/Alertness: Awake/alert Behavior During Therapy: Flat affect Overall Cognitive Status: History of cognitive impairments - at baseline                 General Comments: patients youngest son was present in room during session able to provide PLOF. patient noted to have delay in answers unclear if hearing is issue or if cognitive.                Home Living Family/patient expects to be discharged to:: Private residence   Available Help at Discharge: Family;Available 24 hours/day Type of Home: House Home Access: Stairs to enter [employer] of Steps: 4 Entrance Stairs-Rails: Can reach both Home Layout: One level               Home Equipment: Agricultural consultant (2 wheels)          Prior Functioning/Environment Prior Level of Function : Needs assist  Cognitive Assist : ADLs (cognitive)   ADLs (Cognitive): Step by step cues       Mobility Comments: HHA from family for mobility ADLs Comments: wears gowns and house coat at home. family assists with all tasks.        OT Problem List: Decreased activity tolerance;Impaired balance (sitting and/or standing);Decreased coordination;Decreased safety awareness;Decreased knowledge of precautions      OT Treatment/Interventions: Self-care/ADL training;Energy conservation;Therapeutic exercise;DME and/or AE instruction;Therapeutic activities;Patient/family education;Balance training    OT Goals(Current goals can be found in the care plan section) Acute Rehab OT Goals Patient Stated Goal: none stated OT Goal Formulation: With patient/family Time For Goal Achievement: 11/22/22 Potential to Achieve Goals: Fair  OT Frequency: Min 1X/week       AM-PAC OT "6 Clicks" Daily Activity     Outcome Measure Help from another person eating  meals?: A Little Help from another person taking care of personal grooming?: A Little Help from another person toileting, which includes using toliet, bedpan, or urinal?: A Lot Help from another person bathing (including washing, rinsing, drying)?: A Lot Help from another person to put on and taking off regular upper body clothing?: A Little Help from another person to put on and taking off regular lower body clothing?: A Lot 6 Click Score: 15   End of Session Equipment Utilized During Treatment: Gait belt;Other (comment) (STEDY) Nurse Communication: Other (comment) (patients position in recliner with family in room)  Activity Tolerance: Patient limited by fatigue Patient left: in chair;with call bell/phone within reach;with family/visitor present  OT Visit Diagnosis: Unsteadiness on feet (R26.81);Other abnormalities of gait and mobility (R26.89);History of falling (Z91.81)                Time: 1610-9604 OT Time Calculation (min): 19 min Charges:  OT General Charges $OT Visit: 1 Visit OT Evaluation $OT Eval Low Complexity: 1 Low  Suren Payne OTR/L, MS Acute Rehabilitation Department Office# [phone]   Selinda Flavin 11/08/2022, 10:27 AM

## 2022-11-08 NOTE — Discharge Summary (Signed)
Physician Discharge Summary   Patient: Ashley Willis MRN: [id number] DOB: [birth year]  Admit date:     11/07/2022  Discharge date: 11/08/22  Discharge Physician: Tyrone Nine   PCP: Clovis Riley, L.August Saucer, MD   Recommendations at discharge:  Follow up with PCP in 1-2 weeks. Consider follow up of pulmonary nodules (see details below)  Discharge Diagnoses: Principal Problem:   UTI (urinary tract infection) Active Problems:   CORONARY ARTERY DISEASE   Dementia without behavioral disturbance (HCC)   Generalized weakness   Chronic cough   Left upper lobe pulmonary nodule  Hospital Course: Ashley Willis is a 89 y.o. female with medical history significant for CAD, HLD, GERD, RA, stricture/stenosis of esophagus, chronic cough, dementia who is admitted with generalized weakness, acute on chronic cough, UTI.  Work up, as detailed below, showed no acute intrathoracic process. The patient's strength improved with PT and antibiotics. She is stable for discharge back to home environment with maximal home health and DME support.   Assessment and Plan: Urinary tract infection: Abnormal UA with suspicion for UTI contributing to recent functional decline.  Increased urinary frequency at home per family otherwise patient not reliably describing symptoms due to dementia.  She was recently treated for vulvar candidiasis. -Received IV ceftriaxone 1 g, then fosfomycin x1. Will follow up final results from urine culture (pending at discharge). No evidence of sepsis.    Acute on chronic cough: No hypoxia. Increased cough frequency with clear sputum production over the last week.  Multifactorial secondary to likely chronic bronchitis/RA-related ILD, bilateral lower lobe scarring, possible intermittent aspiration events.  No evidence of consolidation or effusion on CT chest.  COVID, flu, RSV PCR negative. -SLP eval > dysphagia diet, especially in light of esophageal stenosis. Hospital bed ordered to facilitate  raising HOB.  - Continue home medications   Generalized weakness: Lives at home with 24/7 family care.  Requires partial assistance with ambulation at baseline per family.  Weaker the last 2 days with slow shuffling gait. -PT/OT eval >> Order home health PT/OT and DME. Family at bedside comfortable taking patient back home.   Hypokalemia: Resolved.   CAD/hyperlipidemia: Stable, does not appear to be on aspirin or statin anymore on review of PCP clinic documentation.   Dementia: Delirium precautions. Abbreviate hospital stay to reduce risk of complications from hospital delirium. I do suspect some withdrawn delirium while here.    Left upper lobe pulmonary nodules: Incidental finding of 6 mm and 4 mm pulmonary nodules of the left lower lobe noted on chest CT.   - Findings discussed with patient's daughter on admission and recommended further discussion with PCP regarding follow-up surveillance.  Consultants: None Procedures performed: None  Disposition: Home Diet recommendation: Dysphagia DISCHARGE MEDICATION: Allergies as of 11/08/2022       Reactions   Codeine         Medication List     STOP taking these medications    atorvastatin 40 MG tablet Commonly known as: LIPITOR       TAKE these medications    cetirizine 10 MG chewable tablet Commonly known as: ZYRTEC Chew 10 mg by mouth daily.   cholecalciferol 1000 units tablet Commonly known as: VITAMIN D Take 2,000 Units by mouth daily.   dextromethorphan 15 MG/5ML syrup Take 10 mLs (30 mg total) by mouth 4 (four) times daily as needed for cough.   fluticasone 50 MCG/ACT nasal spray Commonly known as: FLONASE Place 1 spray into both nostrils daily.   ipratropium-albuterol 0.5-2.5 (3)  MG/3ML Soln Commonly known as: DUONEB Inhale 3 mLs into the lungs every 6 (six) hours as needed (For shortness of breath).               Durable Medical Equipment  (From admission, onward)           Start      Ordered   11/08/22 1216  For home use only DME Bedside commode  Once       Question Answer Comment  Patient needs a bedside commode to treat with the following condition Gait instability   Patient needs a bedside commode to treat with the following condition Muscular deconditioning      11/08/22 1216   11/08/22 1215  For home use only DME Hospital bed  Once       Question Answer Comment  Length of Need 6 Months   Patient has (list medical condition): dementia, interstitial lung disease, debility, CAD, HLD, GERD, RA, esophageal stenosis with stricture   The above medical condition requires: Patient requires the ability to reposition immediately   Bed type Semi-electric      11/08/22 1216            Follow-up Information     Mitchell, L.August Saucer, MD Follow up.   Specialty: Family Medicine Contact information: [address] [place] [address] [phone]                Discharge Exam: Ceasar Mons Weights   11/07/22 1424  Weight: 47.4 kg  BP (!) 118/53 (BP Location: Left Arm)   Pulse 65   Temp 97.7 F (36.5 C) (Oral)   Resp 16   Ht 5\' 2"  (1.575 m)   Wt 47.4 kg   SpO2 94%   BMI 19.11 kg/m   Elderly frail female in no distress in bedside chair Limited R shoulder ROM No wheezes or crackles, nonlabored, normal rate on room air RRR, no MRG, no significant edema, no JVD Alert, not oriented, no focal deficits.  Condition at discharge: stable  The results of significant diagnostics from this hospitalization (including imaging, microbiology, ancillary and laboratory) are listed below for reference.   Imaging Studies: CT Chest W Contrast  Result Date: 11/07/2022 CLINICAL DATA:  Worsening cough over the past week. EXAM: CT CHEST WITH CONTRAST TECHNIQUE: Multidetector CT imaging of the chest was performed during intravenous contrast administration. RADIATION DOSE REDUCTION: This exam was performed according to the departmental dose-optimization program which  includes automated exposure control, adjustment of the mA and/or kV according to patient size and/or use of iterative reconstruction technique. CONTRAST:  75mL OMNIPAQUE IOHEXOL 300 MG/ML  SOLN COMPARISON:  Chest x-ray from same day. CT chest dated Nov 06, 2004. FINDINGS: Cardiovascular: No significant vascular findings. Normal heart size. No pericardial effusion. No thoracic aortic aneurysm or dissection. Coronary, aortic arch, and branch vessel atherosclerotic vascular disease. No central pulmonary embolism. Mediastinum/Nodes: No enlarged mediastinal, hilar, or axillary lymph nodes. Thyroid gland, trachea, and esophagus demonstrate no significant findings. Lungs/Pleura: No focal consolidation, pleural effusion, or pneumothorax. 6 x 5 mm mixed ground-glass and solid nodule in the left upper lobe, the solid component measuring 3 mm (series 4, image 61), new since the prior study. Additional 4 x 4 mm ground-glass nodule in the posterior left upper lobe (series 4, image 52). Mild increased subpleural reticulation and scarring at the lung bases. Upper Abdomen: No acute abnormality. Musculoskeletal: No chest wall abnormality. No acute or significant osseous findings. IMPRESSION: 1. No acute intrathoracic process.  2. Left upper lobe 6 mm mixed ground-glass and solid nodule, new since the prior study. Additional new 4 mm ground-glass nodule in the left upper lobe. Usually, non-contrast chest CT at 3-6 months is recommended and if the nodules persist, subsequent management would be based upon the most suspicious nodule(s). However, given the patient's age, follow-up should be considered optional, especially if the patient has significant comorbidities. 3.  Aortic atherosclerosis (ICD10-I70.0). Electronically Signed   By: Obie Dredge M.D.   On: 11/07/2022 16:56   DG Chest Portable 1 View  Result Date: 11/07/2022 CLINICAL DATA:  Cough for 1 week. EXAM: PORTABLE CHEST 1 VIEW COMPARISON:  Chest radiograph dated  10/06/2022 FINDINGS: The heart size and mediastinal contours are within normal limits. Vascular calcifications are seen in the aortic arch. Bibasilar interstitial opacities appear similar to prior exam, likely reflecting chronic lung disease. No definite pleural effusion or pneumothorax. Degenerative changes are seen in the spine. IMPRESSION: Bibasilar interstitial opacities appear similar to prior exam, likely reflecting chronic lung disease. Electronically Signed   By: Romona Curls M.D.   On: 11/07/2022 14:50    Microbiology: Results for orders placed or performed during the hospital encounter of 11/07/22  Resp panel by RT-PCR (RSV, Flu A&B, Covid) Anterior Nasal Swab     Status: None   Collection Time: 11/07/22  2:46 PM   Specimen: Anterior Nasal Swab  Result Value Ref Range Status   SARS Coronavirus 2 by RT PCR NEGATIVE NEGATIVE Final    Comment: (NOTE) SARS-CoV-2 target nucleic acids are NOT DETECTED.  The SARS-CoV-2 RNA is generally detectable in upper respiratory specimens during the acute phase of infection. The lowest concentration of SARS-CoV-2 viral copies this assay can detect is 138 copies/mL. A negative result does not preclude SARS-Cov-2 infection and should not be used as the sole basis for treatment or other patient management decisions. A negative result may occur with  improper specimen collection/handling, submission of specimen other than nasopharyngeal swab, presence of viral mutation(s) within the areas targeted by this assay, and inadequate number of viral copies(<138 copies/mL). A negative result must be combined with clinical observations, patient history, and epidemiological information. The expected result is Negative.  Fact Sheet for Patients:  BloggerCourse.com  Fact Sheet for Healthcare Providers:  SeriousBroker.it  This test is no t yet approved or cleared by the [place] FDA and  has been  authorized for detection and/or diagnosis of SARS-CoV-2 by FDA under an Emergency Use Authorization (EUA). This EUA will remain  in effect (meaning this test can be used) for the duration of the COVID-19 declaration under Section 564(b)(1) of the Act, 21 U.S.C.section 360bbb-3(b)(1), unless the authorization is terminated  or revoked sooner.       Influenza A by PCR NEGATIVE NEGATIVE Final   Influenza B by PCR NEGATIVE NEGATIVE Final    Comment: (NOTE) The Xpert Xpress SARS-CoV-2/FLU/RSV plus assay is intended as an aid in the diagnosis of influenza from Nasopharyngeal swab specimens and should not be used as a sole basis for treatment. Nasal washings and aspirates are unacceptable for Xpert Xpress SARS-CoV-2/FLU/RSV testing.  Fact Sheet for Patients: BloggerCourse.com  Fact Sheet for Healthcare Providers: SeriousBroker.it  This test is not yet approved or cleared by the [place] FDA and has been authorized for detection and/or diagnosis of SARS-CoV-2 by FDA under an Emergency Use Authorization (EUA). This EUA will remain in effect (meaning this test can be used) for the duration of the COVID-19 declaration under Section 564(b)(1)  of the Act, 21 U.S.C. section 360bbb-3(b)(1), unless the authorization is terminated or revoked.     Resp Syncytial Virus by PCR NEGATIVE NEGATIVE Final    Comment: (NOTE) Fact Sheet for Patients: BloggerCourse.com  Fact Sheet for Healthcare Providers: SeriousBroker.it  This test is not yet approved or cleared by the [place] FDA and has been authorized for detection and/or diagnosis of SARS-CoV-2 by FDA under an Emergency Use Authorization (EUA). This EUA will remain in effect (meaning this test can be used) for the duration of the COVID-19 declaration under Section 564(b)(1) of the Act, 21 U.S.C. section 360bbb-3(b)(1), unless the  authorization is terminated or revoked.  Performed at [facility], [address] [place], [place] [address]     Labs: CBC: Recent Labs  Lab 11/07/22 1446 11/08/22 0610  WBC 4.0 4.1  NEUTROABS 1.5*  --   HGB 12.9 12.0  HCT 41.1 37.9  MCV 88.6 87.7  PLT 135* 126*   Basic Metabolic Panel: Recent Labs  Lab 11/07/22 1446 11/08/22 0610  NA 134* 137  K 3.3* 3.5  CL 100 105  CO2 26 22  GLUCOSE 138* 87  BUN 16 12  CREATININE 0.92 0.85  CALCIUM 8.8* 8.4*   Liver Function Tests: Recent Labs  Lab 11/07/22 1446  AST 32  ALT 20  ALKPHOS 61  BILITOT 0.7  PROT 8.8*  ALBUMIN 3.7   CBG: No results for input(s): "GLUCAP" in the last 168 hours.  Discharge time spent: greater than 30 minutes.  Signed: Tyrone Nine, MD [facility] 11/08/2022

## 2022-11-08 NOTE — Progress Notes (Signed)
    Durable Medical Equipment  (From admission, onward)           Start     Ordered   11/08/22 1216  For home use only DME Bedside commode  Once       Question Answer Comment  Patient needs a bedside commode to treat with the following condition Gait instability   Patient needs a bedside commode to treat with the following condition Muscular deconditioning      11/08/22 1216   11/08/22 1215  For home use only DME Hospital bed  Once       Question Answer Comment  Length of Need 6 Months   Patient has (list medical condition): dementia, interstitial lung disease, debility, CAD, HLD, GERD, RA, esophageal stenosis with stricture   The above medical condition requires: Patient requires the ability to reposition immediately   Bed type Semi-electric      11/08/22 1216

## 2022-11-09 LAB — URINE CULTURE
Culture: >=100000 — AB
Special Requests: NORMAL

## 2022-11-10 ENCOUNTER — Ambulatory Visit: Payer: Medicare Other | Admitting: Podiatry

## 2022-11-12 ENCOUNTER — Ambulatory Visit: Payer: Medicare Other | Admitting: Podiatry

## 2022-11-13 DIAGNOSIS — M25511 Pain in right shoulder: Secondary | ICD-10-CM | POA: Diagnosis not present

## 2022-11-13 DIAGNOSIS — I700 Atherosclerosis of aorta: Secondary | ICD-10-CM | POA: Diagnosis not present

## 2022-11-13 DIAGNOSIS — M069 Rheumatoid arthritis, unspecified: Secondary | ICD-10-CM | POA: Diagnosis not present

## 2022-11-13 DIAGNOSIS — F0280 Dementia in other diseases classified elsewhere without behavioral disturbance: Secondary | ICD-10-CM | POA: Diagnosis not present

## 2022-11-13 DIAGNOSIS — E538 Deficiency of other specified B group vitamins: Secondary | ICD-10-CM | POA: Diagnosis not present

## 2022-11-13 DIAGNOSIS — I10 Essential (primary) hypertension: Secondary | ICD-10-CM | POA: Diagnosis not present

## 2022-11-17 DIAGNOSIS — I10 Essential (primary) hypertension: Secondary | ICD-10-CM | POA: Diagnosis not present

## 2022-11-17 DIAGNOSIS — F0280 Dementia in other diseases classified elsewhere without behavioral disturbance: Secondary | ICD-10-CM | POA: Diagnosis not present

## 2022-11-17 DIAGNOSIS — M069 Rheumatoid arthritis, unspecified: Secondary | ICD-10-CM | POA: Diagnosis not present

## 2022-11-17 DIAGNOSIS — M25511 Pain in right shoulder: Secondary | ICD-10-CM | POA: Diagnosis not present

## 2022-11-17 DIAGNOSIS — I700 Atherosclerosis of aorta: Secondary | ICD-10-CM | POA: Diagnosis not present

## 2022-11-17 DIAGNOSIS — E538 Deficiency of other specified B group vitamins: Secondary | ICD-10-CM | POA: Diagnosis not present

## 2022-11-20 DIAGNOSIS — I700 Atherosclerosis of aorta: Secondary | ICD-10-CM | POA: Diagnosis not present

## 2022-11-20 DIAGNOSIS — E538 Deficiency of other specified B group vitamins: Secondary | ICD-10-CM | POA: Diagnosis not present

## 2022-11-20 DIAGNOSIS — F0280 Dementia in other diseases classified elsewhere without behavioral disturbance: Secondary | ICD-10-CM | POA: Diagnosis not present

## 2022-11-20 DIAGNOSIS — M069 Rheumatoid arthritis, unspecified: Secondary | ICD-10-CM | POA: Diagnosis not present

## 2022-11-20 DIAGNOSIS — M25511 Pain in right shoulder: Secondary | ICD-10-CM | POA: Diagnosis not present

## 2022-11-20 DIAGNOSIS — I10 Essential (primary) hypertension: Secondary | ICD-10-CM | POA: Diagnosis not present

## 2022-11-24 DIAGNOSIS — I700 Atherosclerosis of aorta: Secondary | ICD-10-CM | POA: Diagnosis not present

## 2022-11-24 DIAGNOSIS — F0280 Dementia in other diseases classified elsewhere without behavioral disturbance: Secondary | ICD-10-CM | POA: Diagnosis not present

## 2022-11-24 DIAGNOSIS — M25511 Pain in right shoulder: Secondary | ICD-10-CM | POA: Diagnosis not present

## 2022-11-24 DIAGNOSIS — M069 Rheumatoid arthritis, unspecified: Secondary | ICD-10-CM | POA: Diagnosis not present

## 2022-11-24 DIAGNOSIS — E538 Deficiency of other specified B group vitamins: Secondary | ICD-10-CM | POA: Diagnosis not present

## 2022-11-24 DIAGNOSIS — I10 Essential (primary) hypertension: Secondary | ICD-10-CM | POA: Diagnosis not present

## 2022-11-26 DIAGNOSIS — I700 Atherosclerosis of aorta: Secondary | ICD-10-CM | POA: Diagnosis not present

## 2022-11-26 DIAGNOSIS — N390 Urinary tract infection, site not specified: Secondary | ICD-10-CM | POA: Diagnosis not present

## 2022-11-26 DIAGNOSIS — Z9181 History of falling: Secondary | ICD-10-CM | POA: Diagnosis not present

## 2022-11-26 DIAGNOSIS — I10 Essential (primary) hypertension: Secondary | ICD-10-CM | POA: Diagnosis not present

## 2022-11-26 DIAGNOSIS — I2510 Atherosclerotic heart disease of native coronary artery without angina pectoris: Secondary | ICD-10-CM | POA: Diagnosis not present

## 2022-11-26 DIAGNOSIS — F0280 Dementia in other diseases classified elsewhere without behavioral disturbance: Secondary | ICD-10-CM | POA: Diagnosis not present

## 2022-11-26 DIAGNOSIS — M25511 Pain in right shoulder: Secondary | ICD-10-CM | POA: Diagnosis not present

## 2022-11-26 DIAGNOSIS — I361 Nonrheumatic tricuspid (valve) insufficiency: Secondary | ICD-10-CM | POA: Diagnosis not present

## 2022-11-26 DIAGNOSIS — Z604 Social exclusion and rejection: Secondary | ICD-10-CM | POA: Diagnosis not present

## 2022-11-26 DIAGNOSIS — M069 Rheumatoid arthritis, unspecified: Secondary | ICD-10-CM | POA: Diagnosis not present

## 2022-11-26 DIAGNOSIS — R630 Anorexia: Secondary | ICD-10-CM | POA: Diagnosis not present

## 2022-11-26 DIAGNOSIS — E538 Deficiency of other specified B group vitamins: Secondary | ICD-10-CM | POA: Diagnosis not present

## 2022-11-26 DIAGNOSIS — B9689 Other specified bacterial agents as the cause of diseases classified elsewhere: Secondary | ICD-10-CM | POA: Diagnosis not present

## 2022-11-26 DIAGNOSIS — M810 Age-related osteoporosis without current pathological fracture: Secondary | ICD-10-CM | POA: Diagnosis not present

## 2022-11-26 DIAGNOSIS — Z556 Problems related to health literacy: Secondary | ICD-10-CM | POA: Diagnosis not present

## 2022-11-26 DIAGNOSIS — K859 Acute pancreatitis without necrosis or infection, unspecified: Secondary | ICD-10-CM | POA: Diagnosis not present

## 2022-11-26 DIAGNOSIS — D649 Anemia, unspecified: Secondary | ICD-10-CM | POA: Diagnosis not present

## 2022-11-27 DIAGNOSIS — I700 Atherosclerosis of aorta: Secondary | ICD-10-CM | POA: Diagnosis not present

## 2022-11-27 DIAGNOSIS — I10 Essential (primary) hypertension: Secondary | ICD-10-CM | POA: Diagnosis not present

## 2022-11-27 DIAGNOSIS — E538 Deficiency of other specified B group vitamins: Secondary | ICD-10-CM | POA: Diagnosis not present

## 2022-11-27 DIAGNOSIS — M25511 Pain in right shoulder: Secondary | ICD-10-CM | POA: Diagnosis not present

## 2022-11-27 DIAGNOSIS — M069 Rheumatoid arthritis, unspecified: Secondary | ICD-10-CM | POA: Diagnosis not present

## 2022-11-27 DIAGNOSIS — F0280 Dementia in other diseases classified elsewhere without behavioral disturbance: Secondary | ICD-10-CM | POA: Diagnosis not present

## 2022-11-30 ENCOUNTER — Ambulatory Visit: Payer: Medicare Other | Admitting: Podiatry

## 2022-12-01 DIAGNOSIS — N390 Urinary tract infection, site not specified: Secondary | ICD-10-CM | POA: Diagnosis not present

## 2022-12-01 DIAGNOSIS — E538 Deficiency of other specified B group vitamins: Secondary | ICD-10-CM | POA: Diagnosis not present

## 2022-12-01 DIAGNOSIS — M069 Rheumatoid arthritis, unspecified: Secondary | ICD-10-CM | POA: Diagnosis not present

## 2022-12-01 DIAGNOSIS — H6123 Impacted cerumen, bilateral: Secondary | ICD-10-CM | POA: Diagnosis not present

## 2022-12-01 DIAGNOSIS — F0280 Dementia in other diseases classified elsewhere without behavioral disturbance: Secondary | ICD-10-CM | POA: Diagnosis not present

## 2022-12-01 DIAGNOSIS — I700 Atherosclerosis of aorta: Secondary | ICD-10-CM | POA: Diagnosis not present

## 2022-12-01 DIAGNOSIS — I10 Essential (primary) hypertension: Secondary | ICD-10-CM | POA: Diagnosis not present

## 2022-12-01 DIAGNOSIS — M25511 Pain in right shoulder: Secondary | ICD-10-CM | POA: Diagnosis not present

## 2022-12-04 DIAGNOSIS — M069 Rheumatoid arthritis, unspecified: Secondary | ICD-10-CM | POA: Diagnosis not present

## 2022-12-04 DIAGNOSIS — I10 Essential (primary) hypertension: Secondary | ICD-10-CM | POA: Diagnosis not present

## 2022-12-04 DIAGNOSIS — E538 Deficiency of other specified B group vitamins: Secondary | ICD-10-CM | POA: Diagnosis not present

## 2022-12-04 DIAGNOSIS — F0280 Dementia in other diseases classified elsewhere without behavioral disturbance: Secondary | ICD-10-CM | POA: Diagnosis not present

## 2022-12-04 DIAGNOSIS — M25511 Pain in right shoulder: Secondary | ICD-10-CM | POA: Diagnosis not present

## 2022-12-04 DIAGNOSIS — I700 Atherosclerosis of aorta: Secondary | ICD-10-CM | POA: Diagnosis not present

## 2022-12-09 DIAGNOSIS — N390 Urinary tract infection, site not specified: Secondary | ICD-10-CM | POA: Diagnosis not present

## 2022-12-09 DIAGNOSIS — B3731 Acute candidiasis of vulva and vagina: Secondary | ICD-10-CM | POA: Diagnosis not present

## 2022-12-09 DIAGNOSIS — N958 Other specified menopausal and perimenopausal disorders: Secondary | ICD-10-CM | POA: Diagnosis not present

## 2022-12-11 DIAGNOSIS — M069 Rheumatoid arthritis, unspecified: Secondary | ICD-10-CM | POA: Diagnosis not present

## 2022-12-11 DIAGNOSIS — M25511 Pain in right shoulder: Secondary | ICD-10-CM | POA: Diagnosis not present

## 2022-12-11 DIAGNOSIS — I700 Atherosclerosis of aorta: Secondary | ICD-10-CM | POA: Diagnosis not present

## 2022-12-11 DIAGNOSIS — I10 Essential (primary) hypertension: Secondary | ICD-10-CM | POA: Diagnosis not present

## 2022-12-11 DIAGNOSIS — F0280 Dementia in other diseases classified elsewhere without behavioral disturbance: Secondary | ICD-10-CM | POA: Diagnosis not present

## 2022-12-11 DIAGNOSIS — E538 Deficiency of other specified B group vitamins: Secondary | ICD-10-CM | POA: Diagnosis not present

## 2022-12-14 ENCOUNTER — Ambulatory Visit (INDEPENDENT_AMBULATORY_CARE_PROVIDER_SITE_OTHER): Payer: Medicare Other | Admitting: Podiatry

## 2022-12-14 ENCOUNTER — Encounter: Payer: Self-pay | Admitting: Podiatry

## 2022-12-14 DIAGNOSIS — M79675 Pain in left toe(s): Secondary | ICD-10-CM | POA: Diagnosis not present

## 2022-12-14 DIAGNOSIS — M79674 Pain in right toe(s): Secondary | ICD-10-CM

## 2022-12-14 DIAGNOSIS — L03031 Cellulitis of right toe: Secondary | ICD-10-CM | POA: Diagnosis not present

## 2022-12-14 DIAGNOSIS — B351 Tinea unguium: Secondary | ICD-10-CM | POA: Diagnosis not present

## 2022-12-14 MED ORDER — DOXYCYCLINE HYCLATE 100 MG PO TABS: 100.0000 mg | ORAL_TABLET | Freq: Two times a day (BID) | ORAL | 0 refills | Status: DC

## 2022-12-14 MED ORDER — DOXYCYCLINE HYCLATE 100 MG PO TABS
100.0000 mg | ORAL_TABLET | Freq: Two times a day (BID) | ORAL | 0 refills | Status: AC
Start: 2022-12-14 — End: 2022-12-24

## 2022-12-14 NOTE — Progress Notes (Signed)
This patient returns to the office for evaluation and treatment of long thick painful nails .  This patient is unable to trim his own nails since the patient cannot reach the feet.  Patient says the nails are painful walking and wearing her shoes.  She returns for preventive foot care services.   She presents with a female guardian.  General Appearance  Alert, conversant and in no acute stress.  Vascular  Dorsalis pedis and posterior tibial  pulses are weakly  palpable  bilaterally.  Capillary return is within normal limits  bilaterally. Temperature is within normal limits  bilaterally.  Neurologic  Senn-Weinstein monofilament wire test diminished   bilaterally. Muscle power within normal limits bilaterally.  Nails Thick disfigured discolored nails with subungual debris  from hallux to fifth toes bilaterally. Paronychia noted medial border right hallux with necrotic tissue.  Orthopedic  No limitations of motion  feet .  No crepitus or effusions noted.  No bony pathology or digital deformities noted.  HAV  B/L.  Skin  normotropic skin with no porokeratosis noted bilaterally.  No signs of infections or ulcers noted.     Onychomycosis  Pain in toes right foot  Pain in toes left foot  Paronychia right hallux.  Debridement  of nails  1-5  B/L with a nail nipper.  Nails were then filed using a dremel tool with no incidents.   Upon debridement of nails there was significant necrotic tissue along the medial bordr right hallux.  After removal of necrotic tissue pus and redness was noted.  Debride necrotic tissue.  Prescribe antibiotics for infection.  Told him to peroxide and take antibiotics as prescribed.   Call the office if the infection worsens.RTC  4   months    Helane Gunther DPM

## 2022-12-15 DIAGNOSIS — I10 Essential (primary) hypertension: Secondary | ICD-10-CM | POA: Diagnosis not present

## 2022-12-15 DIAGNOSIS — M069 Rheumatoid arthritis, unspecified: Secondary | ICD-10-CM | POA: Diagnosis not present

## 2022-12-15 DIAGNOSIS — I700 Atherosclerosis of aorta: Secondary | ICD-10-CM | POA: Diagnosis not present

## 2022-12-15 DIAGNOSIS — F0280 Dementia in other diseases classified elsewhere without behavioral disturbance: Secondary | ICD-10-CM | POA: Diagnosis not present

## 2022-12-15 DIAGNOSIS — M25511 Pain in right shoulder: Secondary | ICD-10-CM | POA: Diagnosis not present

## 2022-12-15 DIAGNOSIS — E538 Deficiency of other specified B group vitamins: Secondary | ICD-10-CM | POA: Diagnosis not present

## 2022-12-21 DIAGNOSIS — L600 Ingrowing nail: Secondary | ICD-10-CM | POA: Diagnosis not present

## 2022-12-21 DIAGNOSIS — N390 Urinary tract infection, site not specified: Secondary | ICD-10-CM | POA: Diagnosis not present

## 2022-12-21 DIAGNOSIS — R630 Anorexia: Secondary | ICD-10-CM | POA: Diagnosis not present

## 2022-12-22 DIAGNOSIS — M069 Rheumatoid arthritis, unspecified: Secondary | ICD-10-CM | POA: Diagnosis not present

## 2022-12-22 DIAGNOSIS — M25511 Pain in right shoulder: Secondary | ICD-10-CM | POA: Diagnosis not present

## 2022-12-22 DIAGNOSIS — I10 Essential (primary) hypertension: Secondary | ICD-10-CM | POA: Diagnosis not present

## 2022-12-22 DIAGNOSIS — I700 Atherosclerosis of aorta: Secondary | ICD-10-CM | POA: Diagnosis not present

## 2022-12-22 DIAGNOSIS — F0280 Dementia in other diseases classified elsewhere without behavioral disturbance: Secondary | ICD-10-CM | POA: Diagnosis not present

## 2022-12-22 DIAGNOSIS — E538 Deficiency of other specified B group vitamins: Secondary | ICD-10-CM | POA: Diagnosis not present

## 2023-02-13 ENCOUNTER — Emergency Department (HOSPITAL_COMMUNITY): Payer: Medicare Other

## 2023-02-13 ENCOUNTER — Other Ambulatory Visit: Payer: Self-pay

## 2023-02-13 ENCOUNTER — Encounter (HOSPITAL_COMMUNITY): Payer: Self-pay | Admitting: Emergency Medicine

## 2023-02-13 ENCOUNTER — Emergency Department (HOSPITAL_COMMUNITY): Admission: EM | Admit: 2023-02-13 | Payer: Medicare Other | Source: Home / Self Care

## 2023-02-13 DIAGNOSIS — I10 Essential (primary) hypertension: Secondary | ICD-10-CM | POA: Diagnosis not present

## 2023-02-13 DIAGNOSIS — Y92009 Unspecified place in unspecified non-institutional (private) residence as the place of occurrence of the external cause: Secondary | ICD-10-CM | POA: Diagnosis not present

## 2023-02-13 DIAGNOSIS — W19XXXA Unspecified fall, initial encounter: Secondary | ICD-10-CM | POA: Diagnosis not present

## 2023-02-13 DIAGNOSIS — R609 Edema, unspecified: Secondary | ICD-10-CM | POA: Diagnosis not present

## 2023-02-13 DIAGNOSIS — R5383 Other fatigue: Secondary | ICD-10-CM | POA: Insufficient documentation

## 2023-02-13 DIAGNOSIS — W01190A Fall on same level from slipping, tripping and stumbling with subsequent striking against furniture, initial encounter: Secondary | ICD-10-CM | POA: Insufficient documentation

## 2023-02-13 DIAGNOSIS — R404 Transient alteration of awareness: Secondary | ICD-10-CM

## 2023-02-13 DIAGNOSIS — R4182 Altered mental status, unspecified: Secondary | ICD-10-CM | POA: Insufficient documentation

## 2023-02-13 DIAGNOSIS — I2510 Atherosclerotic heart disease of native coronary artery without angina pectoris: Secondary | ICD-10-CM | POA: Insufficient documentation

## 2023-02-13 DIAGNOSIS — S300XXA Contusion of lower back and pelvis, initial encounter: Secondary | ICD-10-CM | POA: Insufficient documentation

## 2023-02-13 DIAGNOSIS — F0390 Unspecified dementia without behavioral disturbance: Secondary | ICD-10-CM | POA: Insufficient documentation

## 2023-02-13 DIAGNOSIS — I700 Atherosclerosis of aorta: Secondary | ICD-10-CM | POA: Diagnosis not present

## 2023-02-13 DIAGNOSIS — M47816 Spondylosis without myelopathy or radiculopathy, lumbar region: Secondary | ICD-10-CM | POA: Diagnosis not present

## 2023-02-13 DIAGNOSIS — M542 Cervicalgia: Secondary | ICD-10-CM | POA: Diagnosis not present

## 2023-02-13 DIAGNOSIS — M5021 Other cervical disc displacement,  high cervical region: Secondary | ICD-10-CM | POA: Diagnosis not present

## 2023-02-13 DIAGNOSIS — M47812 Spondylosis without myelopathy or radiculopathy, cervical region: Secondary | ICD-10-CM | POA: Diagnosis not present

## 2023-02-13 DIAGNOSIS — R410 Disorientation, unspecified: Secondary | ICD-10-CM | POA: Insufficient documentation

## 2023-02-13 DIAGNOSIS — R911 Solitary pulmonary nodule: Secondary | ICD-10-CM | POA: Diagnosis not present

## 2023-02-13 LAB — CBC WITH DIFFERENTIAL/PLATELET
Abs Immature Granulocytes: 0.07 10*3/uL (ref 0.00–0.07)
Basophils Absolute: 0.1 10*3/uL (ref 0.0–0.1)
Basophils Relative: 1 %
Eosinophils Absolute: 0 10*3/uL (ref 0.0–0.5)
Eosinophils Relative: 0 %
HCT: 39.7 % (ref 36.0–46.0)
Hemoglobin: 12.2 g/dL (ref 12.0–15.0)
Immature Granulocytes: 1 %
Lymphocytes Relative: 15 %
Lymphs Abs: 1.6 10*3/uL (ref 0.7–4.0)
MCH: 27.4 pg (ref 26.0–34.0)
MCHC: 30.7 g/dL (ref 30.0–36.0)
MCV: 89 fL (ref 80.0–100.0)
Monocytes Absolute: 0.7 10*3/uL (ref 0.1–1.0)
Monocytes Relative: 6 %
Neutro Abs: 8.4 10*3/uL — ABNORMAL HIGH (ref 1.7–7.7)
Neutrophils Relative %: 77 %
Platelets: 135 10*3/uL — ABNORMAL LOW (ref 150–400)
RBC: 4.46 MIL/uL (ref 3.87–5.11)
RDW: 13.4 % (ref 11.5–15.5)
WBC: 10.8 10*3/uL — ABNORMAL HIGH (ref 4.0–10.5)
nRBC: 0 % (ref 0.0–0.2)

## 2023-02-13 LAB — COMPREHENSIVE METABOLIC PANEL
ALT: 15 U/L (ref 0–44)
AST: 24 U/L (ref 15–41)
Albumin: 3.5 g/dL (ref 3.5–5.0)
Alkaline Phosphatase: 49 U/L (ref 38–126)
Anion gap: 8 (ref 5–15)
BUN: 17 mg/dL (ref 8–23)
CO2: 25 mmol/L (ref 22–32)
Calcium: 8.9 mg/dL (ref 8.9–10.3)
Chloride: 102 mmol/L (ref 98–111)
Creatinine, Ser: 0.76 mg/dL (ref 0.44–1.00)
GFR, Estimated: >60 mL/min (ref >60)
Glucose, Bld: 140 mg/dL — ABNORMAL HIGH (ref 70–99)
Potassium: 4 mmol/L (ref 3.5–5.1)
Sodium: 135 mmol/L (ref 135–145)
Total Bilirubin: 0.4 mg/dL (ref 0.3–1.2)
Total Protein: 8 g/dL (ref 6.5–8.1)

## 2023-02-13 LAB — I-STAT CHEM 8, ED
BUN: 17 mg/dL (ref 8–23)
Calcium, Ion: 1.22 mmol/L (ref 1.15–1.40)
Chloride: 103 mmol/L (ref 98–111)
Creatinine, Ser: 0.8 mg/dL (ref 0.44–1.00)
Glucose, Bld: 136 mg/dL — ABNORMAL HIGH (ref 70–99)
HCT: 40 % (ref 36.0–46.0)
Hemoglobin: 13.6 g/dL (ref 12.0–15.0)
Potassium: 4.5 mmol/L (ref 3.5–5.1)
Sodium: 139 mmol/L (ref 135–145)
TCO2: 25 mmol/L (ref 22–32)

## 2023-02-13 LAB — ETHANOL: Alcohol, Ethyl (B): <10 mg/dL (ref <10)

## 2023-02-13 NOTE — ED Notes (Addendum)
Pt had bowel movement ( watery) while that was happening her heart rate dropped below 30

## 2023-02-13 NOTE — ED Triage Notes (Signed)
Patient presents due to falling on the coffee table. She did not hit her head and is not on a blood thinner. Fall happed about 1 hour ago. EMS noted a hematoma on the left lower back that is tender to touch.     Hx dementia   EMS vitals: 122/66 BP 76 HR 96 % SPO2 on room air 14 RR

## 2023-02-13 NOTE — ED Notes (Addendum)
All blood was collected for Ashley Willis, as well another IV was place on Lt side.  EKG was handed to provider. Ashley Willis was experiencing a low HR to below 30 . Ashley Willis did not pass the 3 oz of water . Was able to drink half

## 2023-02-13 NOTE — ED Provider Notes (Signed)
[place] EMERGENCY DEPARTMENT AT [facility] Provider Note   CSN: [id number] Arrival date & time: 02/13/23  1718     History  Chief Complaint  Patient presents with   Marletta Lor    Ashley Willis is a 89 y.o. female with medical history significant for CAD, MI 1989, gallstones, GERD, hiatal hernia, hypertension, insomnia, memory loss, pancreatitis, vitamin B12 deficiency.  Patient presents to ED for evaluation of fall.  Patient here with her daughter.  Per patient daughter, patient had ground-level fall prior to arrival.  Patient daughter reports that the patient was standing under her own power when all of a sudden she stated that she was "falling" and fell backwards striking her low back against a wooden coffee table, landing on carpeted surface.  Patient did not hit her head, did not lose consciousness.  The patient does not take blood thinning medication.  When I approached the patient bed in the hallway, the patient was staring forward seemingly altered.  Patient daughter reports that the patient was having a conversation with her prior to my arrival at the bedside.  On my arrival, the patient is gurgling and not responding.  Her eyes are open and she is staring straight ahead, she would not follow commands.  Painful stimuli was applied and the patient did not react.  Attending Dr. Rhae Hammock called to the bedside at this time.  Stroke order set initiated at this time.  The patient was taken to the CT scanner for stat head CT along with CT cervical spine, CT lumbar spine.  Upon arrival to CT scanner, patient began responding once more.  Patient is at her baseline status per her daughter on my reassessment.   Fall       Home Medications Prior to Admission medications   Medication Sig Start Date End Date Taking? Authorizing Provider  cetirizine (ZYRTEC) 10 MG chewable tablet Chew 10 mg by mouth daily.    [provider]  cholecalciferol (VITAMIN D) 1000 UNITS tablet Take  2,000 Units by mouth daily.    [provider]  dextromethorphan 15 MG/5ML syrup Take 10 mLs (30 mg total) by mouth 4 (four) times daily as needed for cough. Patient not taking: Reported on 11/07/2022 10/31/21   Omar Person, MD  fluticasone [facility]) 50 MCG/ACT nasal spray Place 1 spray into both nostrils daily. Patient not taking: Reported on 11/07/2022 10/31/21   Omar Person, MD  ipratropium-albuterol (DUONEB) 0.5-2.5 (3) MG/3ML SOLN Inhale 3 mLs into the lungs every 6 (six) hours as needed (For shortness of breath). 07/28/22   [provider]      Allergies    Codeine    Review of Systems   Review of Systems  Unable to perform ROS: Dementia (Level 5 caveat)    Physical Exam Updated Vital Signs BP (!) 112/58   Pulse 71   Temp 98 F (36.7 C) (Oral)   Resp 16   SpO2 96%  Physical Exam Vitals and nursing note reviewed.  Constitutional:      General: She is not in acute distress.    Appearance: She is not ill-appearing, toxic-appearing or diaphoretic.  HENT:     Head: Normocephalic and atraumatic.     Nose: Nose normal.     Mouth/Throat:     Mouth: Mucous membranes are moist.     Pharynx: Oropharynx is clear.  Eyes:     Extraocular Movements: Extraocular movements intact.     Conjunctiva/sclera: Conjunctivae normal.  Pupils: Pupils are equal, round, and reactive to light.  Cardiovascular:     Rate and Rhythm: Normal rate and regular rhythm.  Pulmonary:     Effort: Pulmonary effort is normal.     Breath sounds: Normal breath sounds. No wheezing.  Abdominal:     General: Abdomen is flat.     Tenderness: There is no abdominal tenderness.  Musculoskeletal:     Cervical back: Normal range of motion and neck supple. No tenderness.  Skin:    General: Skin is warm and dry.     Capillary Refill: Capillary refill takes less than 2 seconds.  Neurological:     Mental Status: She is lethargic and disoriented.     GCS: GCS eye subscore is 4. GCS  verbal subscore is 5. GCS motor subscore is 6.     Cranial Nerves: Cranial nerves 2-12 are intact.     Sensory: Sensation is intact. No sensory deficit.     Motor: Motor function is intact.     Comments: Patient CN II through XII intact.  Intact finger-nose, heel-to-shin.  She is disoriented however patient daughter at bedside reports that this is her baseline.  Equal strength to upper extremities bilaterally.  Equal strength bilateral lower extremities.  Intact sensation.     ED Results / Procedures / Treatments   Labs (all labs ordered are listed, but only abnormal results are displayed) Labs Reviewed  ETHANOL  PROTIME-INR  APTT  CBC  DIFFERENTIAL  COMPREHENSIVE METABOLIC PANEL  RAPID URINE DRUG SCREEN, HOSP PERFORMED  URINALYSIS, ROUTINE W REFLEX MICROSCOPIC  I-STAT CHEM 8, ED    EKG None  Radiology No results found.  Procedures Procedures   Medications Ordered in ED Medications - No data to display  ED Course/ Medical Decision Making/ A&P  Medical Decision Making Amount and/or Complexity of Data Reviewed Labs: ordered. Radiology: ordered.  Risk Decision regarding hospitalization.   89 year old female presents to the ED for evaluation.  Please see HPI for further details.  On examination the patient is afebrile and nontachycardic.  Lung sounds are bilaterally, she is  not hypoxic.  Abdomen is soft and compressible throughout.  Patient is extremely altered at this time.  The patient upon my arrival to the bedside is staring straight forward and not responding to questions.  The patient daughter reports that the patient was responding to questions prior to my arrival however has had a marked decrease in alertness.  She has audibly gurgling, she is not responsive to painful stimuli.  Called attending Dr. Rhae Hammock to the bedside at this time.  ED stroke order set activated, patient taken to the CT scanner for stat head CT.  Upon arrival to the CT scanner, patient returned to  baseline mental status.  Will collect stroke order set.  Will assess patient with CT head, CT cervical spine, CT lumbar spine.  At the end of my shift, the patient workup is not yet complete.  Patient labs have been drawn and her imaging studies have not been reviewed.  Patient signed out to attending Dr. Rhae Hammock at this time pending lab results and imaging studies.   Dr. Imogene Burn evaluated the patient here and after he discussed his plans for admission with the family they ultimately decided that they would like to go home.  He is placing a consult note.  I will discharge patient here.   Final Clinical Impression(s) / ED Diagnoses Final diagnoses:  Fall, initial encounter    Rx / DC Orders ED  Discharge Orders     None         Clent Ridges 02/13/23 1934    Durwin Glaze, MD 02/13/23 1610    Durwin Glaze, MD 02/14/23 [phone]

## 2023-02-14 ENCOUNTER — Encounter (HOSPITAL_COMMUNITY): Payer: Self-pay | Admitting: Internal Medicine

## 2023-02-14 DIAGNOSIS — R404 Transient alteration of awareness: Secondary | ICD-10-CM | POA: Diagnosis not present

## 2023-02-14 DIAGNOSIS — Y92009 Unspecified place in unspecified non-institutional (private) residence as the place of occurrence of the external cause: Secondary | ICD-10-CM

## 2023-02-14 DIAGNOSIS — W19XXXA Unspecified fall, initial encounter: Secondary | ICD-10-CM | POA: Diagnosis not present

## 2023-02-14 NOTE — ED Notes (Signed)
PT needs PTAR to home in ambulance.

## 2023-02-14 NOTE — ED Notes (Signed)
Pt is going home with family. Refused to wait on EMS. So early plus cath

## 2023-02-14 NOTE — ED Notes (Signed)
Discharge is pending due to transportation. Ambulance was contacted, was informed of a long wait ETA

## 2023-02-14 NOTE — Assessment & Plan Note (Signed)
Pt had 2 brief spells in the ER. No actually LOC. Mostly staring off spells and pt not responding to voice. Had a long discussion with pt's dtr Ashley Willis. She understands that pt is 89 yo. Pt is already a DNR/DNI. Discussed what an EEG and what it helps diagnosis.  Explained to dtr that AEDs used for seizure disorder most often makes the patient much more sleepy and can sometime affect their balance. Dtr states that pt already sleeps 12 hours at a time and is only awake for 1-2 hours before wanting to go back to sleep. Discussed that AEDs would make she sleep much more. Dtr does not want that. Dtr's goal is to keep pt at her own home. Dtr and her brothers take turns staying at pt's home. Pt gets agitated when she is outside of her home. In the end, shared decision making resulted in dtr wanting the patient to return home tonight.  ER to arrange for ambulance to take her home.

## 2023-02-14 NOTE — Discharge Instructions (Signed)
Please follow up with your doctor and return to the ER for worsening symptoms.

## 2023-02-14 NOTE — Subjective & Objective (Signed)
CC: fall at home. HPI: 89 year old Afro-American female with a history of senility, hyperlipidemia, hypertension, coronary disease who is now on minimal medications due to family's and PCP choice of stopping meds presents to the ER after a fall at home.  The daughter Elease Hashimoto states that she was walking with the patient to the front door after friends had stopped to visit with the patient.  Daughter and patient were walking towards the door.  Daughter continue to walk to the door but the patient had stopped.  Daughter did not notice the patient has stopped walking.  Patient complained of dizziness.  Daughter turned around and saw the patient fall backwards.  Patient struck her back against a glass coffee table.  She did not hit her head.  EMS was called.  Patient was brought to the ER.  While in the ER, she was noted to have 2 staring spells.  She did not have any loss of consciousness.  Patient was not responsive to voice but her eyes were open.  Blood sugar was normal.  Daughter states that she has never had spells like this before.  Unclear if his spells are related to any back pain.  Workup in the ER was essentially unremarkable.  Trauma scans including CT head CT C-spine and lumbar spine were all negative for acute fractures.  Due to these staring spells, [facility] consulted.

## 2023-02-14 NOTE — Consult Note (Signed)
Hospitalist Consultation History and Physical    Ashley Willis IWP:[id number] DOB: 12/13/1926 DOA: 02/13/2023  DOS: the patient was seen and examined on 02/13/2023  PCP: Clovis Riley, L.August Saucer, MD   Patient coming from: Home  I have personally briefly reviewed patient's old medical records in [facility]  CC: fall at home. HPI: 89 year old Afro-American female with a history of senility, hyperlipidemia, hypertension, coronary disease who is now on minimal medications due to family's and PCP choice of stopping meds presents to the ER after a fall at home.  The daughter Elease Hashimoto states that she was walking with the patient to the front door after friends had stopped to visit with the patient.  Daughter and patient were walking towards the door.  Daughter continue to walk to the door but the patient had stopped.  Daughter did not notice the patient has stopped walking.  Patient complained of dizziness.  Daughter turned around and saw the patient fall backwards.  Patient struck her back against a glass coffee table.  She did not hit her head.  EMS was called.  Patient was brought to the ER.  While in the ER, she was noted to have 2 staring spells.  She did not have any loss of consciousness.  Patient was not responsive to voice but her eyes were open.  Blood sugar was normal.  Daughter states that she has never had spells like this before.  Unclear if his spells are related to any back pain.  Workup in the ER was essentially unremarkable.  Trauma scans including CT head CT C-spine and lumbar spine were all negative for acute fractures.  Due to these staring spells, [facility] consulted.   ED Course: CT head, c-spine, lumbar spine negative for acute fracture  Review of Systems:  Review of Systems  Unable to perform ROS: Dementia    Past Medical History:  Diagnosis Date   Anemia    CAD (coronary artery disease) 07/14/2006   ECHO - EF >55%; borderline concentric LVH; LA mildly  dilated, mild tricuspid regurgitation   CAD (coronary artery disease) 08/27/2010   R/P MV - LV normal in size, fixed mid-basal inferior defect is seen, in absence of gated images not possible to distinguish diaphragmatic atenuation from nontransmural infarction; no significant ischemia detected   Calculus of gallbladder without mention of cholecystitis or obstruction    Diverticulosis of colon (without mention of hemorrhage)    Esophageal reflux    Hiatal hernia    Hyperlipemia    Hypertension    Insomnia, unspecified    Memory loss    MI 08/27/2008   Qualifier: Diagnosis of   By: Koleen Distance CMA (AAMA), Hulan Saas      Replacing diagnoses that were inactivated after the 09/14/22 regulatory import     Myocardial infarct (HCC) 06/16/1987   inferior wall MI - total occlusionof distal RCA beyond PDA branch, treated w/ medical therapy   Osteoporosis, unspecified    Other symptoms involving abdomen and pelvis(789.9)    Pancreatitis    Personal history of colonic polyps 03/03/2010   tubular adenomas   Rheumatoid arthritis(714.0)    Stricture and stenosis of esophagus    Vitamin B12 deficiency     Past Surgical History:  Procedure Laterality Date   ANGIOPLASTY     APPENDECTOMY     AXILLARY SURGERY     BREAST BIOPSY Right 05/2006   CHOLECYSTECTOMY       reports that she has never smoked. She has never used smokeless tobacco. She  reports that she does not drink alcohol and does not use drugs.  Allergies  Allergen Reactions   Codeine     Family History  Problem Relation Age of Onset   Diabetes Mother    Colon cancer Neg Hx     Prior to Admission medications   Medication Sig Start Date End Date Taking? Authorizing Provider  cetirizine (ZYRTEC) 10 MG chewable tablet Chew 10 mg by mouth daily.   Yes [provider]  cholecalciferol (VITAMIN D) 1000 UNITS tablet Take 2,000 Units by mouth daily.   Yes [provider]  ipratropium-albuterol (DUONEB) 0.5-2.5 (3) MG/3ML SOLN  Inhale 3 mLs into the lungs every 6 (six) hours as needed (For shortness of breath). 07/28/22  Yes [provider]  dextromethorphan 15 MG/5ML syrup Take 10 mLs (30 mg total) by mouth 4 (four) times daily as needed for cough. Patient not taking: Reported on 11/07/2022 10/31/21   Omar Person, MD  fluticasone [facility]) 50 MCG/ACT nasal spray Place 1 spray into both nostrils daily. Patient not taking: Reported on 11/07/2022 10/31/21   Omar Person, MD    Physical Exam: Vitals:   02/13/23 1741 02/13/23 2120  BP: (!) 112/58 (!) 103/53  Pulse: 71 83  Resp: 16 16  Temp: 98 F (36.7 C) 97.6 F (36.4 C)  TempSrc: Oral Oral  SpO2: 96% 99%    Physical Exam Vitals and nursing note reviewed.  Constitutional:      Appearance: She is not toxic-appearing.     Comments: Elderly female. No distress.   HENT:     Head: Normocephalic and atraumatic.     Nose: Nose normal.  Eyes:     General: No scleral icterus. Cardiovascular:     Rate and Rhythm: Normal rate and regular rhythm.  Pulmonary:     Effort: Pulmonary effort is normal.     Breath sounds: Normal breath sounds.  Abdominal:     General: Bowel sounds are normal. There is no distension.     Palpations: Abdomen is soft.     Tenderness: There is no abdominal tenderness.  Musculoskeletal:     Right lower leg: No edema.     Left lower leg: No edema.  Skin:    General: Skin is warm and dry.      Labs on Admission: I have personally reviewed following labs and imaging studies  CBC: Recent Labs  Lab 02/13/23 2015 02/13/23 2022  WBC 10.8*  --   NEUTROABS 8.4*  --   HGB 12.2 13.6  HCT 39.7 40.0  MCV 89.0  --   PLT 135*  --    Basic Metabolic Panel: Recent Labs  Lab 02/13/23 2010 02/13/23 2022  NA 135 139  K 4.0 4.5  CL 102 103  CO2 25  --   GLUCOSE 140* 136*  BUN 17 17  CREATININE 0.76 0.80  CALCIUM 8.9  --    GFR: CrCl cannot be calculated (Unknown ideal weight.). Liver Function Tests: Recent  Labs  Lab 02/13/23 2010  AST 24  ALT 15  ALKPHOS 49  BILITOT 0.4  PROT 8.0  ALBUMIN 3.5    Radiological Exams on Admission: I have personally reviewed images CT Lumbar Spine Wo Contrast  Result Date: 02/13/2023 CLINICAL DATA:  Ground level fall in a 89 year old female history of dementia. Hit low back against a coffee table. EXAM: CT LUMBAR SPINE WITHOUT CONTRAST TECHNIQUE: Multidetector CT imaging of the lumbar spine was performed without intravenous contrast administration. Multiplanar CT  image reconstructions were also generated. RADIATION DOSE REDUCTION: This exam was performed according to the departmental dose-optimization program which includes automated exposure control, adjustment of the mA and/or kV according to patient size and/or use of iterative reconstruction technique. COMPARISON:  None Available. FINDINGS: Segmentation: 5 lumbar type vertebrae. Alignment: No evidence of traumatic malalignment. Vertebrae: Demineralization.  No acute fracture. Paraspinal and other soft tissues: Aortic atherosclerotic calcification. Disc levels: Multilevel degenerative spondylosis, disc space height loss, and degenerative endplate change greatest at L4-L5 where it is advanced. IMPRESSION: No acute fracture in the lumbar spine. Electronically Signed   By: Minerva Fester M.D.   On: 02/13/2023 20:08   CT Cervical Spine Wo Contrast  Result Date: 02/13/2023 CLINICAL DATA:  Neck pain, fell EXAM: CT CERVICAL SPINE WITHOUT CONTRAST TECHNIQUE: Multidetector CT imaging of the cervical spine was performed without intravenous contrast. Multiplanar CT image reconstructions were also generated. RADIATION DOSE REDUCTION: This exam was performed according to the departmental dose-optimization program which includes automated exposure control, adjustment of the mA and/or kV according to patient size and/or use of iterative reconstruction technique. COMPARISON:  None Available. FINDINGS: Alignment: There is mild  degenerative retrolisthesis of C3 on C4 and C4 on C5. Otherwise alignment is anatomic. Skull base and vertebrae: No acute fracture. No primary bone lesion or focal pathologic process. Soft tissues and spinal canal: No prevertebral fluid or swelling. No visible canal hematoma. Disc levels: Multilevel spondylosis most pronounced at C3-4, C4-5, and C5-6. Symmetrical neural foraminal encroachment at those levels. Upper chest: Airway is patent. 4 mm right apical pulmonary nodule reference image 84/7. Other: Reconstructed images demonstrate no additional findings. IMPRESSION: 1. No acute cervical spine fracture. 2. Multilevel cervical spondylosis as above. 3. Right solid pulmonary nodule within the upper lobe measuring 4 mm. Per Fleischner Society Guidelines, if patient is low risk for malignancy, no routine follow-up imaging is recommended. If patient is high risk for malignancy, a non-contrast Chest CT at 12 months is optional. If performed and the nodule is stable at 12 months, no further follow-up is recommended. These guidelines do not apply to immunocompromised patients and patients with cancer. Follow up in patients with significant comorbidities as clinically warranted. For lung cancer screening, adhere to Lung-RADS guidelines. Reference: Radiology. 2017; 284(1):228-43. Electronically Signed   By: Sharlet Salina M.D.   On: 02/13/2023 20:08   CT HEAD WO CONTRAST  Result Date: 02/13/2023 CLINICAL DATA:  Altered level of consciousness EXAM: CT HEAD WITHOUT CONTRAST TECHNIQUE: Contiguous axial images were obtained from the base of the skull through the vertex without intravenous contrast. RADIATION DOSE REDUCTION: This exam was performed according to the departmental dose-optimization program which includes automated exposure control, adjustment of the mA and/or kV according to patient size and/or use of iterative reconstruction technique. COMPARISON:  10/06/2022 FINDINGS: Brain: Stable chronic small-vessel  ischemic changes throughout the periventricular white matter. No acute infarct or hemorrhage. Lateral ventricles and midline structures are unremarkable. No acute extra-axial fluid collection. No mass effect. Vascular: No hyperdense vessel or unexpected calcification. Skull: Normal. Negative for fracture or focal lesion. Sinuses/Orbits: No acute finding. Other: None. IMPRESSION: 1. Stable head CT, no acute intracranial process. Electronically Signed   By: Sharlet Salina M.D.   On: 02/13/2023 20:03    EKG: My personal interpretation of EKG shows: NSR  Assessment/Plan Active Problems:   Fall at home, initial encounter   Spell of altered consciousness    Assessment and Plan: Spell of altered consciousness Pt had 2 brief spells in the  ER. No actually LOC. Mostly staring off spells and pt not responding to voice. Had a long discussion with pt's dtr Lear Ng. She understands that pt is 89 yo. Pt is already a DNR/DNI. Discussed what an EEG and what it helps diagnosis.  Explained to dtr that AEDs used for seizure disorder most often makes the patient much more sleepy and can sometime affect their balance. Dtr states that pt already sleeps 12 hours at a time and is only awake for 1-2 hours before wanting to go back to sleep. Discussed that AEDs would make she sleep much more. Dtr does not want that. Dtr's goal is to keep pt at her own home. Dtr and her brothers take turns staying at pt's home. Pt gets agitated when she is outside of her home. In the end, shared decision making resulted in dtr wanting the patient to return home tonight.  ER to arrange for ambulance to take her home.  Fall at home, initial encounter Pt fell today with her dtr as a witness. No head trauma. CT scans negative for fracture  Code Status: DNR/DNI(Do NOT Intubate) Family Communication: discussed with pt's dtr Lear Ng  Disposition Plan: return home  Admission status:  shared decision making with pt's dtr. Plan on DC  back to home via ambulance ,  EDP aware of dtr decision to be discharged to home. Will need ambulance transport back home.   Carollee Herter, DO [facility] 02/14/2023, 12:39 AM

## 2023-02-14 NOTE — Assessment & Plan Note (Signed)
Pt fell today with her dtr as a witness. No head trauma. CT scans negative for fracture

## 2023-03-05 ENCOUNTER — Encounter (HOSPITAL_COMMUNITY): Payer: Self-pay | Admitting: Family Medicine

## 2023-03-05 DIAGNOSIS — S300XXD Contusion of lower back and pelvis, subsequent encounter: Secondary | ICD-10-CM | POA: Diagnosis not present

## 2023-03-05 DIAGNOSIS — Z23 Encounter for immunization: Secondary | ICD-10-CM | POA: Diagnosis not present

## 2023-03-08 ENCOUNTER — Other Ambulatory Visit (HOSPITAL_COMMUNITY): Payer: Self-pay | Admitting: Family Medicine

## 2023-03-08 DIAGNOSIS — T148XXA Other injury of unspecified body region, initial encounter: Secondary | ICD-10-CM

## 2023-03-08 NOTE — Progress Notes (Unsigned)
Simonne Come, MD  Leodis Rains D No imaging.  IF referring MD is uncertain there is a hematoma, would recommend a soft tissue US.  IF the referring MD thinks there is definitely a drainable hematoma, may schedule for Soft tissue US followed by potential US guided aspiration to be performed same day.  No sedation.  Nedra Hai

## 2023-03-16 ENCOUNTER — Ambulatory Visit (INDEPENDENT_AMBULATORY_CARE_PROVIDER_SITE_OTHER): Payer: Medicare Other | Admitting: Podiatry

## 2023-03-16 ENCOUNTER — Encounter: Payer: Self-pay | Admitting: Podiatry

## 2023-03-16 DIAGNOSIS — M79674 Pain in right toe(s): Secondary | ICD-10-CM

## 2023-03-16 DIAGNOSIS — M79675 Pain in left toe(s): Secondary | ICD-10-CM

## 2023-03-16 DIAGNOSIS — B351 Tinea unguium: Secondary | ICD-10-CM | POA: Diagnosis not present

## 2023-03-16 NOTE — Progress Notes (Signed)
This patient returns to the office for evaluation and treatment of long thick painful nails .  This patient is unable to trim his own nails since the patient cannot reach the feet.  Patient says the nails are painful walking and wearing her shoes.  She returns for preventive foot care services.   She presents with her son.  General Appearance  Alert, conversant and in no acute stress.  Vascular  Dorsalis pedis and posterior tibial  pulses are weakly  palpable  bilaterally.  Capillary return is within normal limits  bilaterally. Temperature is within normal limits  bilaterally.  Neurologic  Senn-Weinstein monofilament wire test diminished   bilaterally. Muscle power within normal limits bilaterally.  Nails Thick disfigured discolored nails with subungual debris  from hallux to fifth toes bilaterally.  Orthopedic  No limitations of motion  feet .  No crepitus or effusions noted.  No bony pathology or digital deformities noted.  HAV  B/L. Overlapping second toe left foot.  Overlapping hallux right foot.  Skin  normotropic skin with no porokeratosis noted bilaterally.  No signs of infections or ulcers noted.     Onychomycosis  Pain in toes right foot  Pain in toes left foot    Debridement  of nails  1-5  B/L with a nail nipper.  Nails were then filed using a dremel tool with no incidents.   Trish to talk with son concerning orthopedic shoes since patient does not qualify for diabetic shoes.     Helane Gunther DPM

## 2023-03-24 ENCOUNTER — Other Ambulatory Visit (HOSPITAL_COMMUNITY): Payer: Medicare Other

## 2023-03-24 ENCOUNTER — Ambulatory Visit (HOSPITAL_COMMUNITY): Payer: Medicare Other

## 2023-03-26 ENCOUNTER — Ambulatory Visit (HOSPITAL_COMMUNITY)
Admission: RE | Admit: 2023-03-26 | Discharge: 2023-03-26 | Disposition: A | Discharge status: Home / Self Care | Payer: Medicare Other | Source: Ambulatory Visit | Attending: Family Medicine | Admitting: Family Medicine

## 2023-03-26 DIAGNOSIS — T148XXA Other injury of unspecified body region, initial encounter: Secondary | ICD-10-CM

## 2023-03-26 DIAGNOSIS — X58XXXA Exposure to other specified factors, initial encounter: Secondary | ICD-10-CM | POA: Insufficient documentation

## 2023-03-26 DIAGNOSIS — S300XXA Contusion of lower back and pelvis, initial encounter: Secondary | ICD-10-CM | POA: Diagnosis not present

## 2023-03-26 MED ORDER — LIDOCAINE HCL 1 % IJ SOLN
INTRAMUSCULAR | Status: AC
  Filled 2023-03-26: qty 20

## 2023-06-07 ENCOUNTER — Ambulatory Visit (INDEPENDENT_AMBULATORY_CARE_PROVIDER_SITE_OTHER): Payer: Medicare Other | Admitting: Otolaryngology

## 2023-06-07 ENCOUNTER — Encounter (INDEPENDENT_AMBULATORY_CARE_PROVIDER_SITE_OTHER): Payer: Self-pay

## 2023-06-07 VITALS — Ht 62.0 in | Wt 99.0 lb

## 2023-06-07 DIAGNOSIS — H6123 Impacted cerumen, bilateral: Secondary | ICD-10-CM | POA: Insufficient documentation

## 2023-06-07 NOTE — Progress Notes (Signed)
Patient ID: Ashley Willis, female   DOB: [birth year], 89 y.o.   MRN: [id number]  Procedure: Bilateral cerumen disimpaction.   Indication: Cerumen impaction, resulting in ear discomfort and conductive hearing loss.   Description: The patient is placed supine on the operating table. Under the operating microscope, the right ear canal is examined and is noted to be impacted with cerumen. The cerumen is carefully removed with a combination of suction catheters, cerumen curette, and alligator forceps. After the cerumen removal, the ear canal and tympanic membrane are noted to be normal. No middle ear effusion is noted. The same procedure is then repeated on the left side without exception. The patient tolerated the procedure well.  Follow-up care:  The patient is instructed not to use Q-tips to clean the ear canals. The patient will follow up in 6 months.

## 2023-06-22 ENCOUNTER — Ambulatory Visit: Payer: Medicare Other | Admitting: Podiatry

## 2023-06-29 ENCOUNTER — Encounter: Payer: Self-pay | Admitting: Podiatry

## 2023-06-29 ENCOUNTER — Ambulatory Visit (INDEPENDENT_AMBULATORY_CARE_PROVIDER_SITE_OTHER): Payer: Medicare Other | Admitting: Podiatry

## 2023-06-29 DIAGNOSIS — B351 Tinea unguium: Secondary | ICD-10-CM

## 2023-06-29 DIAGNOSIS — M79674 Pain in right toe(s): Secondary | ICD-10-CM | POA: Diagnosis not present

## 2023-06-29 DIAGNOSIS — M79675 Pain in left toe(s): Secondary | ICD-10-CM

## 2023-06-29 NOTE — Progress Notes (Signed)
 This patient returns to the office for evaluation and treatment of long thick painful nails .  This patient is unable to trim his own nails since the patient cannot reach the feet.  Patient says the nails are painful walking and wearing her shoes.  She returns for preventive foot care services.   She presents with her son.  General Appearance  Alert, conversant and in no acute stress.  Vascular  Dorsalis pedis and posterior tibial  pulses are weakly  palpable  bilaterally.  Capillary return is within normal limits  bilaterally. Temperature is within normal limits  bilaterally.  Neurologic  Senn-Weinstein monofilament wire test diminished   bilaterally. Muscle power within normal limits bilaterally.  Nails Thick disfigured discolored nails with subungual debris  from hallux to fifth toes bilaterally.  Orthopedic  No limitations of motion  feet .  No crepitus or effusions noted.  No bony pathology or digital deformities noted.  HAV  B/L. Overlapping second toe left foot.  Overlapping hallux right foot.  Skin  normotropic skin with no porokeratosis noted bilaterally.  No signs of infections or ulcers noted.     Onychomycosis  Pain in toes right foot  Pain in toes left foot    Debridement  of nails  1-5  B/L with a nail nipper.  Nails were then filed using a dremel tool with no incidents.        Cordella Bold DPM

## 2023-07-30 DIAGNOSIS — F0390 Unspecified dementia without behavioral disturbance: Secondary | ICD-10-CM | POA: Diagnosis not present

## 2023-07-30 DIAGNOSIS — K580 Irritable bowel syndrome with diarrhea: Secondary | ICD-10-CM | POA: Diagnosis not present

## 2023-07-30 DIAGNOSIS — J984 Other disorders of lung: Secondary | ICD-10-CM | POA: Diagnosis not present

## 2023-08-24 DIAGNOSIS — R300 Dysuria: Secondary | ICD-10-CM | POA: Diagnosis not present

## 2023-09-07 DIAGNOSIS — R319 Hematuria, unspecified: Secondary | ICD-10-CM | POA: Diagnosis not present

## 2023-09-28 ENCOUNTER — Ambulatory Visit (INDEPENDENT_AMBULATORY_CARE_PROVIDER_SITE_OTHER): Payer: Medicare Other | Admitting: Podiatry

## 2023-09-28 ENCOUNTER — Encounter: Payer: Self-pay | Admitting: Podiatry

## 2023-09-28 DIAGNOSIS — F0390 Unspecified dementia without behavioral disturbance: Secondary | ICD-10-CM

## 2023-09-28 DIAGNOSIS — M79675 Pain in left toe(s): Secondary | ICD-10-CM

## 2023-09-28 DIAGNOSIS — M79674 Pain in right toe(s): Secondary | ICD-10-CM

## 2023-09-28 DIAGNOSIS — B351 Tinea unguium: Secondary | ICD-10-CM | POA: Diagnosis not present

## 2023-09-28 NOTE — Progress Notes (Signed)
 This patient returns to the office for evaluation and treatment of long thick painful nails .  This patient is unable to trim his own nails since the patient cannot reach the feet.  Patient says the nails are painful walking and wearing her shoes.  She returns for preventive foot care services.   She presents with her son.  General Appearance  Alert, conversant and in no acute stress.  Vascular  Dorsalis pedis and posterior tibial  pulses are weakly  palpable  bilaterally.  Capillary return is within normal limits  bilaterally. Temperature is within normal limits  bilaterally.  Neurologic  Senn-Weinstein monofilament wire test diminished   bilaterally. Muscle power within normal limits bilaterally.  Nails Thick disfigured discolored nails with subungual debris  from hallux to fifth toes bilaterally.  Orthopedic  No limitations of motion  feet .  No crepitus or effusions noted.  No bony pathology or digital deformities noted.  HAV  B/L. Overlapping second toe left foot.  Overlapping hallux right foot.  Skin  normotropic skin with no porokeratosis noted bilaterally.  No signs of infections or ulcers noted.     Onychomycosis  Pain in toes right foot  Pain in toes left foot    Debridement  of nails  1-5  B/L with a nail nipper.  Nails were then filed using a dremel tool with no incidents.   RTC  4 months      Ruffin Cotton DPM

## 2023-10-08 DIAGNOSIS — B3731 Acute candidiasis of vulva and vagina: Secondary | ICD-10-CM | POA: Diagnosis not present

## 2023-10-08 DIAGNOSIS — L292 Pruritus vulvae: Secondary | ICD-10-CM | POA: Diagnosis not present

## 2023-10-08 DIAGNOSIS — N905 Atrophy of vulva: Secondary | ICD-10-CM | POA: Diagnosis not present

## 2023-12-02 DIAGNOSIS — I10 Essential (primary) hypertension: Secondary | ICD-10-CM | POA: Diagnosis not present

## 2023-12-02 DIAGNOSIS — Z0000 Encounter for general adult medical examination without abnormal findings: Secondary | ICD-10-CM | POA: Diagnosis not present

## 2023-12-02 DIAGNOSIS — E538 Deficiency of other specified B group vitamins: Secondary | ICD-10-CM | POA: Diagnosis not present

## 2023-12-02 DIAGNOSIS — E559 Vitamin D deficiency, unspecified: Secondary | ICD-10-CM | POA: Diagnosis not present

## 2023-12-14 ENCOUNTER — Encounter (INDEPENDENT_AMBULATORY_CARE_PROVIDER_SITE_OTHER): Payer: Self-pay | Admitting: Otolaryngology

## 2023-12-14 ENCOUNTER — Ambulatory Visit (INDEPENDENT_AMBULATORY_CARE_PROVIDER_SITE_OTHER): Payer: Medicare Other | Admitting: Otolaryngology

## 2023-12-14 VITALS — BP 168/68 | HR 57

## 2023-12-14 DIAGNOSIS — H6123 Impacted cerumen, bilateral: Secondary | ICD-10-CM | POA: Diagnosis not present

## 2023-12-14 NOTE — Progress Notes (Signed)
 Patient ID: Ashley Willis, female   DOB: [birth year], 89 y.o.   MRN: [id number]  Procedure: Bilateral cerumen disimpaction.   Indication: Cerumen impaction, resulting in ear discomfort and conductive hearing loss.   Description: The patient is placed supine on the operating table. Under the operating microscope, the right ear canal is examined and is noted to be impacted with cerumen. The cerumen is carefully removed with a combination of suction catheters, cerumen curette, and alligator forceps. After the cerumen removal, the ear canal and tympanic membrane are noted to be normal. No middle ear effusion is noted. The same procedure is then repeated on the left side without exception. The patient tolerated the procedure well.  Follow-up care:  The patient is instructed not to use Q-tips to clean the ear canals. The patient will follow up in 6 months.

## 2024-02-01 ENCOUNTER — Ambulatory Visit: Admitting: Podiatry

## 2024-02-28 ENCOUNTER — Encounter: Payer: Self-pay | Admitting: Podiatry

## 2024-02-28 ENCOUNTER — Ambulatory Visit (INDEPENDENT_AMBULATORY_CARE_PROVIDER_SITE_OTHER): Admitting: Podiatry

## 2024-02-28 DIAGNOSIS — M79674 Pain in right toe(s): Secondary | ICD-10-CM | POA: Diagnosis not present

## 2024-02-28 DIAGNOSIS — F0390 Unspecified dementia without behavioral disturbance: Secondary | ICD-10-CM

## 2024-02-28 DIAGNOSIS — B351 Tinea unguium: Secondary | ICD-10-CM | POA: Diagnosis not present

## 2024-02-28 DIAGNOSIS — M79675 Pain in left toe(s): Secondary | ICD-10-CM | POA: Diagnosis not present

## 2024-02-28 NOTE — Progress Notes (Signed)
 This patient returns to the office for evaluation and treatment of long thick painful nails .  This patient is unable to trim his own nails since the patient cannot reach the feet.  Patient says the nails are painful walking and wearing her shoes.  She returns for preventive foot care services.   She presents with her son.  General Appearance  Alert, conversant and in no acute stress.  Vascular  Dorsalis pedis and posterior tibial  pulses are weakly  palpable  bilaterally.  Capillary return is within normal limits  bilaterally. Temperature is within normal limits  bilaterally.  Neurologic  Senn-Weinstein monofilament wire test diminished   bilaterally. Muscle power within normal limits bilaterally.  Nails Thick disfigured discolored nails with subungual debris  from hallux to fifth toes bilaterally.  Orthopedic  No limitations of motion  feet .  No crepitus or effusions noted.  No bony pathology or digital deformities noted.  HAV  B/L. Overlapping second toe left foot.  Overlapping hallux right foot.  Skin  normotropic skin with no porokeratosis noted bilaterally.  No signs of infections or ulcers noted.     Onychomycosis  Pain in toes right foot  Pain in toes left foot    Debridement  of nails  1-5  B/L with a nail nipper.  Nails were then filed using a dremel tool with no incidents.   RTC  3 months      Cordella Bold DPM

## 2024-04-01 IMAGING — DX DG CHEST 2V
2 series · 2 of 2 positions shown · non-contrast
Comparison: Radiograph 08/08/2021

CLINICAL DATA: Chronic cough

EXAM:
CHEST - 2 VIEW

[chest ap]
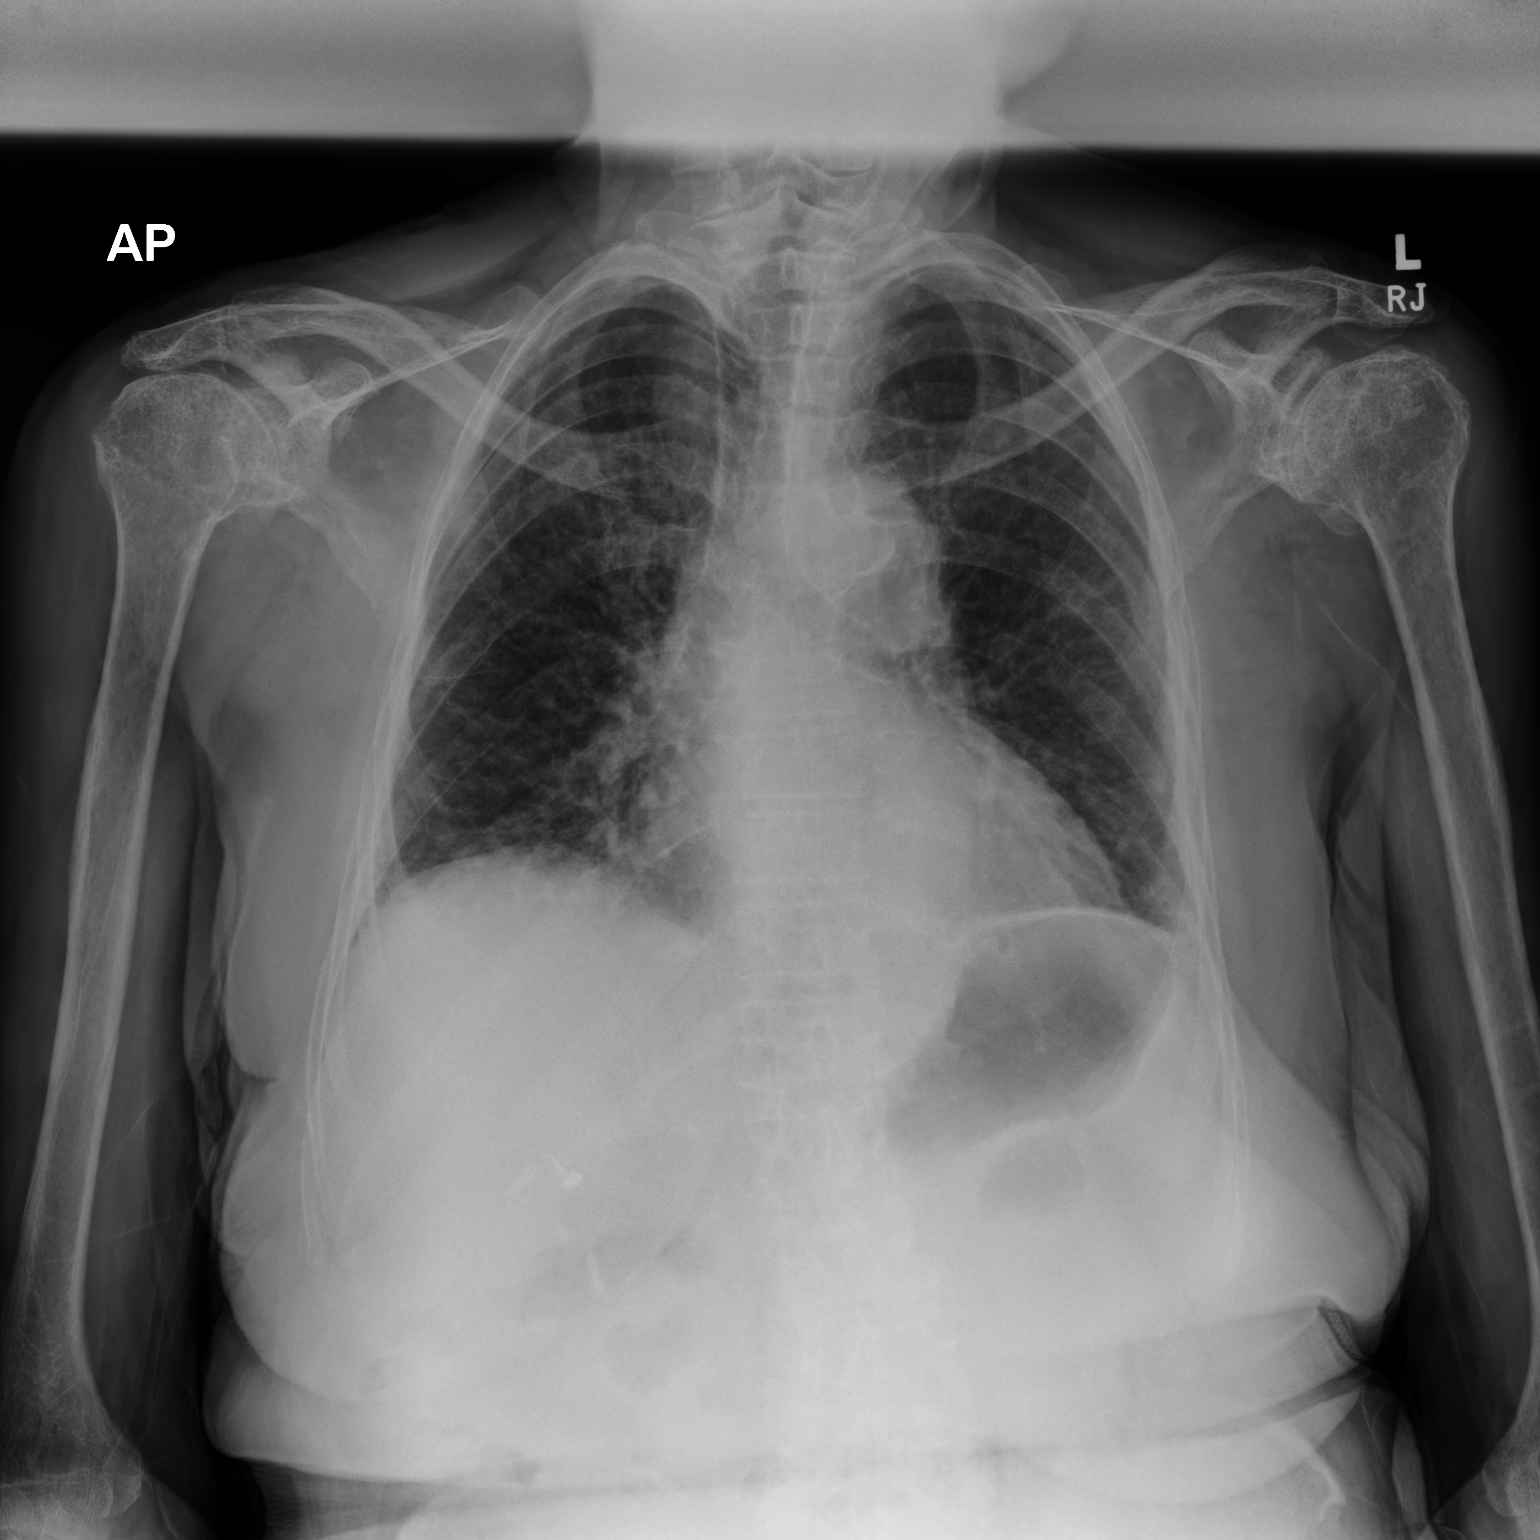

[chest lat]
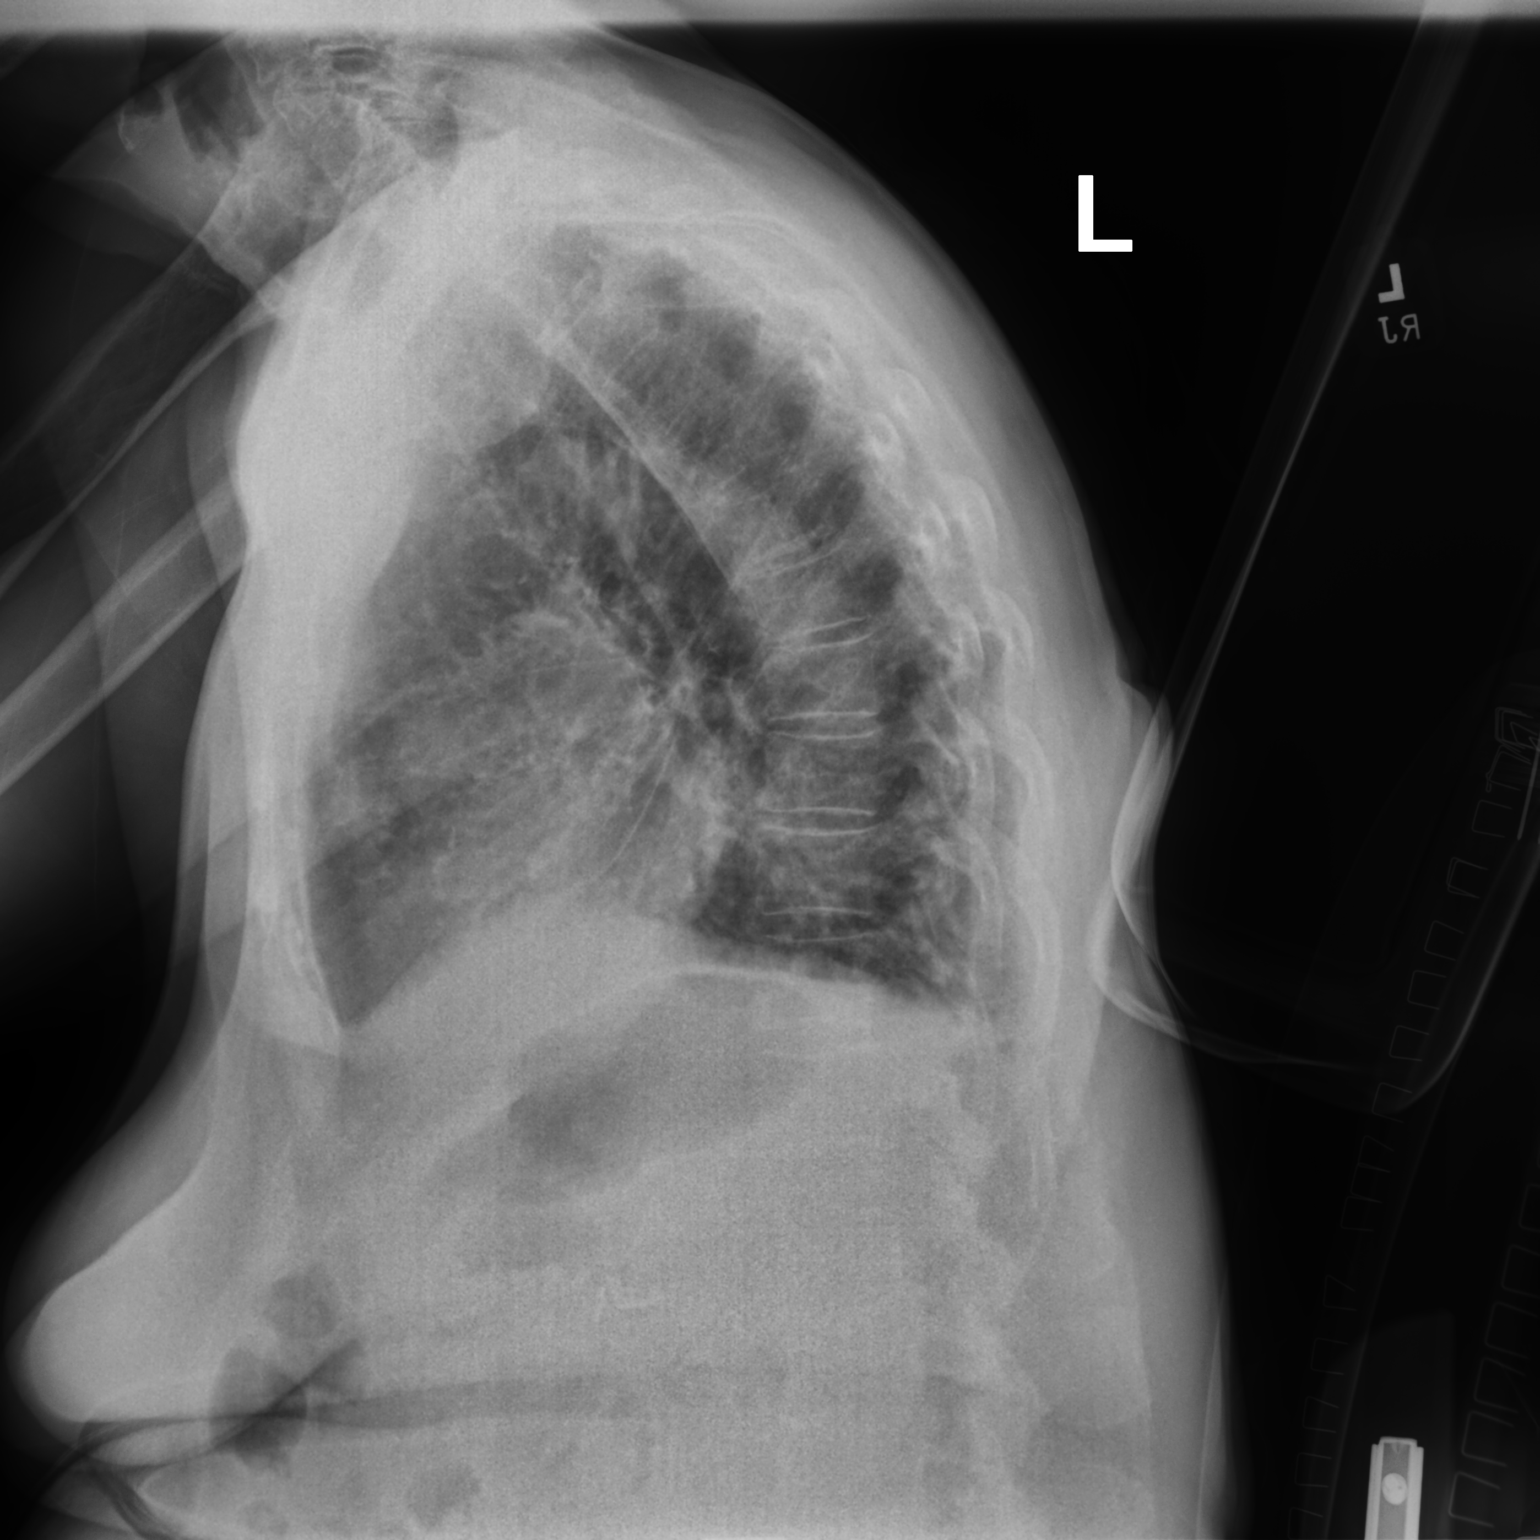

[2 of 2 positions shown; findings below may reference images not displayed]

FINDINGS: The cardiomediastinal silhouette is unchanged. There is bilateral
peribronchial thickening and mild interstitial opacities
bilaterally. There is no large pleural effusion. There is no visible
pneumothorax. Bilateral shoulder degenerative changes. Thoracic
spondylosis. No acute osseous abnormality.
IMPRESSION: Findings of bronchitis and chronic peripheral interstitial changes
suggesting the possibility of pulmonary fibrosis. High-resolution
chest CT would be useful for further evaluation. No focal airspace
consolidation.

## 2024-04-14 ENCOUNTER — Emergency Department (HOSPITAL_COMMUNITY)

## 2024-04-14 ENCOUNTER — Encounter (HOSPITAL_COMMUNITY): Payer: Self-pay

## 2024-04-14 ENCOUNTER — Inpatient Hospital Stay (HOSPITAL_COMMUNITY)
Admission: EM | Admit: 2024-04-14 | Discharge: 2024-04-16 | DRG: 871 | Disposition: A | Discharge status: Home / Self Care | Attending: Internal Medicine | Admitting: Internal Medicine

## 2024-04-14 ENCOUNTER — Other Ambulatory Visit: Payer: Self-pay

## 2024-04-14 DIAGNOSIS — R0989 Other specified symptoms and signs involving the circulatory and respiratory systems: Secondary | ICD-10-CM | POA: Diagnosis not present

## 2024-04-14 DIAGNOSIS — K219 Gastro-esophageal reflux disease without esophagitis: Secondary | ICD-10-CM | POA: Diagnosis present

## 2024-04-14 DIAGNOSIS — I252 Old myocardial infarction: Secondary | ICD-10-CM | POA: Diagnosis not present

## 2024-04-14 DIAGNOSIS — J45909 Unspecified asthma, uncomplicated: Secondary | ICD-10-CM | POA: Diagnosis present

## 2024-04-14 DIAGNOSIS — Z885 Allergy status to narcotic agent status: Secondary | ICD-10-CM

## 2024-04-14 DIAGNOSIS — R410 Disorientation, unspecified: Secondary | ICD-10-CM | POA: Diagnosis not present

## 2024-04-14 DIAGNOSIS — M129 Arthropathy, unspecified: Secondary | ICD-10-CM | POA: Diagnosis not present

## 2024-04-14 DIAGNOSIS — D696 Thrombocytopenia, unspecified: Secondary | ICD-10-CM | POA: Diagnosis present

## 2024-04-14 DIAGNOSIS — R7989 Other specified abnormal findings of blood chemistry: Secondary | ICD-10-CM | POA: Diagnosis present

## 2024-04-14 DIAGNOSIS — I700 Atherosclerosis of aorta: Secondary | ICD-10-CM | POA: Diagnosis not present

## 2024-04-14 DIAGNOSIS — E538 Deficiency of other specified B group vitamins: Secondary | ICD-10-CM | POA: Diagnosis present

## 2024-04-14 DIAGNOSIS — R509 Fever, unspecified: Secondary | ICD-10-CM | POA: Diagnosis not present

## 2024-04-14 DIAGNOSIS — A4151 Sepsis due to Escherichia coli [E. coli]: Principal | ICD-10-CM | POA: Diagnosis present

## 2024-04-14 DIAGNOSIS — I10 Essential (primary) hypertension: Secondary | ICD-10-CM | POA: Diagnosis present

## 2024-04-14 DIAGNOSIS — N3000 Acute cystitis without hematuria: Secondary | ICD-10-CM | POA: Diagnosis present

## 2024-04-14 DIAGNOSIS — G9341 Metabolic encephalopathy: Secondary | ICD-10-CM | POA: Diagnosis present

## 2024-04-14 DIAGNOSIS — G4700 Insomnia, unspecified: Secondary | ICD-10-CM | POA: Diagnosis present

## 2024-04-14 DIAGNOSIS — E785 Hyperlipidemia, unspecified: Secondary | ICD-10-CM | POA: Diagnosis present

## 2024-04-14 DIAGNOSIS — Z888 Allergy status to other drugs, medicaments and biological substances status: Secondary | ICD-10-CM

## 2024-04-14 DIAGNOSIS — Z79899 Other long term (current) drug therapy: Secondary | ICD-10-CM | POA: Diagnosis not present

## 2024-04-14 DIAGNOSIS — F0390 Unspecified dementia without behavioral disturbance: Secondary | ICD-10-CM | POA: Diagnosis present

## 2024-04-14 DIAGNOSIS — A419 Sepsis, unspecified organism: Secondary | ICD-10-CM | POA: Diagnosis present

## 2024-04-14 DIAGNOSIS — M069 Rheumatoid arthritis, unspecified: Secondary | ICD-10-CM | POA: Diagnosis present

## 2024-04-14 DIAGNOSIS — B3731 Acute candidiasis of vulva and vagina: Secondary | ICD-10-CM | POA: Insufficient documentation

## 2024-04-14 DIAGNOSIS — J984 Other disorders of lung: Secondary | ICD-10-CM | POA: Diagnosis present

## 2024-04-14 DIAGNOSIS — E559 Vitamin D deficiency, unspecified: Secondary | ICD-10-CM | POA: Insufficient documentation

## 2024-04-14 DIAGNOSIS — G9340 Encephalopathy, unspecified: Secondary | ICD-10-CM | POA: Diagnosis not present

## 2024-04-14 DIAGNOSIS — I2510 Atherosclerotic heart disease of native coronary artery without angina pectoris: Secondary | ICD-10-CM | POA: Diagnosis present

## 2024-04-14 DIAGNOSIS — E860 Dehydration: Secondary | ICD-10-CM | POA: Diagnosis present

## 2024-04-14 DIAGNOSIS — Z23 Encounter for immunization: Secondary | ICD-10-CM

## 2024-04-14 DIAGNOSIS — R059 Cough, unspecified: Secondary | ICD-10-CM | POA: Diagnosis not present

## 2024-04-14 DIAGNOSIS — N390 Urinary tract infection, site not specified: Secondary | ICD-10-CM | POA: Diagnosis present

## 2024-04-14 DIAGNOSIS — M47816 Spondylosis without myelopathy or radiculopathy, lumbar region: Secondary | ICD-10-CM | POA: Insufficient documentation

## 2024-04-14 DIAGNOSIS — R413 Other amnesia: Secondary | ICD-10-CM | POA: Diagnosis present

## 2024-04-14 LAB — CBC WITH DIFFERENTIAL/PLATELET
Abs Immature Granulocytes: 0.03 K/uL (ref 0.00–0.07)
Basophils Absolute: 0 K/uL (ref 0.0–0.1)
Basophils Relative: 1 %
Eosinophils Absolute: 0 K/uL (ref 0.0–0.5)
Eosinophils Relative: 1 %
HCT: 39 % (ref 36.0–46.0)
Hemoglobin: 12.1 g/dL (ref 12.0–15.0)
Immature Granulocytes: 0 %
Lymphocytes Relative: 30 %
Lymphs Abs: 2.1 K/uL (ref 0.7–4.0)
MCH: 28.1 pg (ref 26.0–34.0)
MCHC: 31 g/dL (ref 30.0–36.0)
MCV: 90.5 fL (ref 80.0–100.0)
Monocytes Absolute: 0.9 K/uL (ref 0.1–1.0)
Monocytes Relative: 13 %
Neutro Abs: 3.9 K/uL (ref 1.7–7.7)
Neutrophils Relative %: 55 %
Platelets: 121 K/uL — ABNORMAL LOW (ref 150–400)
RBC: 4.31 MIL/uL (ref 3.87–5.11)
RDW: 13 % (ref 11.5–15.5)
WBC: 6.9 K/uL (ref 4.0–10.5)
nRBC: 0 % (ref 0.0–0.2)

## 2024-04-14 LAB — COMPREHENSIVE METABOLIC PANEL WITH GFR
ALT: 19 U/L (ref 0–44)
AST: 41 U/L (ref 15–41)
Albumin: 3.6 g/dL (ref 3.5–5.0)
Alkaline Phosphatase: 51 U/L (ref 38–126)
Anion gap: 12 (ref 5–15)
BUN: 12 mg/dL (ref 8–23)
CO2: 26 mmol/L (ref 22–32)
Calcium: 9.1 mg/dL (ref 8.9–10.3)
Chloride: 102 mmol/L (ref 98–111)
Creatinine, Ser: 0.83 mg/dL (ref 0.44–1.00)
GFR, Estimated: >60 mL/min (ref >60)
Glucose, Bld: 91 mg/dL (ref 70–99)
Potassium: 4.1 mmol/L (ref 3.5–5.1)
Sodium: 139 mmol/L (ref 135–145)
Total Bilirubin: 0.8 mg/dL (ref 0.0–1.2)
Total Protein: 7.7 g/dL (ref 6.5–8.1)

## 2024-04-14 LAB — RESP PANEL BY RT-PCR (RSV, FLU A&B, COVID)  RVPGX2
Influenza A by PCR: NEGATIVE
Influenza B by PCR: NEGATIVE
Resp Syncytial Virus by PCR: NEGATIVE
SARS Coronavirus 2 by RT PCR: NEGATIVE

## 2024-04-14 LAB — URINALYSIS, ROUTINE W REFLEX MICROSCOPIC
Bilirubin Urine: NEGATIVE
Glucose, UA: NEGATIVE mg/dL
Hgb urine dipstick: NEGATIVE
Ketones, ur: NEGATIVE mg/dL
Nitrite: POSITIVE — AB
Protein, ur: NEGATIVE mg/dL
Specific Gravity, Urine: 1.015 (ref 1.005–1.030)
pH: 5 (ref 5.0–8.0)

## 2024-04-14 LAB — TROPONIN T, HIGH SENSITIVITY
Troponin T High Sensitivity: 45 ng/L — ABNORMAL HIGH (ref 0–19)
Troponin T High Sensitivity: 41 ng/L — ABNORMAL HIGH (ref 0–19)

## 2024-04-14 LAB — LACTIC ACID, PLASMA: Lactic Acid, Venous: 2.1 mmol/L (ref 0.5–1.9)

## 2024-04-14 MED ORDER — ENOXAPARIN SODIUM 30 MG/0.3ML IJ SOSY
30.0000 mg | PREFILLED_SYRINGE | INTRAMUSCULAR | Status: DC
  Administered 2024-04-14 – 2024-04-15 (×2): 30 mg via SUBCUTANEOUS
  Filled 2024-04-14 (×2): qty 0.3

## 2024-04-14 MED ORDER — ACETAMINOPHEN 650 MG RE SUPP: 650.0000 mg | Freq: Four times a day (QID) | RECTAL | Status: DC | PRN

## 2024-04-14 MED ORDER — ACETAMINOPHEN 325 MG PO TABS
650.0000 mg | ORAL_TABLET | Freq: Four times a day (QID) | ORAL | Status: DC | PRN
  Administered 2024-04-14: 650 mg via ORAL
  Filled 2024-04-14: qty 2

## 2024-04-14 MED ORDER — SODIUM CHLORIDE 0.9 % IV SOLN
1.0000 g | Freq: Once | INTRAVENOUS | Status: AC
  Administered 2024-04-14: 1 g via INTRAVENOUS
  Filled 2024-04-14: qty 10

## 2024-04-14 MED ORDER — LACTATED RINGERS IV BOLUS
1000.0000 mL | Freq: Once | INTRAVENOUS | Status: AC
  Administered 2024-04-14: 1000 mL via INTRAVENOUS

## 2024-04-14 MED ORDER — ONDANSETRON HCL 4 MG/2ML IJ SOLN
4.0000 mg | Freq: Four times a day (QID) | INTRAMUSCULAR | Status: DC | PRN
  Administered 2024-04-15: 4 mg via INTRAVENOUS
  Filled 2024-04-14: qty 2

## 2024-04-14 MED ORDER — SODIUM CHLORIDE 0.9 % IV SOLN
1.0000 g | INTRAVENOUS | Status: DC
  Administered 2024-04-15 – 2024-04-16 (×2): 1 g via INTRAVENOUS
  Filled 2024-04-14 (×2): qty 10

## 2024-04-14 MED ORDER — INFLUENZA VAC SPLIT HIGH-DOSE 0.5 ML IM SUSY
0.5000 mL | PREFILLED_SYRINGE | INTRAMUSCULAR | Status: AC
  Administered 2024-04-16: 0.5 mL via INTRAMUSCULAR
  Filled 2024-04-14: qty 0.5

## 2024-04-14 MED ORDER — ONDANSETRON HCL 4 MG PO TABS: 4.0000 mg | ORAL_TABLET | Freq: Four times a day (QID) | ORAL | Status: DC | PRN

## 2024-04-14 NOTE — TOC Initial Note (Signed)
 Transition of Care [facility]) - Initial/Assessment Note    Patient Details  Name: Ashley Willis MRN: [id number] Date of Birth: 1927/05/29  Transition of Care [facility]) CM/SW Contact:    Ashley Manuella Quill, RN Phone Number: 04/14/2024, 6:06 PM  Clinical Narrative:                 Pt asleep; spoke w/ pt's dtr Ashley Willis [phone]) and family at bedside; she said pt lives at home and has 24/7 family/friends that provide care; she plans for pt to return at d/c; [name] will provide transportation; she verified insurance/PCP; she also denied pt experiencing SDOH risks; pt has walker; she said pt does not have HH services or home oxygen; IP CM will follow. Expected Discharge Plan: Home/Self Care Barriers to Discharge: Continued Medical Work up   Patient Goals and CMS Choice Patient states their goals for this hospitalization and ongoing recovery are:: pt will return home per her dtr Ashley Willis          Expected Discharge Plan and Services   Discharge Planning Services: CM Consult   Living arrangements for the past 2 months: Single Family Home                 DME Arranged: N/A DME Agency: NA       HH Arranged: NA HH Agency: NA        Prior Living Arrangements/Services Living arrangements for the past 2 months: Single Family Home Lives with:: Self Patient language and need for interpreter reviewed:: Yes Do you feel safe going back to the place where you live?: Yes      Need for Family Participation in Patient Care: Yes (Comment) Care giver support system in place?: Yes (comment) Current home services: DME (walker) Criminal Activity/Legal Involvement Pertinent to Current Situation/Hospitalization: No - Comment as needed  Activities of Daily Living      Permission Sought/Granted Permission sought to share information with : Case Manager Permission granted to share information with : Yes, Verbal Permission Granted  Share Information with NAME: Case  Manager     Permission granted to share info w Relationship: Ashley Willis (dtr) [phone]     Emotional Assessment Appearance:: Appears stated age Attitude/Demeanor/Rapport: Unable to Assess Affect (typically observed): Unable to Assess Orientation: :  (unable to assess) Alcohol / Substance Use: Not Applicable Psych Involvement: No (comment)  Admission diagnosis:  Acute cystitis without hematuria [N30.00] Acute encephalopathy [G93.40] Acute metabolic encephalopathy [G93.41] Patient Active Problem List   Diagnosis Date Noted   Dyslipidemia 04/14/2024   Vitamin D deficiency 04/14/2024   Thrombocytopenia 04/14/2024   Lumbar spondylosis 04/14/2024   Vulvar candidiasis 04/14/2024   Restrictive lung disease 04/14/2024   Acute metabolic encephalopathy 04/14/2024   Impacted cerumen of both ears 06/07/2023   Fall at home, initial encounter 02/14/2023   Spell of altered consciousness 02/14/2023   Paronychia of great toe of right foot 12/14/2022   Dementia without behavioral disturbance (HCC) 11/07/2022   Generalized weakness 11/07/2022   Chronic cough 11/07/2022   Left upper lobe pulmonary nodule 11/07/2022   OSTEOPOROSIS 10/09/2009   VITAMIN B12 DEFICIENCY 02/25/2009   GALLSTONES 08/28/2008   MEMORY LOSS 08/28/2008   ANEMIA, IRON DEFICIENCY 08/27/2008   ESOPHAGEAL STRICTURE 08/27/2008   GERD 08/27/2008   HIATAL HERNIA 08/27/2008   DIVERTICULOSIS, COLON 08/27/2008   EARLY SATIETY 08/27/2008   HYPERLIPIDEMIA 05/13/2007   HYPERTENSION 05/13/2007   CORONARY ARTERY DISEASE 05/13/2007   RHEUMATOID ARTHRITIS 05/13/2007   INSOMNIA 05/13/2007  PANCREATITIS, HX OF 05/13/2007   History of colonic polyps 05/13/2007   POLYPECTOMY, HX OF 05/13/2007   TAH/BSO, HX OF 05/13/2007   PCP:  Ashley Cole, MD Pharmacy:   [facility] [phone] [address], [place] - [address] [place] [address] Phone: [phone] Fax: [phone]  [facility] - [place], [place] - [address] [place] [address] Phone: [phone] Fax: [phone]     Social Drivers of Health (SDOH) Social History: SDOH Screenings   Food Insecurity: No Food Insecurity (04/14/2024)  Housing: Low Risk  (04/14/2024)  Transportation Needs: No Transportation Needs (04/14/2024)  Utilities: Not At Risk (04/14/2024)  Tobacco Use: Low Risk  (04/14/2024)   SDOH Interventions: Food Insecurity Interventions: Intervention Not Indicated, Inpatient TOC Housing Interventions: Intervention Not Indicated, Inpatient TOC Transportation Interventions: Intervention Not Indicated, Inpatient TOC Utilities Interventions: Intervention Not Indicated, Inpatient TOC   Readmission Risk Interventions     No data to display

## 2024-04-14 NOTE — H&P (Signed)
 History and Physical    Patient: Ashley Willis FMW:[id number] DOB: Jun 04, 1927 DOA: 04/14/2024 DOS: the patient was seen and examined on 04/14/2024 PCP: Leonel Cole, MD  Patient coming from: Home  Chief Complaint:  Chief Complaint  Patient presents with   Altered Mental Status   Fatigue   HPI: Ashley Willis is a 89 y.o. female with medical history significant of anemia, CAD,, cholelithiasis, diverticulosis, GERD, hiatal hernia, stricture and stenosis of esophagus, hyperlipidemia, hypertension, insomnia, memory loss, osteoporosis, pancreatitis, tubular adenomas, rheumatoid arthritis, vitamin B12 deficiency who was brought to the emergency department due to altered mental status and fatigue.  She has improved after IV fluids and ceftriaxone , but is unable to elaborate.  Oriented to name, to place with some difficulty, but disoriented to situation, time and date.  She is able to answer simple questions.  No headache, chest, back or abdominal pain.  Her caregiver that was at bedside stated that she was still lethargic yesterday evening, but still interactive.  This morning she was very difficult to arouse and febrile.  She has a history of UTIs with similar clinical picture.  Lab work: Urinalysis showed positive nitrites, small leukocyte esterase, 11-20 WBC and a few bacteria.  Negative coronavirus, influenza and RSV PCR.  CBC showed a white count of 6.9, hemoglobin 12.1 g/dL and platelets 878.  First troponin was 49 ng/L.  CMP was normal.  Lactic acid was 2.1 mmol/L.  Imaging: Portable 1 view chest radiograph with low lung volumes with crowding of the pulmonary vasculature and minimal scarring at the lung bases.  Bilateral glenohumeral arthritis.  Aortic atherosclerosis.   ED course: 100.5 F, pulse 73, respiration 26, BP 154/62 mmHg and O2 sat 95% on room air.  The patient received ceftriaxone  1 g IVPB and 1000 mL of normal saline bolus.  Review of Systems: As mentioned in the history of  present illness. All other systems reviewed and are negative. Past Medical History:  Diagnosis Date   Anemia    CAD (coronary artery disease) 07/14/2006   ECHO - EF >55%; borderline concentric LVH; LA mildly dilated, mild tricuspid regurgitation   CAD (coronary artery disease) 08/27/2010   R/P MV - LV normal in size, fixed mid-basal inferior defect is seen, in absence of gated images not possible to distinguish diaphragmatic atenuation from nontransmural infarction; no significant ischemia detected   Calculus of gallbladder without mention of cholecystitis or obstruction    Diverticulosis of colon (without mention of hemorrhage)    Esophageal reflux    Hiatal hernia    Hyperlipemia    Hypertension    Insomnia, unspecified    Memory loss    MI 08/27/2008   Qualifier: Diagnosis of   By: Kowalk CMA (AAMA), Chick      Replacing diagnoses that were inactivated after the 09/14/22 regulatory import     Myocardial infarct (HCC) 06/16/1987   inferior wall MI - total occlusionof distal RCA beyond PDA branch, treated w/ medical therapy   Osteoporosis, unspecified    Other symptoms involving abdomen and pelvis(789.9)    Pancreatitis    Personal history of colonic polyps 03/03/2010   tubular adenomas   Rheumatoid arthritis(714.0)    Stricture and stenosis of esophagus    Vitamin B12 deficiency    Past Surgical History:  Procedure Laterality Date   ANGIOPLASTY     APPENDECTOMY     AXILLARY SURGERY     BREAST BIOPSY Right 05/2006   CHOLECYSTECTOMY     Social History:  reports that  she has never smoked. She has never used smokeless tobacco. She reports that she does not drink alcohol and does not use drugs.  Allergies  Allergen Reactions   Codeine    Donepezil     Other Reaction(s): fatigue, agitation    Family History  Problem Relation Age of Onset   Diabetes Mother    Colon cancer Neg Hx     Prior to Admission medications   Medication Sig Start Date End Date Taking? Authorizing  Provider  cetirizine (ZYRTEC) 10 MG chewable tablet Chew 10 mg by mouth daily.    [provider]  cholecalciferol (VITAMIN D) 1000 UNITS tablet Take 2,000 Units by mouth daily.    [provider]  dextromethorphan  15 MG/5ML syrup Take 10 mLs (30 mg total) by mouth 4 (four) times daily as needed for cough. 10/31/21   Gladis Leonor HERO, MD  fluticasone  (FLONASE ) 50 MCG/ACT nasal spray Place 1 spray into both nostrils daily. 10/31/21   Gladis Leonor HERO, MD  ipratropium-albuterol  (DUONEB) 0.5-2.5 (3) MG/3ML SOLN Inhale 3 mLs into the lungs every 6 (six) hours as needed (For shortness of breath). 07/28/22   [provider]    Physical Exam: Vitals:   04/14/24 0547 04/14/24 0551 04/14/24 0556 04/14/24 0741  BP:  (!) 154/62  135/69  Pulse:  73  76  Resp:  (!) 26  (!) 24  Temp:   (!) 100.5 F (38.1 C) 97.6 F (36.4 C)  TempSrc:   Rectal Oral  SpO2:  95%  97%  Weight: 44.9 kg     Height: 5' 2 (1.575 m)      Physical Exam Vitals and nursing note reviewed.  Constitutional:      General: She is awake. She is not in acute distress.    Appearance: Normal appearance. She is ill-appearing.  HENT:     Head: Normocephalic.     Nose: No rhinorrhea.     Mouth/Throat:     Mouth: Mucous membranes are moist.  Eyes:     General: No scleral icterus.    Pupils: Pupils are equal, round, and reactive to light.  Neck:     Vascular: No JVD.  Cardiovascular:     Rate and Rhythm: Normal rate and regular rhythm.     Heart sounds: S1 normal and S2 normal.  Pulmonary:     Effort: Pulmonary effort is normal.     Breath sounds: Normal breath sounds. No wheezing, rhonchi or rales.  Abdominal:     General: Bowel sounds are normal. There is no distension.     Palpations: Abdomen is soft.     Tenderness: There is no right CVA tenderness or left CVA tenderness.  Musculoskeletal:     Cervical back: Neck supple.     Right lower leg: No edema.     Left lower leg: No edema.  Skin:     General: Skin is warm and dry.  Neurological:     General: No focal deficit present.     Mental Status: She is alert and oriented to person, place, and time.  Psychiatric:        Mood and Affect: Mood normal.        Behavior: Behavior normal. Behavior is cooperative.     Data Reviewed:  Results are pending, will review when available. 02/04/2021 transthoracic echocardiogram report. IMPRESSIONS:   1. Left ventricular ejection fraction, by estimation, is 55 to 60%. The  left ventricle has normal function. The left ventricle has no  regional  wall motion abnormalities. There is mild concentric left ventricular  hypertrophy. Left ventricular diastolic  parameters are indeterminate.   2. Right ventricular systolic function is normal. The right ventricular  size is normal. Tricuspid regurgitation signal is inadequate for assessing  PA pressure.   3. Left atrial size was mildly dilated.   4. The mitral valve is normal in structure. Trivial mitral valve  regurgitation. No evidence of mitral stenosis.   5. The aortic valve is tricuspid. There is mild calcification of the  aortic valve. Aortic valve regurgitation is trivial. No aortic stenosis is  present.   6. The inferior vena cava is normal in size with greater than 50%  respiratory variability, suggesting right atrial pressure of 3 mmHg.   Comparison(s): Prior images unable to be directly viewed, comparison made  by report only. No significant change from prior study.   EKG: Vent. rate 76 BPM PR interval 188 ms QRS duration 105 ms QT/QTcB 480/540 ms P-R-T axes 67 1 89 Sinus rhythm Nonspecific T abnormalities, lateral leads Prolonged QT interval  Assessment and Plan: Principal Problem:   Acute metabolic encephalopathy In the setting of:   Sepsis secondary to UTI (HCC) Admit to telemetry/inpatient. Continue ceftriaxone  1 g IVPB daily. Follow-up urine culture and sensitivity. Follow-up CBC and chemistry in the  morning.  Active Problems:   HYPERTENSION   Dyslipidemia Currently not on therapy. BP improved with weight loss. Follow-up with PCP.    Memory loss Supportive care. Not taking any medications.    Thrombocytopenia Stable. Follow platelet count.    GERD Antiacid, H2 blocker or PPI as needed.    Restrictive lung disease Bronchodilators as needed.    Elevated troponin No complaints of chest pain. Awaiting for second troponin level.    Advance Care Planning:   Code Status: Full Code   Consults:   Family Communication:   Severity of Illness: The appropriate patient status for this patient is INPATIENT. Inpatient status is judged to be reasonable and necessary in order to provide the required intensity of service to ensure the patient's safety. The patient's presenting symptoms, physical exam findings, and initial radiographic and laboratory data in the context of their chronic comorbidities is felt to place them at high risk for further clinical deterioration. Furthermore, it is not anticipated that the patient will be medically stable for discharge from the hospital within 2 midnights of admission.   * I certify that at the point of admission it is my clinical judgment that the patient will require inpatient hospital care spanning beyond 2 midnights from the point of admission due to high intensity of service, high risk for further deterioration and high frequency of surveillance required.*  Author: Alm Dorn Castor, MD 04/14/2024 9:43 AM  For on call review [email or web address].   This document was prepared using Dragon voice recognition software and may contain some unintended transcription errors.

## 2024-04-14 NOTE — Plan of Care (Signed)
  Problem: Coping: Goal: Level of anxiety will decrease Outcome: Progressing   Problem: Pain Managment: Goal: General experience of comfort will improve and/or be controlled Outcome: Progressing   Problem: Safety: Goal: Ability to remain free from injury will improve Outcome: Progressing   Problem: Skin Integrity: Goal: Risk for impaired skin integrity will decrease Outcome: Progressing

## 2024-04-14 NOTE — ED Provider Notes (Signed)
 [place] EMERGENCY DEPARTMENT AT [facility] Provider Note   CSN: [id number] Arrival date & time: 04/14/24  9460     Patient presents with: Altered Mental Status and Fatigue   Akima Slaugh is a 89 y.o. female.   Level 5 caveat for dementia and altered mental status.  Patient brought in by EMS with confusion lethargy, fatigue and not acting herself for the past 2 days.  This is a change from her baseline.  Daughter concern for UTI.  No known fever but 99.8 for EMS.  Tachypneic and tachycardic for EMS but this has improved.  Initially was hypoxic but this appeared to be positional.  Patient oriented to person only.  Denies any pain.  She has asthma and is mostly nonambulatory. Denies headache, neck pain, chest pain, abdominal pain, nausea or vomiting.  The history is provided by the EMS personnel and the patient. The history is limited by the condition of the patient.  Altered Mental Status      Prior to Admission medications   Medication Sig Start Date End Date Taking? Authorizing Provider  cetirizine (ZYRTEC) 10 MG chewable tablet Chew 10 mg by mouth daily.    [provider]  cholecalciferol (VITAMIN D) 1000 UNITS tablet Take 2,000 Units by mouth daily.    [provider]  dextromethorphan  15 MG/5ML syrup Take 10 mLs (30 mg total) by mouth 4 (four) times daily as needed for cough. 10/31/21   Gladis Leonor HERO, MD  fluticasone  (FLONASE ) 50 MCG/ACT nasal spray Place 1 spray into both nostrils daily. 10/31/21   Gladis Leonor HERO, MD  ipratropium-albuterol  (DUONEB) 0.5-2.5 (3) MG/3ML SOLN Inhale 3 mLs into the lungs every 6 (six) hours as needed (For shortness of breath). 07/28/22   [provider]    Allergies: Codeine and Donepezil    Review of Systems  Unable to perform ROS: Dementia    Updated Vital Signs BP (!) 154/62 (BP Location: Left Arm)   Pulse 73   Resp (!) 26   Ht 5' 2 (1.575 m)   Wt 44.9 kg   SpO2 95%   BMI 18.10  kg/m   Physical Exam Vitals and nursing note reviewed.  Constitutional:      General: She is not in acute distress.    Appearance: She is well-developed.     Comments: Chronically ill-appearing  HENT:     Head: Normocephalic and atraumatic.     Mouth/Throat:     Mouth: Mucous membranes are dry.     Pharynx: No oropharyngeal exudate.  Eyes:     Conjunctiva/sclera: Conjunctivae normal.     Pupils: Pupils are equal, round, and reactive to light.  Neck:     Comments: No meningismus. Cardiovascular:     Rate and Rhythm: Normal rate and regular rhythm.     Heart sounds: Normal heart sounds. No murmur heard. Pulmonary:     Effort: Pulmonary effort is normal. No respiratory distress.     Breath sounds: Normal breath sounds.  Abdominal:     Palpations: Abdomen is soft.     Tenderness: There is no abdominal tenderness. There is no guarding or rebound.  Musculoskeletal:        General: No tenderness. Normal range of motion.     Cervical back: Normal range of motion and neck supple.  Skin:    General: Skin is warm.  Neurological:     Mental Status: She is alert.     Motor: No abnormal muscle tone.  Comments: Oriented to person only.  Poor effort on formal neurological testing but moving all extremities equally.  No facial droop  Psychiatric:        Behavior: Behavior normal.     (all labs ordered are listed, but only abnormal results are displayed) Labs Reviewed  URINALYSIS, ROUTINE W REFLEX MICROSCOPIC - Abnormal; Notable for the following components:      Result Value   APPearance HAZY (*)    Nitrite POSITIVE (*)    Leukocytes,Ua SMALL (*)    Bacteria, UA FEW (*)    All other components within normal limits  CULTURE, BLOOD (ROUTINE X 2)  CULTURE, BLOOD (ROUTINE X 2)  CBC WITH DIFFERENTIAL/PLATELET  COMPREHENSIVE METABOLIC PANEL WITH GFR  LACTIC ACID, PLASMA  LACTIC ACID, PLASMA  TROPONIN T, HIGH SENSITIVITY    EKG: EKG Interpretation Date/Time:  Friday April 14 2024 06:03:39 EDT Ventricular Rate:  76 PR Interval:  188 QRS Duration:  105 QT Interval:  480 QTC Calculation: 540 R Axis:   1  Text Interpretation: Sinus rhythm Nonspecific T abnormalities, lateral leads Prolonged QT interval No significant change was found Confirmed by Carita Senior [id number]) on 04/14/2024 6:12:41 AM  Radiology: No results found.   Procedures   Medications Ordered in the ED - No data to display                                  Medical Decision Making Amount and/or Complexity of Data Reviewed Independent Historian: EMS Labs: ordered. Decision-making details documented in ED Course. Radiology: ordered and independent interpretation performed. Decision-making details documented in ED Course. ECG/medicine tests: ordered and independent interpretation performed. Decision-making details documented in ED Course.   Dementia patient presents by EMS with change in mental status over the past 2 days.  Tachypneic and hypoxic on arrival.  Concern for sepsis.  Infectious workup pursued.  Febrile and tachypneic on arrival.  Infectious workup pursued, cultures were obtained.  IV fluids given.  EKG without acute ischemia but does show prolonged QT.  Patient given IV fluids.  Labs are pending at time of shift change.  Blood cultures to be obtained.  Does have positive UTI which may explain part of her encephalopathy.  Remainder of laboratory workup and chest x-ray pending.  Likely require admission due to encephalopathy in the setting of UTI.     Final diagnoses:  None    ED Discharge Orders     None          Marland Reine, Senior, MD 04/14/24 [phone]

## 2024-04-14 NOTE — ED Triage Notes (Signed)
 Pt BIB [facility] from home after being hard to arouse, AMS, lethargy x 2 days. Not following commands well- not normal per daughter. Hx frequent UTIs.   BP 130/50 HR 70 T 99.8 RR 30 GCS 11 ETC02 26 CBG 87 20 LFA- 200 LR

## 2024-04-14 NOTE — ED Provider Notes (Signed)
  Physical Exam  BP (!) 154/62 (BP Location: Left Arm)   Pulse 73   Temp (!) 100.5 F (38.1 C) (Rectal)   Resp (!) 26   Ht 5' 2 (1.575 m)   Wt 44.9 kg   SpO2 95%   BMI 18.10 kg/m   Physical Exam  Procedures  Procedures  ED Course / MDM    Medical Decision Making Amount and/or Complexity of Data Reviewed Labs: ordered. Radiology: ordered. ECG/medicine tests: ordered.   Assuming care of this patient at 7:20 AM.  Patient has history of diverticulitis.  She comes from home with fevers, altered mental status.  Sepsis workup initiated.  Urinalysis reveals findings that are consistent with UTI.  Will likely need admission to the hospital.  Rest of the labs are pending.  9:05 AM Talk to the family.  Patient's labs are reassuring.  They state that patient started getting sick about 3 days ago.  Today she was dead weight and not moving.  Patient has not been eating well since yesterday as well.  She has also been more confused.  It appears that she likely has UTI, but I will add COVID swab as well.  She will need admission to the hospital.    Charlyn Sora, MD 04/14/24 [phone]

## 2024-04-15 DIAGNOSIS — G9341 Metabolic encephalopathy: Secondary | ICD-10-CM | POA: Diagnosis not present

## 2024-04-15 LAB — COMPREHENSIVE METABOLIC PANEL WITH GFR
ALT: 24 U/L (ref 0–44)
AST: 44 U/L — ABNORMAL HIGH (ref 15–41)
Albumin: 3.3 g/dL — ABNORMAL LOW (ref 3.5–5.0)
Alkaline Phosphatase: 52 U/L (ref 38–126)
Anion gap: 8 (ref 5–15)
BUN: 12 mg/dL (ref 8–23)
CO2: 26 mmol/L (ref 22–32)
Calcium: 8.9 mg/dL (ref 8.9–10.3)
Chloride: 102 mmol/L (ref 98–111)
Creatinine, Ser: 0.76 mg/dL (ref 0.44–1.00)
GFR, Estimated: >60 mL/min (ref >60)
Glucose, Bld: 79 mg/dL (ref 70–99)
Potassium: 3.5 mmol/L (ref 3.5–5.1)
Sodium: 136 mmol/L (ref 135–145)
Total Bilirubin: 0.8 mg/dL (ref 0.0–1.2)
Total Protein: 7.3 g/dL (ref 6.5–8.1)

## 2024-04-15 LAB — CBC
HCT: 38.8 % (ref 36.0–46.0)
Hemoglobin: 11.7 g/dL — ABNORMAL LOW (ref 12.0–15.0)
MCH: 27 pg (ref 26.0–34.0)
MCHC: 30.2 g/dL (ref 30.0–36.0)
MCV: 89.6 fL (ref 80.0–100.0)
Platelets: 125 K/uL — ABNORMAL LOW (ref 150–400)
RBC: 4.33 MIL/uL (ref 3.87–5.11)
RDW: 12.6 % (ref 11.5–15.5)
WBC: 4.5 K/uL (ref 4.0–10.5)
nRBC: 0 % (ref 0.0–0.2)

## 2024-04-15 MED ORDER — IPRATROPIUM-ALBUTEROL 0.5-2.5 (3) MG/3ML IN SOLN: 3.0000 mL | Freq: Four times a day (QID) | RESPIRATORY_TRACT | Status: DC | PRN

## 2024-04-15 MED ORDER — LACTATED RINGERS IV SOLN: INTRAVENOUS | Status: AC

## 2024-04-15 MED ORDER — FAMOTIDINE 20 MG PO TABS
20.0000 mg | ORAL_TABLET | Freq: Every day | ORAL | Status: DC
  Administered 2024-04-15: 20 mg via ORAL
  Filled 2024-04-15: qty 1

## 2024-04-15 MED ORDER — POTASSIUM CHLORIDE CRYS ER 20 MEQ PO TBCR
40.0000 meq | EXTENDED_RELEASE_TABLET | Freq: Once | ORAL | Status: AC
  Administered 2024-04-15: 40 meq via ORAL
  Filled 2024-04-15: qty 2

## 2024-04-15 NOTE — Plan of Care (Signed)

## 2024-04-15 NOTE — Progress Notes (Signed)
 PROGRESS NOTE    Ashley Willis  FMW:[id number] DOB: 1927-01-27 DOA: 04/14/2024 PCP: Leonel Cole, MD   Brief Narrative: 89 year old with past medical history significant for anemia, CAD, cholelithiasis, diverticulosis, GERD, hiatal hernia, stricture and stenosis of the esophagus, hyperlipidemia, hypertension, insomnia, memory loss, pancreatitis, tubular adenoma, rheumatoid arthritis, B12 deficiency who was brought to the ED with altered mental status and fatigue.  Patient has been lethargic since a day prior to admission.  In the ED she was found to have low-grade fever 101.5, UA positive for nitrates 11-20 white blood cell.  Patient admitted for dehydration, and UTI and acute metabolic encephalopathy.   Assessment & Plan:   Principal Problem:   Acute metabolic encephalopathy Active Problems:   HYPERTENSION   GERD   Memory loss   Dyslipidemia   Thrombocytopenia   Restrictive lung disease   Elevated troponin   Sepsis secondary to UTI (HCC)   1-Sepsis secondary to UTI, present on admission - Patient presented with fever, tachypnea, confusion, lactic acid 2.1, thrombocytopenia UA with 11-20 white blood cell, positive nitrates. - Follow blood cultures - Continue IV Ceftriaxone  - Follow urine culture:  Acute metabolic encephalopathy in the setting of UTI: -Patient presented with lethargic, fatigue since a day prior to admission. - Treat underlying infectious  Hypertension Hyperlipidemia: - Currently not on medication. - Follow-up with PCP  Memory loss - Check B12  Thrombocytopenia: - Check B12 and monitor  GERD: - Pepcid  Restrictive lung disease: - As needed nebulizers  Elevated troponin: - Denies chest pain   Estimated body mass index is 18.1 kg/m as calculated from the following:   Height as of this encounter: 5' 2 (1.575 m).   Weight as of this encounter: 44.9 kg.   DVT prophylaxis: Lovenox  Code Status: Full code Family Communication: Ashley Willis at  bedside.  Disposition Plan:  Status is: Inpatient Remains inpatient appropriate because: Management of infection and sepsis    Consultants:  None  Procedures:  none  Antimicrobials:    Subjective: She is alert, answer questions. Per Ashley Willis patient MS has improved getting close to baseline    Objective: Vitals:   04/14/24 1812 04/14/24 1842 04/14/24 2125 04/15/24 0521  BP: (!) 117/54  123/61 (!) 153/57  Pulse: 69  62 (!) 44  Resp: 16  15 20   Temp: 99.2 F (37.3 C) 99.1 F (37.3 C) 98 F (36.7 C) 98.3 F (36.8 C)  TempSrc:  Oral    SpO2: 97%  93% 92%  Weight:      Height:        Intake/Output Summary (Last 24 hours) at 04/15/2024 0740 Last data filed at 04/14/2024 2256 Gross per 24 hour  Intake 1153.55 ml  Output --  Net 1153.55 ml   Filed Weights   04/14/24 0547  Weight: 44.9 kg    Examination:  General exam: Appears calm and comfortable  Respiratory system: Clear to auscultation. Respiratory effort normal. Cardiovascular system: S1 & S2 heard, RRR. No JVD, murmurs, rubs, gallops or clicks. No pedal edema. Gastrointestinal system: Abdomen is nondistended, soft and nontender. No organomegaly or masses felt. Normal bowel sounds heard. Central nervous system: Alert and oriented.  Extremities: Symmetric 5 x 5 power.  Data Reviewed: I have personally reviewed following labs and imaging studies  CBC: Recent Labs  Lab 04/14/24 0622 04/15/24 0626  WBC 6.9 4.5  NEUTROABS 3.9  --   HGB 12.1 11.7*  HCT 39.0 38.8  MCV 90.5 89.6  PLT 121* 125*   Basic Metabolic  Panel: Recent Labs  Lab 04/14/24 0622 04/15/24 0626  NA 139 136  K 4.1 3.5  CL 102 102  CO2 26 26  GLUCOSE 91 79  BUN 12 12  CREATININE 0.83 0.76  CALCIUM  9.1 8.9   GFR: Estimated Creatinine Clearance: 29.8 mL/min (by C-G formula based on SCr of 0.76 mg/dL). Liver Function Tests: Recent Labs  Lab 04/14/24 0622 04/15/24 0626  AST 41 44*  ALT 19 24  ALKPHOS 51 52  BILITOT 0.8 0.8   PROT 7.7 7.3  ALBUMIN 3.6 3.3*   No results for input(s): LIPASE, AMYLASE in the last 168 hours. No results for input(s): AMMONIA in the last 168 hours. Coagulation Profile: No results for input(s): INR, PROTIME in the last 168 hours. Cardiac Enzymes: No results for input(s): CKTOTAL, CKMB, CKMBINDEX, TROPONINI in the last 168 hours. BNP (last 3 results) No results for input(s): PROBNP in the last 8760 hours. HbA1C: No results for input(s): HGBA1C in the last 72 hours. CBG: No results for input(s): GLUCAP in the last 168 hours. Lipid Profile: No results for input(s): CHOL, HDL, LDLCALC, TRIG, CHOLHDL, LDLDIRECT in the last 72 hours. Thyroid  Function Tests: No results for input(s): TSH, T4TOTAL, FREET4, T3FREE, THYROIDAB in the last 72 hours. Anemia Panel: No results for input(s): VITAMINB12, FOLATE, FERRITIN, TIBC, IRON, RETICCTPCT in the last 72 hours. Sepsis Labs: Recent Labs  Lab 04/14/24 0622  LATICACIDVEN 2.1*    Recent Results (from the past 240 hours)  Resp panel by RT-PCR (RSV, Flu A&B, Covid) Anterior Nasal Swab     Status: None   Collection Time: 04/14/24 10:36 AM   Specimen: Anterior Nasal Swab  Result Value Ref Range Status   SARS Coronavirus 2 by RT PCR NEGATIVE NEGATIVE Final    Comment: (NOTE) SARS-CoV-2 target nucleic acids are NOT DETECTED.  The SARS-CoV-2 RNA is generally detectable in upper respiratory specimens during the acute phase of infection. The lowest concentration of SARS-CoV-2 viral copies this assay can detect is 138 copies/mL. A negative result does not preclude SARS-Cov-2 infection and should not be used as the sole basis for treatment or other patient management decisions. A negative result may occur with  improper specimen collection/handling, submission of specimen other than nasopharyngeal swab, presence of viral mutation(s) within the areas targeted by this assay, and  inadequate number of viral copies(<138 copies/mL). A negative result must be combined with clinical observations, patient history, and epidemiological information. The expected result is Negative.  Fact Sheet for Patients:  bloggercourse.com  Fact Sheet for Healthcare Providers:  seriousbroker.it  This test is no t yet approved or cleared by the [place]  FDA and  has been authorized for detection and/or diagnosis of SARS-CoV-2 by FDA under an Emergency Use Authorization (EUA). This EUA will remain  in effect (meaning this test can be used) for the duration of the COVID-19 declaration under Section 564(b)(1) of the Act, 21 U.S.C.section 360bbb-3(b)(1), unless the authorization is terminated  or revoked sooner.       Influenza A by PCR NEGATIVE NEGATIVE Final   Influenza B by PCR NEGATIVE NEGATIVE Final    Comment: (NOTE) The Xpert Xpress SARS-CoV-2/FLU/RSV plus assay is intended as an aid in the diagnosis of influenza from Nasopharyngeal swab specimens and should not be used as a sole basis for treatment. Nasal washings and aspirates are unacceptable for Xpert Xpress SARS-CoV-2/FLU/RSV testing.  Fact Sheet for Patients: bloggercourse.com  Fact Sheet for Healthcare Providers: seriousbroker.it  This test is not yet approved or  cleared by the [place]  FDA and has been authorized for detection and/or diagnosis of SARS-CoV-2 by FDA under an Emergency Use Authorization (EUA). This EUA will remain in effect (meaning this test can be used) for the duration of the COVID-19 declaration under Section 564(b)(1) of the Act, 21 U.S.C. section 360bbb-3(b)(1), unless the authorization is terminated or revoked.     Resp Syncytial Virus by PCR NEGATIVE NEGATIVE Final    Comment: (NOTE) Fact Sheet for Patients: bloggercourse.com  Fact Sheet for Healthcare  Providers: seriousbroker.it  This test is not yet approved or cleared by the [place]  FDA and has been authorized for detection and/or diagnosis of SARS-CoV-2 by FDA under an Emergency Use Authorization (EUA). This EUA will remain in effect (meaning this test can be used) for the duration of the COVID-19 declaration under Section 564(b)(1) of the Act, 21 U.S.C. section 360bbb-3(b)(1), unless the authorization is terminated or revoked.  Performed at [facility], [address] [place], [place] [address]          Radiology Studies: DG Chest Portable 1 View Result Date: 04/14/2024 EXAM: 1 VIEW(S) XRAY OF THE CHEST 04/14/2024 08:18:00 AM COMPARISON: 11/07/2022 CLINICAL HISTORY: ams FINDINGS: LUNGS AND PLEURA: Low lung volumes are present, causing crowding of the pulmonary vasculature. Minimal scarring at the lung bases. No focal pulmonary opacity. No pulmonary edema. No pleural effusion. No pneumothorax. HEART AND MEDIASTINUM: Aortic atherosclerosis. No acute abnormality of the cardiac silhouette. BONES AND SOFT TISSUES: Degenerative glenohumeral arthropathy bilaterally. No acute osseous abnormality. IMPRESSION: 1. Low lung volumes with crowding of the pulmonary vasculature and minimal scarring at the lung bases. 2. Degenerative glenohumeral arthropathy bilaterally. 3. Aortic atherosclerosis. Electronically signed by: Ryan Salvage MD 04/14/2024 09:16 AM EDT RP Workstation: [id number]   CT Head Wo Contrast Result Date: 04/14/2024 EXAM: CT HEAD WITHOUT CONTRAST 04/14/2024 08:29:37 AM TECHNIQUE: CT of the head was performed without the administration of intravenous contrast. Automated exposure control, iterative reconstruction, and/or weight based adjustment of the mA/kV was utilized to reduce the radiation dose to as low as reasonably achievable. COMPARISON: None available. CLINICAL HISTORY: Delirium FINDINGS: BRAIN AND VENTRICLES: No acute  hemorrhage. No evidence of acute infarct. No hydrocephalus. No extra-axial collection. No mass effect or midline shift. Similar patchy white marrow hypodensities, compatible with chronic microvascular ischemic disease. Similar cerebral atrophy. ORBITS: No acute abnormality. SINUSES: No acute abnormality. SOFT TISSUES AND SKULL: No acute soft tissue abnormality. No skull fracture. IMPRESSION: 1. No acute intracranial abnormality. Electronically signed by: Gilmore Molt MD 04/14/2024 08:34 AM EDT RP Workstation: [id number]        Scheduled Meds:  enoxaparin  (LOVENOX ) injection  30 mg Subcutaneous Q24H   Influenza vac split trivalent PF  0.5 mL Intramuscular Tomorrow-1000   Continuous Infusions:  cefTRIAXone  (ROCEPHIN )  IV       LOS: 1 day    Time spent: 35 minutes    Lathaniel Legate A Jamesha Ellsworth, MD [facility]   If 7PM-7AM, please contact night-coverage [email or web address]  04/15/2024, 7:40 AM

## 2024-04-16 DIAGNOSIS — G9341 Metabolic encephalopathy: Secondary | ICD-10-CM | POA: Diagnosis not present

## 2024-04-16 LAB — URINE CULTURE: Culture: >=100000 — AB

## 2024-04-16 LAB — VITAMIN B12: Vitamin B-12: 586 pg/mL (ref 180–914)

## 2024-04-16 MED ORDER — CEPHALEXIN 500 MG PO CAPS: 500.0000 mg | ORAL_CAPSULE | Freq: Three times a day (TID) | ORAL | 0 refills | Status: AC

## 2024-04-16 MED ORDER — CEPHALEXIN 500 MG PO CAPS: 500.0000 mg | ORAL_CAPSULE | Freq: Three times a day (TID) | ORAL | Status: DC

## 2024-04-16 NOTE — Discharge Summary (Signed)
 Physician Discharge Summary   Patient: Ashley Willis MRN: [id number] DOB: [birth year]  Admit date:     04/14/2024  Discharge date: 04/16/24  Discharge Physician: Owen DELENA Lore   PCP: Leonel Cole, MD   Recommendations at discharge:   Follow up for resolution of UTI.   Discharge Diagnoses: Principal Problem:   Acute metabolic encephalopathy Active Problems:   HYPERTENSION   GERD   Memory loss   Dyslipidemia   Thrombocytopenia   Restrictive lung disease   Elevated troponin   Sepsis secondary to UTI [facility] - )  Resolved Problems:   * No resolved hospital problems. *  Hospital Course: 89 year old with past medical history significant for anemia, CAD, cholelithiasis, diverticulosis, GERD, hiatal hernia, stricture and stenosis of the esophagus, hyperlipidemia, hypertension, insomnia, memory loss, pancreatitis, tubular adenoma, rheumatoid arthritis, B12 deficiency who was brought to the ED with altered mental status and fatigue.  Patient has been lethargic since a day prior to admission.  In the ED she was found to have low-grade fever 101.5, UA positive for nitrates 11-20 white blood cell.   Patient admitted for dehydration, and UTI and acute metabolic encephalopathy.    Assessment and Plan: 1-Sepsis secondary to UTI, present on admission - Patient presented with fever, tachypnea, confusion, lactic acid 2.1, thrombocytopenia UA with 11-20 white blood cell, positive nitrates. - Follow blood cultures - Treated  IV Ceftriaxone  for 2 days, discharge on Keflex for 4 days.  - Follow urine culture: grew E coli.    Acute metabolic encephalopathy in the setting of UTI: -Patient presented with lethargic, fatigue since a day prior to admission. - Treat underlying infectious -improved.   Hypertension Hyperlipidemia: - Currently not on medication. - Follow-up with PCP   Memory loss - B12 Normal.    Thrombocytopenia: - B 12 normal.    GERD: - Pepcid   Restrictive lung  disease: - As needed nebulizers   Elevated troponin: - Denies chest pain     Estimated body mass index is 18.1 kg/m as calculated from the following:   Height as of this encounter: 5' 2 (1.575 m).   Weight as of this encounter: 44.9 kg.         Consultants: None Procedures performed: none Disposition: Home Diet recommendation:  Discharge Diet Orders (From admission, onward)     Start     Ordered   04/16/24 0000  Diet - low sodium heart healthy        04/16/24 0909           Regular diet DISCHARGE MEDICATION: Allergies as of 04/16/2024       Reactions   Codeine    Donepezil    Other Reaction(s): fatigue, agitation        Medication List     TAKE these medications    cephALEXin 500 MG capsule Commonly known as: KEFLEX Take 1 capsule (500 mg total) by mouth every 8 (eight) hours for 4 days. Start taking on: April 17, 2024   cetirizine 10 MG chewable tablet Commonly known as: ZYRTEC Chew 10 mg by mouth daily.   cholecalciferol 1000 units tablet Commonly known as: VITAMIN D Take 2,000 Units by mouth daily.   fluticasone  50 MCG/ACT nasal spray Commonly known as: FLONASE  Place 1 spray into both nostrils daily.   ipratropium-albuterol  0.5-2.5 (3) MG/3ML Soln Commonly known as: DUONEB Inhale 3 mLs into the lungs every 6 (six) hours as needed (For shortness of breath).        Discharge Exam: [employer]  04/14/24 0547  Weight: 44.9 kg   General; NAD  Condition at discharge: stable  The results of significant diagnostics from this hospitalization (including imaging, microbiology, ancillary and laboratory) are listed below for reference.   Imaging Studies: DG Chest Portable 1 View Result Date: 04/14/2024 EXAM: 1 VIEW(S) XRAY OF THE CHEST 04/14/2024 08:18:00 AM COMPARISON: 11/07/2022 CLINICAL HISTORY: ams FINDINGS: LUNGS AND PLEURA: Low lung volumes are present, causing crowding of the pulmonary vasculature. Minimal scarring at the lung  bases. No focal pulmonary opacity. No pulmonary edema. No pleural effusion. No pneumothorax. HEART AND MEDIASTINUM: Aortic atherosclerosis. No acute abnormality of the cardiac silhouette. BONES AND SOFT TISSUES: Degenerative glenohumeral arthropathy bilaterally. No acute osseous abnormality. IMPRESSION: 1. Low lung volumes with crowding of the pulmonary vasculature and minimal scarring at the lung bases. 2. Degenerative glenohumeral arthropathy bilaterally. 3. Aortic atherosclerosis. Electronically signed by: Ryan Salvage MD 04/14/2024 09:16 AM EDT RP Workstation: [id number]   CT Head Wo Contrast Result Date: 04/14/2024 EXAM: CT HEAD WITHOUT CONTRAST 04/14/2024 08:29:37 AM TECHNIQUE: CT of the head was performed without the administration of intravenous contrast. Automated exposure control, iterative reconstruction, and/or weight based adjustment of the mA/kV was utilized to reduce the radiation dose to as low as reasonably achievable. COMPARISON: None available. CLINICAL HISTORY: Delirium FINDINGS: BRAIN AND VENTRICLES: No acute hemorrhage. No evidence of acute infarct. No hydrocephalus. No extra-axial collection. No mass effect or midline shift. Similar patchy white marrow hypodensities, compatible with chronic microvascular ischemic disease. Similar cerebral atrophy. ORBITS: No acute abnormality. SINUSES: No acute abnormality. SOFT TISSUES AND SKULL: No acute soft tissue abnormality. No skull fracture. IMPRESSION: 1. No acute intracranial abnormality. Electronically signed by: Gilmore Molt MD 04/14/2024 08:34 AM EDT RP Workstation: [id number]    Microbiology: Results for orders placed or performed during the hospital encounter of 04/14/24  Blood culture (routine x 2)     Status: None (Preliminary result)   Collection Time: 04/14/24  6:12 AM   Specimen: BLOOD  Result Value Ref Range Status   Specimen Description   Final    BLOOD RIGHT ANTECUBITAL Performed at [facility],  [address] [place], [place] [address]    Special Requests   Final    BOTTLES DRAWN AEROBIC AND ANAEROBIC Blood Culture adequate volume Performed at [facility], [address], [place], [place] [address]    Culture   Final    NO GROWTH 1 DAY Performed at [facility], [address], [place], [place] [address]    Report Status PENDING  Incomplete  Blood culture (routine x 2)     Status: None (Preliminary result)   Collection Time: 04/14/24  6:20 AM   Specimen: BLOOD  Result Value Ref Range Status   Specimen Description   Final    BLOOD BLOOD RIGHT FOREARM Performed at [facility], [address] [place], [place] [address]    Special Requests   Final    BOTTLES DRAWN AEROBIC ONLY Blood Culture adequate volume Performed at [facility], [address] [place], [place] [address]    Culture   Final    NO GROWTH 1 DAY Performed at [facility], [address], [place], [place] [address]    Report Status PENDING  Incomplete  Urine Culture     Status: Abnormal   Collection Time: 04/14/24  7:29 AM   Specimen: Urine, Clean Catch  Result Value Ref Range Status   Specimen Description  Final    URINE, CLEAN CATCH Performed at [facility], [address], [place], [place] [address]    Special Requests   Final    NONE Performed at [facility], [address] [place], [place] [address]    Culture >=100,000 COLONIES/mL ESCHERICHIA COLI (A)  Final   Report Status 04/16/2024 FINAL  Final   Organism ID, Bacteria ESCHERICHIA COLI (A)  Final      Susceptibility   Escherichia coli - MIC*    AMPICILLIN RESISTANT Resistant     CEFAZOLIN (URINE) Value in next row Sensitive      2 SENSITIVEThis is a modified FDA-approved test that has been validated and its performance characteristics determined by the reporting laboratory.  This laboratory is certified under the Clinical  Laboratory Improvement Amendments CLIA as qualified to perform high complexity clinical laboratory testing.    CEFEPIME Value in next row Sensitive      2 SENSITIVEThis is a modified FDA-approved test that has been validated and its performance characteristics determined by the reporting laboratory.  This laboratory is certified under the Clinical Laboratory Improvement Amendments CLIA as qualified to perform high complexity clinical laboratory testing.    ERTAPENEM Value in next row Sensitive      2 SENSITIVEThis is a modified FDA-approved test that has been validated and its performance characteristics determined by the reporting laboratory.  This laboratory is certified under the Clinical Laboratory Improvement Amendments CLIA as qualified to perform high complexity clinical laboratory testing.    CEFTRIAXONE  Value in next row Sensitive      2 SENSITIVEThis is a modified FDA-approved test that has been validated and its performance characteristics determined by the reporting laboratory.  This laboratory is certified under the Clinical Laboratory Improvement Amendments CLIA as qualified to perform high complexity clinical laboratory testing.    CIPROFLOXACIN Value in next row Sensitive      2 SENSITIVEThis is a modified FDA-approved test that has been validated and its performance characteristics determined by the reporting laboratory.  This laboratory is certified under the Clinical Laboratory Improvement Amendments CLIA as qualified to perform high complexity clinical laboratory testing.    GENTAMICIN Value in next row Sensitive      2 SENSITIVEThis is a modified FDA-approved test that has been validated and its performance characteristics determined by the reporting laboratory.  This laboratory is certified under the Clinical Laboratory Improvement Amendments CLIA as qualified to perform high complexity clinical laboratory testing.    NITROFURANTOIN Value in next row Sensitive      2 SENSITIVEThis  is a modified FDA-approved test that has been validated and its performance characteristics determined by the reporting laboratory.  This laboratory is certified under the Clinical Laboratory Improvement Amendments CLIA as qualified to perform high complexity clinical laboratory testing.    TRIMETH/SULFA Value in next row Sensitive      2 SENSITIVEThis is a modified FDA-approved test that has been validated and its performance characteristics determined by the reporting laboratory.  This laboratory is certified under the Clinical Laboratory Improvement Amendments CLIA as qualified to perform high complexity clinical laboratory testing.    AMPICILLIN/SULBACTAM Value in next row Sensitive      2 SENSITIVEThis is a modified FDA-approved test that has been validated and its performance characteristics determined by the reporting laboratory.  This laboratory is certified under the Clinical Laboratory Improvement Amendments CLIA as qualified to perform high complexity clinical laboratory testing.    PIP/TAZO Value in next row Intermediate  32 INTERMEDIATEThis is a modified FDA-approved test that has been validated and its performance characteristics determined by the reporting laboratory.  This laboratory is certified under the Clinical Laboratory Improvement Amendments CLIA as qualified to perform high complexity clinical laboratory testing.    MEROPENEM Value in next row Sensitive      32 INTERMEDIATEThis is a modified FDA-approved test that has been validated and its performance characteristics determined by the reporting laboratory.  This laboratory is certified under the Clinical Laboratory Improvement Amendments CLIA as qualified to perform high complexity clinical laboratory testing.    * >=100,000 COLONIES/mL ESCHERICHIA COLI  Resp panel by RT-PCR (RSV, Flu A&B, Covid) Anterior Nasal Swab     Status: None   Collection Time: 04/14/24 10:36 AM   Specimen: Anterior Nasal Swab  Result Value Ref Range  Status   SARS Coronavirus 2 by RT PCR NEGATIVE NEGATIVE Final    Comment: (NOTE) SARS-CoV-2 target nucleic acids are NOT DETECTED.  The SARS-CoV-2 RNA is generally detectable in upper respiratory specimens during the acute phase of infection. The lowest concentration of SARS-CoV-2 viral copies this assay can detect is 138 copies/mL. A negative result does not preclude SARS-Cov-2 infection and should not be used as the sole basis for treatment or other patient management decisions. A negative result may occur with  improper specimen collection/handling, submission of specimen other than nasopharyngeal swab, presence of viral mutation(s) within the areas targeted by this assay, and inadequate number of viral copies(<138 copies/mL). A negative result must be combined with clinical observations, patient history, and epidemiological information. The expected result is Negative.  Fact Sheet for Patients:  bloggercourse.com  Fact Sheet for Healthcare Providers:  seriousbroker.it  This test is no t yet approved or cleared by the [place]  FDA and  has been authorized for detection and/or diagnosis of SARS-CoV-2 by FDA under an Emergency Use Authorization (EUA). This EUA will remain  in effect (meaning this test can be used) for the duration of the COVID-19 declaration under Section 564(b)(1) of the Act, 21 U.S.C.section 360bbb-3(b)(1), unless the authorization is terminated  or revoked sooner.       Influenza A by PCR NEGATIVE NEGATIVE Final   Influenza B by PCR NEGATIVE NEGATIVE Final    Comment: (NOTE) The Xpert Xpress SARS-CoV-2/FLU/RSV plus assay is intended as an aid in the diagnosis of influenza from Nasopharyngeal swab specimens and should not be used as a sole basis for treatment. Nasal washings and aspirates are unacceptable for Xpert Xpress SARS-CoV-2/FLU/RSV testing.  Fact Sheet for  Patients: bloggercourse.com  Fact Sheet for Healthcare Providers: seriousbroker.it  This test is not yet approved or cleared by the [place]  FDA and has been authorized for detection and/or diagnosis of SARS-CoV-2 by FDA under an Emergency Use Authorization (EUA). This EUA will remain in effect (meaning this test can be used) for the duration of the COVID-19 declaration under Section 564(b)(1) of the Act, 21 U.S.C. section 360bbb-3(b)(1), unless the authorization is terminated or revoked.     Resp Syncytial Virus by PCR NEGATIVE NEGATIVE Final    Comment: (NOTE) Fact Sheet for Patients: bloggercourse.com  Fact Sheet for Healthcare Providers: seriousbroker.it  This test is not yet approved or cleared by the [place]  FDA and has been authorized for detection and/or diagnosis of SARS-CoV-2 by FDA under an Emergency Use Authorization (EUA). This EUA will remain in effect (meaning this test can be used) for the duration of the COVID-19 declaration under Section 564(b)(1) of the Act, 21 U.S.C.  section 360bbb-3(b)(1), unless the authorization is terminated or revoked.  Performed at [facility], [address], [place], [place] [address]     Labs: CBC: Recent Labs  Lab 04/14/24 0622 04/15/24 0626  WBC 6.9 4.5  NEUTROABS 3.9  --   HGB 12.1 11.7*  HCT 39.0 38.8  MCV 90.5 89.6  PLT 121* 125*   Basic Metabolic Panel: Recent Labs  Lab 04/14/24 0622 04/15/24 0626  NA 139 136  K 4.1 3.5  CL 102 102  CO2 26 26  GLUCOSE 91 79  BUN 12 12  CREATININE 0.83 0.76  CALCIUM  9.1 8.9   Liver Function Tests: Recent Labs  Lab 04/14/24 0622 04/15/24 0626  AST 41 44*  ALT 19 24  ALKPHOS 51 52  BILITOT 0.8 0.8  PROT 7.7 7.3  ALBUMIN 3.6 3.3*   CBG: No results for input(s): GLUCAP in the last 168 hours.  Discharge time spent: greater than 30  minutes.  Signed: Owen DELENA Lore, MD [facility] 04/16/2024

## 2024-04-16 NOTE — TOC Transition Note (Signed)
 Transition of Care [facility]) - Discharge Note   Patient Details  Name: Ashley Willis MRN: [id number] Date of Birth: 13-Mar-1927  Transition of [facility]) CM/SW Contact:  Sonda Manuella Quill, RN Phone Number: 04/16/2024, 10:23 AM   Clinical Narrative:    Orders received for HHPT, 3-N-1, and transport chair; spoke w/ pt's dtr Ashley Willis at bedside; she declined having HHPT set up by this RN CM; she said she will have this set up by pt's PCP; [name] also declined having DME delivered to room; she said she will purchase DME independently; no IP CM needs.   Final next level of care: Home/Self Care Barriers to Discharge: No Barriers Identified   Patient Goals and CMS Choice Patient states their goals for this hospitalization and ongoing recovery are:: pt will return home per her dtr Ashley Willis          Discharge Placement                       Discharge Plan and Services Additional resources added to the After Visit Summary for     Discharge Planning Services: CM Consult            DME Arranged: N/A DME Agency: NA       HH Arranged: NA HH Agency: NA        Social Drivers of Health (SDOH) Interventions SDOH Screenings   Food Insecurity: No Food Insecurity (04/14/2024)  Housing: Low Risk  (04/14/2024)  Transportation Needs: No Transportation Needs (04/14/2024)  Utilities: Not At Risk (04/14/2024)  Social Connections: Moderately Isolated (04/14/2024)  Tobacco Use: Low Risk  (04/14/2024)     Readmission Risk Interventions     No data to display

## 2024-04-16 NOTE — Evaluation (Signed)
 Physical Therapy Evaluation Patient Details Name: Ashley Willis MRN: [id number] DOB: 1926-12-19 Today's Date: 04/16/2024  History of Present Illness  Pt is a 89 y/o female presenting on 10/31 for dehydration, sepsis, UTI and acute metabolic encephalopathy. Pt with hx including but not limited to anemia, CAD, sholelithiasis, diverticulosis, GERD, hiatal hernia, HLD, HTN, memory loss, pancreatitis, tubular adenoma, RA  Clinical Impression  Pt admitted with above diagnosis. At baseline, pt resides at home with family/friends with 24 hr care.  She has assist with walking (does not use AD) and has assist with ADLs.  Today, pt needing increased cues, mod A for bed mobility, and HHA of 2 to ambulate 35' for lines/leads and safety.  Family hopeful to get pt home in familiar environment for improved mental status.  PT agrees and recommends HHPT and below DME.  Pt currently with functional limitations due to the deficits listed below (see PT Problem List). Pt will benefit from acute skilled PT to increase their independence and safety with mobility to allow discharge.           If plan is discharge home, recommend the following: A little help with walking and/or transfers;A little help with bathing/dressing/bathroom;Assistance with cooking/housework;Help with stairs or ramp for entrance   Can travel by private vehicle        Equipment Recommendations Other (comment);BSC/3in1 (transport chair)  Recommendations for Other Services       Functional Status Assessment Patient has had a recent decline in their functional status and demonstrates the ability to make significant improvements in function in a reasonable and predictable amount of time.     Precautions / Restrictions Precautions Precautions: Fall      Mobility  Bed Mobility Overal bed mobility: Needs Assistance Bed Mobility: Supine to Sit     Supine to sit: Mod assist     General bed mobility comments: increased cues then assist  for trunk and scooting    Transfers Overall transfer level: Needs assistance Equipment used: 2 person hand held assist               General transfer comment: min A of 2 to stand; did work on leaning forward with UE support on arm chair in front prior to attempting standing    Ambulation/Gait Ambulation/Gait assistance: Min assist, +2 safety/equipment Gait Distance (Feet): 35 Feet Assistive device: 2 person hand held assist Gait Pattern/deviations: Step-to pattern, Decreased stride length, Shuffle Gait velocity: decreased     General Gait Details: HHA to guide with min A for balance.  Pt able to ambulate out in hall and back.  Provided daughter wtih gait belt for home and educated on use  Stairs            Wheelchair Mobility     Tilt Bed    Modified Rankin (Stroke Patients Only)       Balance Overall balance assessment: Needs assistance Sitting-balance support: No upper extremity supported Sitting balance-Leahy Scale: Fair Sitting balance - Comments: initial posterior lean  but with leaning forward onto armrest improved and able to maintain wtih superivison     Standing balance-Leahy Scale: Poor Standing balance comment: HHA of 2                             Pertinent Vitals/Pain Pain Assessment Pain Assessment: No/denies pain    Home Living Family/patient expects to be discharged to:: Private residence Living Arrangements: Children Available Help at Discharge: Family;Friend(s);Available 24  hours/day Type of Home: House Home Access: Level entry       Home Layout: One level Home Equipment: Hospital bed;Rolling Walker (2 wheels)      Prior Function Prior Level of Function : Needs assist             Mobility Comments: HHA from family  to walk around the house adn to car ADLs Comments: Family and friends assist with all adls, feeds herself most of the time     Extremity/Trunk Assessment   Upper Extremity Assessment Upper  Extremity Assessment: RUE deficits/detail RUE Deficits / Details: Injured shoulder in past , limited ROM, defer to OT for further assessment    Lower Extremity Assessment Lower Extremity Assessment: Difficult to assess due to impaired cognition (Initially stiff but with stretching ROM WFL, at least 3/5 strength but not able to further assess; appears equal bil)    Cervical / Trunk Assessment Cervical / Trunk Assessment: Normal  Communication        Cognition Arousal: Alert Behavior During Therapy: Flat affect   PT - Cognitive impairments: History of cognitive impairments                       PT - Cognition Comments: increased cues for transfers; daughter reports pt can resist at times but was cooperative today; hoping to get her home to be in familiar environment         Cueing Cueing Techniques: Verbal cues, Gestural cues, Tactile cues, Visual cues     General Comments General comments (skin integrity, edema, etc.): VSS.  Educated daughter on recommendation for HHPT, BSC, and transport chair    Exercises     Assessment/Plan    PT Assessment Patient needs continued PT services  PT Problem List Decreased strength;Decreased mobility;Decreased safety awareness;Decreased range of motion;Decreased activity tolerance;Decreased balance;Decreased knowledge of use of DME;Decreased cognition       PT Treatment Interventions DME instruction;Therapeutic exercise;Stair training;Functional mobility training;Therapeutic activities;Patient/family education;Balance training;Neuromuscular re-education    PT Goals (Current goals can be found in the Care Plan section)  Acute Rehab PT Goals Patient Stated Goal: return home per family PT Goal Formulation: With patient/family Time For Goal Achievement: 04/30/24 Potential to Achieve Goals: Good    Frequency Min 2X/week     Co-evaluation               AM-PAC PT 6 Clicks Mobility  Outcome Measure Help needed turning  from your back to your side while in a flat bed without using bedrails?: A Lot Help needed moving from lying on your back to sitting on the side of a flat bed without using bedrails?: A Lot Help needed moving to and from a bed to a chair (including a wheelchair)?: A Little Help needed standing up from a chair using your arms (e.g., wheelchair or bedside chair)?: A Little Help needed to walk in hospital room?: A Lot Help needed climbing 3-5 steps with a railing? : A Lot 6 Click Score: 14    End of Session Equipment Utilized During Treatment: Gait belt Activity Tolerance: Patient tolerated treatment well Patient left: with chair alarm set;in chair;with call bell/phone within reach Nurse Communication: Mobility status PT Visit Diagnosis: Other abnormalities of gait and mobility (R26.89);Muscle weakness (generalized) (M62.81)    Time: 9077-9051 PT Time Calculation (min) (ACUTE ONLY): 26 min   Charges:   PT Evaluation $PT Eval Low Complexity: 1 Low PT Treatments $Gait Training: 8-22 mins PT General Charges $$ ACUTE  PT VISIT: 1 Visit         Benjiman, PT Acute Rehab Services [facility] [phone]   Benjiman VEAR [place] 04/16/2024, 10:42 AM

## 2024-04-16 NOTE — Plan of Care (Signed)

## 2024-04-16 NOTE — Evaluation (Signed)
 Occupational Therapy Evaluation Patient Details Name: Ashley Willis MRN: [id number] DOB: 1927/04/27 Today's Date: 04/16/2024   History of Present Illness   Pt is a 89 y/o female presenting on 10/31 for dehydration, sepsis, UTI and acute metabolic encephalopathy. Pt with hx including but not limited to anemia, CAD, sholelithiasis, diverticulosis, GERD, hiatal hernia, HLD, HTN, memory loss, pancreatitis, tubular adenoma, RA     Clinical Impressions PTA patient needing assist for ADLs but able to self feed, family/friends assisting with ADLs and mobility given hand held assist.  Admitted for above and presents with problem list below.  She is fatigued and agreeable to chair position in bed but not OOB,  simulated self feeding with setup assist otherwise baseline for ADLs. Educated daughter on BSC/3:1 DME for safety at home, use next to bed or over commode.   Pt has 24/7 support at home, and family has been assisting with mobility to bathroom at baseline.  Based on performance today, no further services needed after dc home but will follow acutely.       If plan is discharge home, recommend the following:   A little help with walking and/or transfers;A lot of help with bathing/dressing/bathroom;Assistance with cooking/housework;Direct supervision/assist for medications management;Assist for transportation;Direct supervision/assist for financial management;Help with stairs or ramp for entrance;Supervision due to cognitive status     Functional Status Assessment   Patient has had a recent decline in their functional status and demonstrates the ability to make significant improvements in function in a reasonable and predictable amount of time.     Equipment Recommendations   BSC/3in1     Recommendations for Other Services         Precautions/Restrictions   Precautions Precautions: Fall Restrictions Weight Bearing Restrictions Per Provider Order: No     Mobility Bed  Mobility Overal bed mobility: Needs Assistance             General bed mobility comments: repositoined in bed with total assist    Transfers                   General transfer comment: deferred due to fatigue.      Balance                                           ADL either performed or assessed with clinical judgement   ADL Overall ADL's : At baseline                                       General ADL Comments: pt at baseline for self care, family assist with bathing/dressing and pt self feeds at times (good days)     Vision   Additional Comments: NT formally tested     Perception         Praxis         Pertinent Vitals/Pain Pain Assessment Pain Assessment: No/denies pain     Extremity/Trunk Assessment Upper Extremity Assessment Upper Extremity Assessment: RUE deficits/detail;Right hand dominant RUE Deficits / Details: Injured shoulder in past , limited ROM at shoulder but able to bring hand to mouth RUE Coordination: decreased gross motor   Lower Extremity Assessment Lower Extremity Assessment: Defer to PT evaluation   Cervical / Trunk Assessment Cervical / Trunk Assessment: Normal   Communication Communication Communication:  No apparent difficulties   Cognition Arousal: Alert Behavior During Therapy: Flat affect Cognition: History of cognitive impairments             OT - Cognition Comments: hx of dementia, pt oriented to self and follow simple 1 step commands.                 Following commands: Intact (1 step commands)       Cueing  General Comments   Cueing Techniques: Verbal cues  daughter reports having appropriate assist at home, managing patients ADLs at baseline.   Exercises     Shoulder Instructions      Home Living Family/patient expects to be discharged to:: Private residence Living Arrangements: Children Available Help at Discharge: Family;Friend(s);Available 24  hours/day Type of Home: House Home Access: Level entry     Home Layout: One level     Bathroom Shower/Tub: Tub/shower unit;Sponge bathes at baseline   Firefighter: Standard     Home Equipment: Hospital bed;Rolling Walker (2 wheels)          Prior Functioning/Environment Prior Level of Function : Needs assist             Mobility Comments: HHA from family  to walk around the house and to car ADLs Comments: family/friends assist for most ADLs, pt able to self feed most of the time and pt aware of when she needs to use the bathroom- family able to assist with mobility to commode    OT Problem List: Impaired balance (sitting and/or standing);Decreased activity tolerance;Decreased knowledge of use of DME or AE;Decreased knowledge of precautions   OT Treatment/Interventions: Self-care/ADL training;DME and/or AE instruction;Therapeutic activities;Patient/family education;Balance training      OT Goals(Current goals can be found in the care plan section)   Acute Rehab OT Goals Patient Stated Goal: home per daughter OT Goal Formulation: With family Time For Goal Achievement: 04/30/24 Potential to Achieve Goals: Fair   OT Frequency:  Min 2X/week    Co-evaluation              AM-PAC OT 6 Clicks Daily Activity     Outcome Measure Help from another person eating meals?: A Little Help from another person taking care of personal grooming?: A Lot Help from another person toileting, which includes using toliet, bedpan, or urinal?: Total Help from another person bathing (including washing, rinsing, drying)?: Total Help from another person to put on and taking off regular upper body clothing?: Total Help from another person to put on and taking off regular lower body clothing?: Total 6 Click Score: 9   End of Session Nurse Communication: Mobility status  Activity Tolerance: Patient tolerated treatment well Patient left: in bed;with call bell/phone within  reach;with bed alarm set;with family/visitor present  OT Visit Diagnosis: Other abnormalities of gait and mobility (R26.89);Muscle weakness (generalized) (M62.81)                Time: 8968-8944 OT Time Calculation (min): 24 min Charges:  OT General Charges $OT Visit: 1 Visit OT Evaluation $OT Eval Low Complexity: 1 Low OT Treatments $Self Care/Home Management : 8-22 mins  Etta NOVAK, OT Acute Rehabilitation Services Office [phone] Secure Chat Preferred    Etta GORMAN Hope 04/16/2024, 11:06 AM

## 2024-04-19 LAB — CULTURE, BLOOD (ROUTINE X 2)
Culture: NO GROWTH
Culture: NO GROWTH
Special Requests: ADEQUATE
Special Requests: ADEQUATE

## 2024-04-24 DIAGNOSIS — N390 Urinary tract infection, site not specified: Secondary | ICD-10-CM | POA: Diagnosis not present

## 2024-04-25 DIAGNOSIS — N390 Urinary tract infection, site not specified: Secondary | ICD-10-CM | POA: Diagnosis not present

## 2024-05-29 ENCOUNTER — Encounter: Payer: Self-pay | Admitting: Podiatry

## 2024-05-29 ENCOUNTER — Ambulatory Visit: Admitting: Podiatry

## 2024-05-29 DIAGNOSIS — F0390 Unspecified dementia without behavioral disturbance: Secondary | ICD-10-CM

## 2024-05-29 DIAGNOSIS — M79675 Pain in left toe(s): Secondary | ICD-10-CM

## 2024-05-29 DIAGNOSIS — M79674 Pain in right toe(s): Secondary | ICD-10-CM | POA: Diagnosis not present

## 2024-05-29 DIAGNOSIS — B351 Tinea unguium: Secondary | ICD-10-CM | POA: Diagnosis not present

## 2024-05-29 NOTE — Progress Notes (Signed)
 This patient returns to the office for evaluation and treatment of long thick painful nails .  This patient is unable to trim his own nails since the patient cannot reach the feet.  Patient says the nails are painful walking and wearing her shoes.  She returns for preventive foot care services.   She presents with her son.  General Appearance  Alert, conversant and in no acute stress.  Vascular  Dorsalis pedis and posterior tibial  pulses are weakly  palpable  bilaterally.  Capillary return is within normal limits  bilaterally. Temperature is within normal limits  bilaterally.  Neurologic  Senn-Weinstein monofilament wire test diminished   bilaterally. Muscle power within normal limits bilaterally.  Nails Thick disfigured discolored nails with subungual debris  from hallux to fifth toes bilaterally.  Orthopedic  No limitations of motion  feet .  No crepitus or effusions noted.  No bony pathology or digital deformities noted.  HAV  B/L. Overlapping second toe left foot.  Overlapping hallux right foot.  Skin  normotropic skin with no porokeratosis noted bilaterally.  No signs of infections or ulcers noted.     Onychomycosis  Pain in toes right foot  Pain in toes left foot    Debridement  of nails  1-5  B/L with a nail nipper.  Nails were then filed using a dremel tool with no incidents.   RTC  3 months      Cordella Bold DPM

## 2024-06-13 ENCOUNTER — Emergency Department (HOSPITAL_COMMUNITY)

## 2024-06-13 ENCOUNTER — Observation Stay (HOSPITAL_COMMUNITY): Admission: EM | Admit: 2024-06-13 | Discharge: 2024-06-15 | Disposition: A | Discharge status: Home / Self Care

## 2024-06-13 ENCOUNTER — Other Ambulatory Visit: Payer: Self-pay

## 2024-06-13 ENCOUNTER — Encounter (HOSPITAL_COMMUNITY): Payer: Self-pay

## 2024-06-13 DIAGNOSIS — M810 Age-related osteoporosis without current pathological fracture: Secondary | ICD-10-CM | POA: Diagnosis not present

## 2024-06-13 DIAGNOSIS — I10 Essential (primary) hypertension: Secondary | ICD-10-CM | POA: Diagnosis not present

## 2024-06-13 DIAGNOSIS — R404 Transient alteration of awareness: Secondary | ICD-10-CM | POA: Diagnosis present

## 2024-06-13 DIAGNOSIS — R569 Unspecified convulsions: Secondary | ICD-10-CM

## 2024-06-13 DIAGNOSIS — R55 Syncope and collapse: Secondary | ICD-10-CM | POA: Diagnosis not present

## 2024-06-13 DIAGNOSIS — R410 Disorientation, unspecified: Secondary | ICD-10-CM | POA: Insufficient documentation

## 2024-06-13 DIAGNOSIS — R413 Other amnesia: Secondary | ICD-10-CM | POA: Diagnosis present

## 2024-06-13 DIAGNOSIS — R4020 Unspecified coma: Principal | ICD-10-CM

## 2024-06-13 DIAGNOSIS — Z8659 Personal history of other mental and behavioral disorders: Secondary | ICD-10-CM | POA: Insufficient documentation

## 2024-06-13 DIAGNOSIS — I2510 Atherosclerotic heart disease of native coronary artery without angina pectoris: Secondary | ICD-10-CM | POA: Diagnosis not present

## 2024-06-13 LAB — DIFFERENTIAL
Abs Immature Granulocytes: 0.03 K/uL (ref 0.00–0.07)
Basophils Absolute: 0.1 K/uL (ref 0.0–0.1)
Basophils Relative: 1 %
Eosinophils Absolute: 0.3 K/uL (ref 0.0–0.5)
Eosinophils Relative: 6 %
Immature Granulocytes: 1 %
Lymphocytes Relative: 41 %
Lymphs Abs: 2.1 K/uL (ref 0.7–4.0)
Monocytes Absolute: 0.6 K/uL (ref 0.1–1.0)
Monocytes Relative: 11 %
Neutro Abs: 2 K/uL (ref 1.7–7.7)
Neutrophils Relative %: 40 %

## 2024-06-13 LAB — URINALYSIS, ROUTINE W REFLEX MICROSCOPIC
Bilirubin Urine: NEGATIVE
Glucose, UA: NEGATIVE mg/dL
Ketones, ur: NEGATIVE mg/dL
Leukocytes,Ua: NEGATIVE
Nitrite: NEGATIVE
Protein, ur: NEGATIVE mg/dL
RBC / HPF: >50 RBC/hpf (ref 0–5)
Specific Gravity, Urine: 1.035 — ABNORMAL HIGH (ref 1.005–1.030)
pH: 7 (ref 5.0–8.0)

## 2024-06-13 LAB — CBC
HCT: 41.7 % (ref 36.0–46.0)
Hemoglobin: 13.1 g/dL (ref 12.0–15.0)
MCH: 27.5 pg (ref 26.0–34.0)
MCHC: 31.4 g/dL (ref 30.0–36.0)
MCV: 87.6 fL (ref 80.0–100.0)
Platelets: 187 K/uL (ref 150–400)
RBC: 4.76 MIL/uL (ref 3.87–5.11)
RDW: 12.9 % (ref 11.5–15.5)
WBC: 5.1 K/uL (ref 4.0–10.5)
nRBC: 0 % (ref 0.0–0.2)

## 2024-06-13 LAB — I-STAT CHEM 8, ED
BUN: 13 mg/dL (ref 8–23)
Calcium, Ion: 1.21 mmol/L (ref 1.15–1.40)
Chloride: 100 mmol/L (ref 98–111)
Creatinine, Ser: 0.7 mg/dL (ref 0.44–1.00)
Glucose, Bld: 72 mg/dL (ref 70–99)
HCT: 45 % (ref 36.0–46.0)
Hemoglobin: 15.3 g/dL — ABNORMAL HIGH (ref 12.0–15.0)
Potassium: 3.9 mmol/L (ref 3.5–5.1)
Sodium: 142 mmol/L (ref 135–145)
TCO2: 27 mmol/L (ref 22–32)

## 2024-06-13 LAB — COMPREHENSIVE METABOLIC PANEL WITH GFR
ALT: 13 U/L (ref 0–44)
AST: 29 U/L (ref 15–41)
Albumin: 3.7 g/dL (ref 3.5–5.0)
Alkaline Phosphatase: 61 U/L (ref 38–126)
Anion gap: 10 (ref 5–15)
BUN: 12 mg/dL (ref 8–23)
CO2: 26 mmol/L (ref 22–32)
Calcium: 9.2 mg/dL (ref 8.9–10.3)
Chloride: 101 mmol/L (ref 98–111)
Creatinine, Ser: 0.71 mg/dL (ref 0.44–1.00)
GFR, Estimated: >60 mL/min
Glucose, Bld: 77 mg/dL (ref 70–99)
Potassium: 3.8 mmol/L (ref 3.5–5.1)
Sodium: 136 mmol/L (ref 135–145)
Total Bilirubin: 0.4 mg/dL (ref 0.0–1.2)
Total Protein: 8.3 g/dL — ABNORMAL HIGH (ref 6.5–8.1)

## 2024-06-13 LAB — TROPONIN T, HIGH SENSITIVITY: Troponin T High Sensitivity: 43 ng/L — ABNORMAL HIGH (ref 0–19)

## 2024-06-13 LAB — PROTIME-INR
INR: 1.1 (ref 0.8–1.2)
Prothrombin Time: 14.9 s (ref 11.4–15.2)

## 2024-06-13 LAB — ETHANOL: Alcohol, Ethyl (B): <15 mg/dL

## 2024-06-13 LAB — APTT: aPTT: 29 s (ref 24–36)

## 2024-06-13 MED ORDER — SODIUM CHLORIDE 0.9% FLUSH
3.0000 mL | Freq: Once | INTRAVENOUS | Status: AC
  Administered 2024-06-13: 3 mL via INTRAVENOUS

## 2024-06-13 MED ORDER — IOHEXOL 350 MG/ML SOLN
75.0000 mL | Freq: Once | INTRAVENOUS | Status: AC | PRN
  Administered 2024-06-13: 75 mL via INTRAVENOUS

## 2024-06-13 NOTE — ED Notes (Signed)
Hospitalist to bedside.

## 2024-06-13 NOTE — ED Triage Notes (Signed)
 According to [facility]: Ems was called to unresponsive pt who was responsive on arrival. Stated that because she was not talking she was unresponsive. Ems noted right corner eye was dropping to mouth, however Thursday left side of face and side of body was weak, pt was aphasic til Saturday. At Saturday pt was at her baseline.  Vitals 168/70 Cbg 146 HR 70 Spo2 100%

## 2024-06-13 NOTE — ED Provider Notes (Signed)
 " [place] EMERGENCY DEPARTMENT AT [facility] Provider Note   CSN: [id number] Arrival date & time: 06/13/24  1523  An emergency department physician performed an initial assessment on this suspected stroke patient at 1516.  Patient presents with: Code Stroke   Ashley Willis is a 89 y.o. female.   Patient here as a code stroke.  History somewhat limited therefore level 5 caveat is patient little bit slow to respond.  History provided by EMS and family.  Patient had an unresponsive event at home lasted about 5 minutes.  Did not seem like there is seizure activity.  She was not really speaking afterwards when she started to wake up and seemingly track with her eyes.  She started to talk a little bit with EMS.  They thought maybe there was some facial droop code stroke was enacted in the field.  She seems to be moving all extremities and talking now but family thinks that she is likely back to her baseline.  They also mention that she had left-sided weakness last week Thursday through Saturday that they did not seek medical attention for that resolved on their own.  They do not think she has seizure history.  But she has frequent episodes where she is shortly nonresponsive but this is the longest that they have ever seen her nonresponsive before.  They deny any fevers or chills.  No nausea or vomiting.  She has a history of hypertension high cholesterol CAD  The history is provided by the patient, the EMS personnel and a caregiver.       Prior to Admission medications  Medication Sig Start Date End Date Taking? Authorizing Provider  cetirizine (ZYRTEC) 10 MG chewable tablet Chew 10 mg by mouth daily.    [provider]  cholecalciferol (VITAMIN D) 1000 UNITS tablet Take 2,000 Units by mouth daily.    [provider]  fluticasone  (FLONASE ) 50 MCG/ACT nasal spray Place 1 spray into both nostrils daily. Patient not taking: Reported on 04/14/2024 10/31/21   Gladis Leonor HERO, MD  ipratropium-albuterol  (DUONEB) 0.5-2.5 (3) MG/3ML SOLN Inhale 3 mLs into the lungs every 6 (six) hours as needed (For shortness of breath). Patient not taking: Reported on 04/14/2024 07/28/22   [provider]    Allergies: Codeine and Donepezil    Review of Systems  Updated Vital Signs BP 138/64   Pulse 74   Resp 19   Ht 5' (1.524 m)   Wt 49.9 kg   SpO2 100%   BMI 21.48 kg/m   Physical Exam Vitals and nursing note reviewed.  Constitutional:      General: She is not in acute distress.    Appearance: She is well-developed.     Comments: Somewhat frail-appearing chronically  HENT:     Head: Normocephalic and atraumatic.     Nose: Nose normal.     Mouth/Throat:     Mouth: Mucous membranes are moist.  Eyes:     Extraocular Movements: Extraocular movements intact.     Conjunctiva/sclera: Conjunctivae normal.     Pupils: Pupils are equal, round, and reactive to light.  Cardiovascular:     Rate and Rhythm: Normal rate and regular rhythm.     Pulses: Normal pulses.     Heart sounds: No murmur heard. Pulmonary:     Effort: Pulmonary effort is normal. No respiratory distress.     Breath sounds: Normal breath sounds.  Abdominal:     Palpations: Abdomen is soft.  Tenderness: There is no abdominal tenderness.  Musculoskeletal:        General: No swelling. Normal range of motion.     Cervical back: Neck supple.  Skin:    General: Skin is warm and dry.     Capillary Refill: Capillary refill takes less than 2 seconds.  Neurological:     Mental Status: She is alert.     Comments: She can tell me her name she follows commands she moves all extremities but hard to get a very detailed neurological exam on her  Psychiatric:        Mood and Affect: Mood normal.     (all labs ordered are listed, but only abnormal results are displayed) Labs Reviewed  I-STAT CHEM 8, ED - Abnormal; Notable for the following components:      Result Value   Hemoglobin  15.3 (*)    All other components within normal limits  PROTIME-INR  APTT  CBC  DIFFERENTIAL  COMPREHENSIVE METABOLIC PANEL WITH GFR  ETHANOL  URINALYSIS, ROUTINE W REFLEX MICROSCOPIC  CBG MONITORING, ED  TROPONIN T, HIGH SENSITIVITY    EKG: None  Radiology:    Procedures   Medications Ordered in the ED  sodium chloride  flush (NS) 0.9 % injection 3 mL (has no administration in time range)  iohexol  (OMNIPAQUE ) 350 MG/ML injection 75 mL (75 mLs Intravenous Contrast Given 06/13/24 1536)                                    Medical Decision Making Amount and/or Complexity of Data Reviewed Labs: ordered. Radiology: ordered.   Ashley Willis is here after unresponsive event/syncopal type event.  She was unresponsive for about 5 minutes then was slow to start to talk afterwards.  Stroke was enacted in the field with EMS.  Dr. Michaela with neurology at the bedside as well as I.  Patient responsive answering some simple questions moving all extremities.  No evidence of seizures.  CT scan was unremarkable.  No LVO concern on exam.  She seems to be back at her baseline.  She looks frail chronically.  She is a DNR.  Differential diagnosis could have been a stroke especially with the symptoms that she had a week ago where she had left-sided weakness for a couple days that got better on their own.  Seems less likely that it could be seizures or somewhat other cardiac arrhythmia or infectious process.  Family does want to pursue workup she is 89.  She is DNR.  Ultimately the plan will be to do infectious metabolic workup stroke workup.  Will get MRI basic labs urinalysis chest x-ray troponin.  Will get EKG.  She is hemodynamically stable here.  Seems like she is back to her baseline.  Will get workup and will decide next steps if they want to be admitted or not.  Will handoff the patient to Dr. Elnor for further care.  Please see her note for further results evaluation and disposition of the  patient.  This chart was dictated using voice recognition software.  Despite best efforts to proofread,  errors can occur which can change the documentation meaning.      Final diagnoses:  Loss of consciousness [facility])    ED Discharge Orders     None          Ruthe Cornet, DO 06/13/24 1630  "

## 2024-06-13 NOTE — Consult Note (Addendum)
 NEUROLOGY CONSULT NOTE   Date of service: June 13, 2024 Patient Name: Ashley Willis MRN:  [id number] DOB:  Apr 03, 1927 Chief Complaint: Episode of unresponsiveness Requesting Provider: Ruthe Cornet, DO  History of Present Illness  Ashley Willis is a 89 y.o. female with hx of hypertension, hyperlipidemia, GERD, thrombocytopenia and restrictive lung disease who presents with an episode of unresponsiveness which occurred earlier today.  Patient's family and caregiver report that she was in her normal state of health when she woke up and was speaking and responding normally.  Later on in the day, her caregiver noticed that her eyes closed suddenly, her mouth with slack, causing her dentures to fall out and she became unresponsive for about 5 minutes.  EMS was called, and patient began to become responsive while en route to the hospital.  Caregiver and family note that she had some left-sided weakness, left-sided facial droop and inability to speak which started on Thursday was accompanied by left shoulder and leg pain and resolved on Saturday.  Patient was still able to walk during this episode, and family did not bring her to the hospital.  Family reports that she may have had a seizure or syncopal episode last year in which she briefly became unresponsive, but workup was not performed due to her advanced age.  LKW: 1200 Modified rankin score: 3-Moderate disability-requires help but walks WITHOUT assistance IV Thrombolysis: No, likely stroke in the last 3 months EVT: No, no LVO  NIHSS components Score: Comment  1a Level of Conscious 0[x]  1[]  2[]  3[]      1b LOC Questions 0[]  1[]  2[x]       1c LOC Commands 0[x]  1[]  2[]       2 Best Gaze 0[x]  1[]  2[]       3 Visual 0[x]  1[]  2[]  3[]      4 Facial Palsy 0[x]  1[]  2[]  3[]      5a Motor Arm - left 0[x]  1[]  2[]  3[]  4[]  UN[]    5b Motor Arm - Right 0[]  1[]  2[x]  3[]  4[]  UN[]    6a Motor Leg - Left 0[]  1[x]  2[]  3[]  4[]  UN[]    6b Motor Leg - Right 0[x]   1[]  2[]  3[]  4[]  UN[]    7 Limb Ataxia 0[x]  1[]  2[]  UN[]      8 Sensory 0[x]  1[]  2[]  UN[]      9 Best Language 0[x]  1[]  2[]  3[]      10 Dysarthria 0[x]  1[]  2[]  UN[]      11 Extinct. and Inattention 0[x]  1[]  2[]       TOTAL:5       ROS   Unable to ascertain due to altered mental status  Past History   Past Medical History:  Diagnosis Date   Anemia    CAD (coronary artery disease) 07/14/2006   ECHO - EF >55%; borderline concentric LVH; LA mildly dilated, mild tricuspid regurgitation   CAD (coronary artery disease) 08/27/2010   R/P MV - LV normal in size, fixed mid-basal inferior defect is seen, in absence of gated images not possible to distinguish diaphragmatic atenuation from nontransmural infarction; no significant ischemia detected   Calculus of gallbladder without mention of cholecystitis or obstruction    Diverticulosis of colon (without mention of hemorrhage)    Esophageal reflux    Hiatal hernia    Hyperlipemia    Hypertension    Insomnia, unspecified    Memory loss    MI 08/27/2008   Qualifier: Diagnosis of   By: Kowalk CMA (AAMA), Leisha      Replacing diagnoses that were inactivated  after the 09/14/22 regulatory import     Myocardial infarct (HCC) 06/16/1987   inferior wall MI - total occlusionof distal RCA beyond PDA branch, treated w/ medical therapy   Osteoporosis, unspecified    Other symptoms involving abdomen and pelvis(789.9)    Pancreatitis    Personal history of colonic polyps 03/03/2010   tubular adenomas   Rheumatoid arthritis(714.0)    Stricture and stenosis of esophagus    Vitamin B12 deficiency     Past Surgical History:  Procedure Laterality Date   ANGIOPLASTY     APPENDECTOMY     AXILLARY SURGERY     BREAST BIOPSY Right 05/2006   CHOLECYSTECTOMY      Family History: Family History  Problem Relation Age of Onset   Diabetes Mother    Colon cancer Neg Hx     Social History  reports that she has never smoked. She has never used smokeless  tobacco. She reports that she does not drink alcohol and does not use drugs.  Allergies[1]  Medications  Current Medications[2]  Vitals   Vitals:   06/29/24 1500 2024-06-29 1610  Weight: 46.8 kg 49.9 kg  Height:  5' (1.524 m)    Body mass index is 21.48 kg/m.   Physical Exam   Constitutional: Well-developed, well-nourished elderly patient in no acute distress Psych: Affect appropriate to situation.  Eyes: No scleral injection.  HENT: No OP obstruction.  Head: Normocephalic.  Respiratory: Effort normal, non-labored breathing.  Skin: WDI.   Neurologic Examination    NEURO:  Mental Status: Is alert, able to state name but disoriented to time and situation Speech/Language: speech is without dysarthria or aphasia.  Naming and comprehension intact but speech is in single words and short phrases.  Cranial Nerves:  II: PERRL. Visual fields full.  III, IV, VI: EOMI. Eyelids elevate symmetrically.  V: Sensation is intact to light touch and symmetrical to face.  VII: Smile is symmetrical.  VIII: hearing intact to voice. IX, X: Phonation is normal.  Motor: Able to move all 4 extremities with antigravity strength, but drift present in the right arm which is somewhat contracted and in the left leg Tone: is normal and bulk is reduced Sensation- Intact to light touch bilaterally.  Gait- deferred   Labs/Imaging/Neurodiagnostic studies   CBC:  Recent Labs  Lab 06/29/24 1534  HGB 15.3*  HCT 45.0   Basic Metabolic Panel:  Lab Results  Component Value Date   NA 142 June 29, 2024   K 3.9 2024/06/29   CO2 26 04/15/2024   GLUCOSE 72 2024-06-29   BUN 13 Jun 29, 2024   CREATININE 0.70 06/29/2024   CALCIUM  8.9 04/15/2024   GFRNONAA >60 04/15/2024   Lipid Panel:  Lab Results  Component Value Date   LDLCALC 69 07/30/2011   Alcohol Level     Component Value Date/Time   ETH <10 02/13/2023 2010    CT Head without contrast(Personally reviewed): No acute abnormality, chronic  microvascular ischemic changes and volume loss  CT angio Head and Neck with contrast(Personally reviewed): No LVO or high-grade stenosis, right upper lobe nodules  MRI Brain(Personally reviewed): Pending  ASSESSMENT   Ashley Willis is a 89 y.o. female with hx of hypertension, hyperlipidemia, GERD, thrombocytopenia and restrictive lung disease who presents with a transient episode of unresponsiveness.  Patient was in her usual state of health earlier today when she suddenly closed her eyes, her mouth went slack and she stopped responding to her caregiver.  EMS was called, and patient began to respond  on the way to the hospital.  Patient's family reported that she had a similar episode about a year ago during hospitalization, but workup was not pursued due to patient's advanced age.  She has no history of seizures, and no twitching or jerking motions were noted during either episode.  Today's episode seems more consistent with syncope than stroke or seizure.  Patient's family states that at times she closes her eyes and does not respond to them and it is unclear if this behavior is volitional or not.  Recommend admission for cardiac monitoring and syncope workup.  Of note, on Thursday, patient developed left-sided weakness, left facial droop and inability to speak, although she was still able to walk.  These deficits resolved spontaneously by Saturday, and she was not brought to the hospital.  This episode could have represented an ischemic stroke, so we will obtain MRI brain and pursue stroke workup if positive.  RECOMMENDATIONS  -MRI brain, pursue stroke workup if positive - Recommend admission for syncope workup - Neurology will continue to follow ______________________________________________________________________  Patient seen by NP with MD, MD to edit note as needed.  Signed, Cortney E Everitt Clint Kill, NP [facility]   I have seen the patient and reviewed the above note.  The  description of the event, her slumping over with eyes closed lasting for a couple of minutes does not sound very consistent with seizure, however this would be difficult to rule out definitively.  She had a single previous episode of syncope, and antiepileptics have been discussed at that time and the family is hesitant to pursue that.  I discussed given her advanced age, whether she would want to come in for an acute workup in the family wanted to think about it.  I do think an MRI would be reasonable to see if her several days of left-sided weakness was stroke.  With the duration of her symptoms, if her MRI was negative I do not think that acute ischemia would be likely the culprit.  Ideally, given the duration of her syncope, I do think observation with cardiac monitoring and EEG would be prudent, but given her advanced age if a less aggressive approach was desired and MRI is negative, then it may be reasonable to pursue this as an outpatient with the understanding this entails risk.  Aisha Seals, MD [facility]   If 7pm- 7am, please page neurology on call as listed in AMION.      [1]  Allergies Allergen Reactions   Codeine    Donepezil     Other Reaction(s): fatigue, agitation  [2]  Current Facility-Administered Medications:    sodium chloride  flush (NS) 0.9 % injection 3 mL, 3 mL, Intravenous, Once, Curatolo, Adam, DO  Current Outpatient Medications:    cetirizine (ZYRTEC) 10 MG chewable tablet, Chew 10 mg by mouth daily., Disp: , Rfl:    cholecalciferol (VITAMIN D) 1000 UNITS tablet, Take 2,000 Units by mouth daily., Disp: , Rfl:    fluticasone  (FLONASE ) 50 MCG/ACT nasal spray, Place 1 spray into both nostrils daily. (Patient not taking: Reported on 04/14/2024), Disp: 16 g, Rfl: 6   ipratropium-albuterol  (DUONEB) 0.5-2.5 (3) MG/3ML SOLN, Inhale 3 mLs into the lungs every 6 (six) hours as needed (For shortness of breath). (Patient not taking: Reported on  04/14/2024), Disp: , Rfl:

## 2024-06-13 NOTE — Code Documentation (Signed)
 Stroke Response Nurse Documentation Code Documentation  Ashley Willis is a 89 y.o. female arriving to [facility]  via [facility] on 12/30 with past medical hx of HTN, HLD, GERD, thrombocytopenia. On No antithrombotic. Code stroke was activated by EMS.   Patient from home where she was LKW at 1200 and shortly thereafter had a period of unresponsiveness lasting approximately five minutes. On EMS arrival patient alert and conversational. Also states had L sided weakness and facial droop last week which resolved after a few days.   Stroke team at the bedside on patient arrival. Labs drawn and patient cleared for CT by Dr. Ruthe. Patient to CT with team. NIHSS 5, see documentation for details and code stroke times. Patient with disoriented, right arm weakness, and left leg weakness on exam. The following imaging was completed:  CT Head, CTA, and CTP. Patient is not a candidate for IV Thrombolytic due to likely stroke within the last three months. Patient is not a candidate for IR due to no LVO on CTA.   Care Plan: q2 NIHSS and vitals x 12 hours then q4. NPO until swallow screen obtained.   Bedside handoff with ED RN Rolin.    Lauraine LITTIE Searle  Stroke Response RN

## 2024-06-13 NOTE — ED Provider Notes (Signed)
 9:06 PM Patient signed out to me by previous ED physician. Pt is a 89 yo female presenting for unresponsive episode with slowness to respond after. Had left sided body weakness last week. Code stroke was called in the field. Possible seizure considered. Possible syncope.  In ED pt had no neurovascular deficits. Elderly and frail.   Neuro eval at bedside complete. CT stable. MRI pending.   Physical Exam  BP 131/64   Pulse 64   Temp 97.7 F (36.5 C) (Oral)   Resp 19   Ht 5' (1.524 m)   Wt 49.9 kg   SpO2 100%   BMI 21.48 kg/m   Physical Exam  Procedures  .Critical Care  Performed by: Elnor Bernarda SQUIBB, DO Authorized by: Elnor Bernarda SQUIBB, DO   Critical care provider statement:    Critical care time (minutes):  30   Critical care was necessary to treat or prevent imminent or life-threatening deterioration of the following conditions: stroke workup.   Critical care was time spent personally by me on the following activities:  Development of treatment plan with patient or surrogate, discussions with consultants, evaluation of patient's response to treatment, examination of patient, ordering and review of laboratory studies, ordering and review of radiographic studies, ordering and performing treatments and interventions, pulse oximetry, re-evaluation of patient's condition and review of old charts   Care discussed with: admitting provider     ED Course / MDM    Medical Decision Making Amount and/or Complexity of Data Reviewed Labs: ordered. Radiology: ordered.  Risk Decision regarding hospitalization.   CT stable CTA head and neck stable. MRI demonstrates chronic findings: 1. No acute findings. 2. Remote cortical infarcts in the bilateral frontal lobes. 3. Chronic microhemorrhages in the right parietal lobe. 4. Extensive chronic microvascular ischemic changes. 5. Moderate generalized parenchymal volume loss with slight temporal lobe predominance.   Laboratory studies are  stable including stable electrolytes.  Stable cardiac markers.  Stable twelve-lead EKG.  Stable renal function.  No signs or symptoms of infection or sepsis.  UA positive for hematuria but no signs of UTI.  Patient recommended for admission for syncope workup.  Neurology will continue to follow.  Patient accepted by hospitalist team.     Elnor Bernarda SQUIBB, DO 06/13/24 2106

## 2024-06-14 ENCOUNTER — Encounter (HOSPITAL_COMMUNITY): Payer: Self-pay | Admitting: Internal Medicine

## 2024-06-14 ENCOUNTER — Observation Stay (HOSPITAL_COMMUNITY)

## 2024-06-14 ENCOUNTER — Observation Stay (HOSPITAL_BASED_OUTPATIENT_CLINIC_OR_DEPARTMENT_OTHER)

## 2024-06-14 DIAGNOSIS — R404 Transient alteration of awareness: Secondary | ICD-10-CM | POA: Diagnosis not present

## 2024-06-14 DIAGNOSIS — R55 Syncope and collapse: Secondary | ICD-10-CM | POA: Diagnosis not present

## 2024-06-14 DIAGNOSIS — R569 Unspecified convulsions: Secondary | ICD-10-CM | POA: Diagnosis not present

## 2024-06-14 DIAGNOSIS — I5030 Unspecified diastolic (congestive) heart failure: Secondary | ICD-10-CM | POA: Diagnosis not present

## 2024-06-14 LAB — CREATININE, SERUM
Creatinine, Ser: 0.71 mg/dL (ref 0.44–1.00)
GFR, Estimated: >60 mL/min

## 2024-06-14 LAB — BASIC METABOLIC PANEL WITH GFR
Anion gap: 9 (ref 5–15)
BUN: 11 mg/dL (ref 8–23)
CO2: 26 mmol/L (ref 22–32)
Calcium: 9.3 mg/dL (ref 8.9–10.3)
Chloride: 102 mmol/L (ref 98–111)
Creatinine, Ser: 0.72 mg/dL (ref 0.44–1.00)
GFR, Estimated: >60 mL/min
Glucose, Bld: 85 mg/dL (ref 70–99)
Potassium: 4.2 mmol/L (ref 3.5–5.1)
Sodium: 137 mmol/L (ref 135–145)

## 2024-06-14 LAB — GLUCOSE, CAPILLARY: Glucose-Capillary: 66 mg/dL — ABNORMAL LOW (ref 70–99)

## 2024-06-14 LAB — CBC
HCT: 42.2 % (ref 36.0–46.0)
Hemoglobin: 13.3 g/dL (ref 12.0–15.0)
MCH: 27.8 pg (ref 26.0–34.0)
MCHC: 31.5 g/dL (ref 30.0–36.0)
MCV: 88.1 fL (ref 80.0–100.0)
Platelets: 194 K/uL (ref 150–400)
RBC: 4.79 MIL/uL (ref 3.87–5.11)
RDW: 12.9 % (ref 11.5–15.5)
WBC: 4.5 K/uL (ref 4.0–10.5)
nRBC: 0 % (ref 0.0–0.2)

## 2024-06-14 LAB — ECHOCARDIOGRAM COMPLETE
Area-P 1/2: 1.97 cm2
Calc EF: 44.3 %
Height: 60 in
S' Lateral: 3.3 cm
Single Plane A2C EF: 44.1 %
Single Plane A4C EF: 44.2 %
Weight: 1760 [oz_av]

## 2024-06-14 LAB — CBG MONITORING, ED
Glucose-Capillary: 123 mg/dL — ABNORMAL HIGH (ref 70–99)
Glucose-Capillary: 86 mg/dL (ref 70–99)

## 2024-06-14 MED ORDER — DEXTROSE-SODIUM CHLORIDE 5-0.9 % IV SOLN: INTRAVENOUS | Status: AC

## 2024-06-14 MED ORDER — ENOXAPARIN SODIUM 40 MG/0.4ML IJ SOSY
40.0000 mg | PREFILLED_SYRINGE | Freq: Every day | INTRAMUSCULAR | Status: DC
  Administered 2024-06-14 – 2024-06-15 (×2): 40 mg via SUBCUTANEOUS
  Filled 2024-06-14 (×2): qty 0.4

## 2024-06-14 MED ORDER — ACETAMINOPHEN 325 MG PO TABS: 650.0000 mg | ORAL_TABLET | Freq: Four times a day (QID) | ORAL | Status: DC | PRN

## 2024-06-14 MED ORDER — ACETAMINOPHEN 650 MG RE SUPP: 650.0000 mg | Freq: Four times a day (QID) | RECTAL | Status: DC | PRN

## 2024-06-14 NOTE — Progress Notes (Signed)
 Echocardiogram 2D Echocardiogram has been performed.  Ashley Willis 06/14/2024, 10:06 AM

## 2024-06-14 NOTE — ED Notes (Signed)
 PT awake and responds to questions appropriately.

## 2024-06-14 NOTE — Care Management Obs Status (Signed)
 MEDICARE OBSERVATION STATUS NOTIFICATION   Patient Details  Name: Ashley Willis MRN: [id number] Date of Birth: 1927/03/07   Medicare Observation Status Notification Given:  Yes    Camelia JONETTA Cary, RN 06/14/2024, 2:44 PM

## 2024-06-14 NOTE — Procedures (Signed)
 Patient Name: Ashley Willis  MRN: [id number]  Epilepsy Attending: Arlin MALVA Krebs  Referring Physician/Provider: Franky Redia SAILOR, MD  Date: 06/14/2024 Duration: 22.42 mins  Patient history: 89yo F with an unresponsive episode. EEG to evaluate for seizure  Level of alertness: Awake  AEDs during EEG study: None  Technical aspects: This EEG study was done with scalp electrodes positioned according to the 10-20 International system of electrode placement. Electrical activity was reviewed with band pass filter of 1-70Hz , sensitivity of 7 uV/mm, display speed of 84mm/sec with a 60Hz  notched filter applied as appropriate. EEG data were recorded continuously and digitally stored.  Video monitoring was available and reviewed as appropriate.  Description: The posterior dominant rhythm consists of 7 Hz activity of moderate voltage (25-35 uV) seen predominantly in posterior head regions, symmetric and reactive to eye opening and eye closing. EEG showed continuous generalized 5 to 7 Hz theta slowing.  Hyperventilation and photic stimulation were not performed.      ABNORMALITY - Continuous slow, generalized - Background slow   IMPRESSION: This study is suggestive of generalized cerebral dysfunction (encephalopathy). No seizures or definite epileptiform discharges were seen throughout the recording.     Foye Damron O Solveig Fangman

## 2024-06-14 NOTE — H&P (Signed)
 " History and Physical    Ashley Willis FMW:[id number] DOB: 1926-10-28 DOA: 06/13/2024  Patient coming from: Home.  Chief Complaint: Unresponsive episode.  History obtained from patient's caregiver.  HPI: Ashley Willis is a 89 y.o. female with history of CAD presently not on any medications, cognitive impairment was brought to the ER after patient's caregiver found her at around noon time patient was less responsive and not following commands as usually she does.  It lasted for few minutes and EMS was called and patient was brought to the ER.  As per the caregiver patient's mentation improved by the time she reached the ER and the total episode might have lasted for about 10 minutes.  Patient had not complained of any chest pain shortness of breath nausea vomiting diarrhea.  Patient was recently admitted in October 2025 for sepsis from UTI.  ED Course: In the ER patient had CT head followed by CT angiogram head and neck.  Did not show any acute findings or any large vessel obstruction.  Neurologist was consulted and requested getting MRI brain and if positive for stroke further stroke workup.  MRI brain was negative for stroke.  Admitted for further workup for possible syncope.  Labs are largely unremarkable EKG shows normal sinus rhythm.  At the time of my exam patient is following commands and the caregiver who is at the bedside feels that patient's mentation is back at baseline.  Review of Systems: As per HPI, rest all negative.   Past Medical History:  Diagnosis Date   Anemia    CAD (coronary artery disease) 07/14/2006   ECHO - EF >55%; borderline concentric LVH; LA mildly dilated, mild tricuspid regurgitation   CAD (coronary artery disease) 08/27/2010   R/P MV - LV normal in size, fixed mid-basal inferior defect is seen, in absence of gated images not possible to distinguish diaphragmatic atenuation from nontransmural infarction; no significant ischemia detected   Calculus of  gallbladder without mention of cholecystitis or obstruction    Diverticulosis of colon (without mention of hemorrhage)    Esophageal reflux    Hiatal hernia    Hyperlipemia    Hypertension    Insomnia, unspecified    Memory loss    MI 08/27/2008   Qualifier: Diagnosis of   By: [place] CMA (AAMA), Chick      Replacing diagnoses that were inactivated after the 09/14/22 regulatory import     Myocardial infarct (HCC) 06/16/1987   inferior wall MI - total occlusionof distal RCA beyond PDA branch, treated w/ medical therapy   Osteoporosis, unspecified    Other symptoms involving abdomen and pelvis(789.9)    Pancreatitis    Personal history of colonic polyps 03/03/2010   tubular adenomas   Rheumatoid arthritis(714.0)    Stricture and stenosis of esophagus    Vitamin B12 deficiency     Past Surgical History:  Procedure Laterality Date   ANGIOPLASTY     APPENDECTOMY     AXILLARY SURGERY     BREAST BIOPSY Right 05/2006   CHOLECYSTECTOMY       reports that she has never smoked. She has never used smokeless tobacco. She reports that she does not drink alcohol and does not use drugs.  Allergies[1]  Family History  Problem Relation Age of Onset   Diabetes Mother    Colon cancer Neg Hx     Prior to Admission medications  Medication Sig Start Date End Date Taking? Authorizing Provider  cetirizine (ZYRTEC) 10 MG chewable tablet Chew 10 mg  by mouth daily.   Yes [provider]  cholecalciferol (VITAMIN D) 1000 UNITS tablet Take 2,000 Units by mouth daily.   Yes [provider]    Physical Exam: Constitutional: Moderately built and nourished. Vitals:   06/13/24 2115 06/13/24 2200 06/13/24 2326 06/13/24 2345  BP: (!) 164/79 (!) 132/59  135/60  Pulse: 97 66  77  Resp: 14 (!) 22  (!) 26  Temp:   98.1 F (36.7 C)   TempSrc:   Oral   SpO2: 98% 97%  97%  Weight:      Height:       Eyes: Anicteric no pallor. ENMT: No discharge from the ears eyes nose or  mouth. Neck: No mass felt.  No neck rigidity. Respiratory: No rhonchi or crepitations. Cardiovascular: S1-S2 heard. Abdomen: Soft nontender bowel sound present. Musculoskeletal: No edema. Skin: No rash. Neurologic: Patient is following commands and answering questions.  She states she is in the hospital.  Per caregiver this is her baseline mental status. Psychiatric: Oriented to her name and place.   Labs on Admission: I have personally reviewed following labs and imaging studies  CBC: Recent Labs  Lab 06/13/24 1534 06/13/24 1732  WBC  --  5.1  NEUTROABS  --  2.0  HGB 15.3* 13.1  HCT 45.0 41.7  MCV  --  87.6  PLT  --  187   Basic Metabolic Panel: Recent Labs  Lab 06/13/24 1534 06/13/24 1732  NA 142 136  K 3.9 3.8  CL 100 101  CO2  --  26  GLUCOSE 72 77  BUN 13 12  CREATININE 0.70 0.71  CALCIUM   --  9.2   GFR: Estimated Creatinine Clearance: 30.2 mL/min (by C-G formula based on SCr of 0.71 mg/dL). Liver Function Tests: Recent Labs  Lab 06/13/24 1732  AST 29  ALT 13  ALKPHOS 61  BILITOT 0.4  PROT 8.3*  ALBUMIN 3.7   No results for input(s): LIPASE, AMYLASE in the last 168 hours. No results for input(s): AMMONIA in the last 168 hours. Coagulation Profile: Recent Labs  Lab 06/13/24 1732  INR 1.1   Cardiac Enzymes: No results for input(s): CKTOTAL, CKMB, CKMBINDEX, TROPONINI in the last 168 hours. BNP (last 3 results) No results for input(s): PROBNP in the last 8760 hours. HbA1C: No results for input(s): HGBA1C in the last 72 hours. CBG: No results for input(s): GLUCAP in the last 168 hours. Lipid Profile: No results for input(s): CHOL, HDL, LDLCALC, TRIG, CHOLHDL, LDLDIRECT in the last 72 hours. Thyroid  Function Tests: No results for input(s): TSH, T4TOTAL, FREET4, T3FREE, THYROIDAB in the last 72 hours. Anemia Panel: No results for input(s): VITAMINB12, FOLATE, FERRITIN, TIBC, IRON, RETICCTPCT  in the last 72 hours. Urine analysis:    Component Value Date/Time   COLORURINE YELLOW 06/13/2024 1815   APPEARANCEUR CLEAR 06/13/2024 1815   APPEARANCEUR Cloudy (A) 12/20/2020 1141   LABSPEC 1.035 (H) 06/13/2024 1815   PHURINE 7.0 06/13/2024 1815   GLUCOSEU NEGATIVE 06/13/2024 1815   HGBUR MODERATE (A) 06/13/2024 1815   BILIRUBINUR NEGATIVE 06/13/2024 1815   BILIRUBINUR Negative 12/20/2020 1141   KETONESUR NEGATIVE 06/13/2024 1815   PROTEINUR NEGATIVE 06/13/2024 1815   NITRITE NEGATIVE 06/13/2024 1815   LEUKOCYTESUR NEGATIVE 06/13/2024 1815   Sepsis Labs: @LABRCNTIP (procalcitonin:4,lacticidven:4) )No results found for this or any previous visit (from the past 240 hours).   EKG: Independently reviewed.  Normal sinus rhythm.  Assessment/Plan Principal Problem:   Unresponsive episode Active Problems:   CORONARY ARTERY  DISEASE   Memory loss    Unresponsive episode -     appreciate neurology consult.  MRI brain is negative for stroke.  Will check EEG and 2D echo monitor in telemetry. Prior history of CAD presently not on any medication. History of cognitive impairment no acute issues at this time.  DVT prophylaxis: Lovenox . Code Status: Full code. Family Communication: Patient's caregiver at the bedside. Disposition Plan: Monitor bed. Consults called: Neurologist. Admission status: Observation.         [1]  Allergies Allergen Reactions   Codeine    Donepezil     Other Reaction(s): fatigue, agitation   "

## 2024-06-14 NOTE — Progress Notes (Signed)
 NEUROLOGY CONSULT FOLLOW UP NOTE   Date of service: June 14, 2024 Patient Name: Ashley Willis MRN:  [id number] DOB:  [birth year]  Interval Hx/subjective   Seen in room.  Initially resting comfortably with eyes closed.  Asks for overhead light to be turned off when examiner turned on.  She states she is doing just fine Vitals   Vitals:   06/14/24 0410 06/14/24 0500 06/14/24 0559 06/14/24 0600  BP:   (!) 147/62 (!) 153/51  Pulse:  (!) 58 65 68  Resp:  15 (!) 21 14  Temp: 97.6 F (36.4 C)     TempSrc: Oral     SpO2:  100% 99% 100%  Weight:      Height:         Body mass index is 21.48 kg/m.  Physical Exam   Constitutional: Well-developed, well-nourished elderly patient in no acute distress Psych: Affect appropriate to situation.  Eyes: No scleral injection.  HENT: No OP obstruction. Top dentures in place Head: Normocephalic.  Respiratory: Effort normal, non-labored breathing.  Skin: WDI.    Neurologic Examination      NEURO:  Mental Status: Is alert, able to state name but disoriented to time and situation. States she is 32. States we areat home Speech/Language: speech is without dysarthria or aphasia.  Naming and comprehension intact but speech is in single words and short phrases.   Cranial Nerves:  II: PERRL. Visual fields full.  III, IV, VI: EOMI. Eyelids elevate symmetrically.  V: Sensation is intact to light touch and symmetrical to face.  VII: Smile is symmetrical.  VIII: hearing intact to voice. IX, X: Phonation is normal.  Motor: Able to move all 4 extremities with antigravity strength, but drift present in the right arm which is somewhat contracted and in the left leg Tone: is normal and bulk is reduced Sensation- Intact to light touch bilaterally.  Gait- deferred  Medications Current Medications[1]  Labs and Diagnostic Imaging   CBC:  Recent Labs  Lab 06/13/24 1732 06/14/24 0409  WBC 5.1 4.5  NEUTROABS 2.0  --   HGB 13.1 13.3  HCT  41.7 42.2  MCV 87.6 88.1  PLT 187 194    Basic Metabolic Panel:  Lab Results  Component Value Date   NA 137 06/14/2024   K 4.2 06/14/2024   CO2 26 06/14/2024   GLUCOSE 85 06/14/2024   BUN 11 06/14/2024   CREATININE 0.72 06/14/2024   CALCIUM  9.3 06/14/2024   GFRNONAA >60 06/14/2024   Lipid Panel:  Lab Results  Component Value Date   LDLCALC 69 07/30/2011   HgbA1c: No results found for: HGBA1C Urine Drug Screen: No results found for: LABOPIA, COCAINSCRNUR, LABBENZ, AMPHETMU, THCU, LABBARB  Alcohol Level     Component Value Date/Time   ETH <15 06/13/2024 1732   INR  Lab Results  Component Value Date   INR 1.1 06/13/2024   APTT  Lab Results  Component Value Date   APTT 29 06/13/2024   AED levels: No results found for: PHENYTOIN, ZONISAMIDE, LAMOTRIGINE, LEVETIRACETA  CT Head without contrast(Personally reviewed): No acute abnormality, chronic microvascular ischemic changes and volume loss   CT angio Head and Neck with contrast(Personally reviewed): No LVO or high-grade stenosis, right upper lobe nodules  MRI Brain(Personally reviewed): 1. No acute findings. 2. Remote cortical infarcts in the bilateral frontal lobes. 3. Chronic microhemorrhages in the right parietal lobe. 4. Extensive chronic microvascular ischemic changes. 5. Moderate generalized parenchymal volume loss with slight temporal lobe predominance.  Assessment   Ashley Willis is a 89 y.o. female  hx of hypertension, hyperlipidemia, GERD, thrombocytopenia and restrictive lung disease who presents with a transient episode of unresponsiveness consistent with syncope. She is currently back to baseline. No further episodes reported by staff. EEG results pending. MRI negative for acute infarct.   Episodes consistent with syncope.  Recommendations  - EEG, neurology will sign off if EEG is  negative ______________________________________________________________________   Signed, Jorene Last, NP [facility]   I have seen the patient reviewed the above note.  She had an episode of loss of consciousness, that did last long for cardiac syncope, but without other features concerning for seizure.  I would not start antiepileptic therapy unless we have ancillary supporting evidence for a seizure predisposition with EEG.  Family is also very hesitant to start seizure medicine even if we find this.  She has had some mild bradycardia into the 50s, but I will defer to internal medicine as to whether further investigation of this is needed.  If EEG is negative, neurology will have no further recommendations and we will sign off.  Aisha Seals, MD [facility]   If 7pm- 7am, please page neurology on call as listed in AMION.     [1]  Current Facility-Administered Medications:    acetaminophen  (TYLENOL ) tablet 650 mg, 650 mg, Oral, Q6H PRN **OR** acetaminophen  (TYLENOL ) suppository 650 mg, 650 mg, Rectal, Q6H PRN, Franky Redia SAILOR, MD   enoxaparin  (LOVENOX ) injection 40 mg, 40 mg, Subcutaneous, Daily, Franky Redia SAILOR, MD  Current Outpatient Medications:    cetirizine (ZYRTEC) 10 MG chewable tablet, Chew 10 mg by mouth daily., Disp: , Rfl:    cholecalciferol (VITAMIN D) 1000 UNITS tablet, Take 2,000 Units by mouth daily., Disp: , Rfl:

## 2024-06-14 NOTE — Progress Notes (Signed)
" °   06/14/24 1444  TOC ED Mini Assessment  TOC Time spent with patient (minutes): 20  Admission or Readmission Diverted Yes  Means of departure Car  CMS Medicare.gov Compare Post Acute Care list provided to: Patient Represenative (must comment)  Choice offered to / list presented to  Adult Children    MOON letter signed and given.   Transition of Care [facility]) - Emergency Department Mini Assessment   Patient Details  Name: Ashley Willis MRN: [id number] Date of Birth: 1926/06/24  Transition [facility]) CM/SW Contact:    Camelia JONETTA Cary, RN Phone Number: 06/14/2024, 2:45 PM   Clinical Narrative:  Spoke to patient's daughter Jethro Sprang) regarding d/c plans. Pt. Will return home, transported by daughter. Patient has walker, hospital bed.    ED Mini Assessment:          Means of departure: (P) Car       Patient Contact and Communications        ,            CMS Medicare.gov Compare Post Acute Care list provided to:: (P) Patient Represenative (must comment) Choice offered to / list presented to : (P) Adult Children  Admission diagnosis:  Unresponsive episode [R40.4] Patient Active Problem List   Diagnosis Date Noted   Unresponsive episode 06/13/2024   Dyslipidemia 04/14/2024   Vitamin D deficiency 04/14/2024   Thrombocytopenia 04/14/2024   Lumbar spondylosis 04/14/2024   Vulvar candidiasis 04/14/2024   Restrictive lung disease 04/14/2024   Acute metabolic encephalopathy 04/14/2024   Elevated troponin 04/14/2024   Sepsis secondary to UTI (HCC) 04/14/2024   Impacted cerumen of both ears 06/07/2023   Fall at home, initial encounter 02/14/2023   Spell of altered consciousness 02/14/2023   Paronychia of great toe of right foot 12/14/2022   Dementia without behavioral disturbance (HCC) 11/07/2022   Generalized weakness 11/07/2022   Chronic cough 11/07/2022   Left upper lobe pulmonary nodule 11/07/2022   OSTEOPOROSIS 10/09/2009   VITAMIN B12  DEFICIENCY 02/25/2009   GALLSTONES 08/28/2008   Memory loss 08/28/2008   ANEMIA, IRON DEFICIENCY 08/27/2008   ESOPHAGEAL STRICTURE 08/27/2008   GERD 08/27/2008   HIATAL HERNIA 08/27/2008   DIVERTICULOSIS, COLON 08/27/2008   EARLY SATIETY 08/27/2008   HYPERLIPIDEMIA 05/13/2007   HYPERTENSION 05/13/2007   CORONARY ARTERY DISEASE 05/13/2007   RHEUMATOID ARTHRITIS 05/13/2007   INSOMNIA 05/13/2007   PANCREATITIS, HX OF 05/13/2007   History of colonic polyps 05/13/2007   POLYPECTOMY, HX OF 05/13/2007   TAH/BSO, HX OF 05/13/2007   PCP:  Leonel Cole, MD Pharmacy:   [facility] [address] [place] - [address] [place] [address] Phone: [phone] Fax: [phone]  [facility] - [place], [place] - [address] [place] [address] Phone: [phone] Fax: [phone]   "

## 2024-06-14 NOTE — ED Notes (Signed)
 Pt had an emesis episode, approximately of applesauce and grape juice.

## 2024-06-14 NOTE — Progress Notes (Signed)
 EEG complete - results pending

## 2024-06-14 NOTE — Progress Notes (Signed)
 " PROGRESS NOTE    Ashley Willis  FMW:[id number] DOB: [birth year] DOA: 06/13/2024 PCP: Leonel Cole, MD  Subjective: Patient wakes up to noxious stimuli, otherwise sleeping mostly. Per daughter, she does sleep quite a bit at home, but is usually able to wake up and walk short distances with assistance     Hospital Course: 89 y.o. female with history of CAD presently not on any medications, cognitive impairment was brought to the ER after patient's caregiver found her at around noon time patient was less responsive and not following commands as usually she does.  It lasted for few minutes and EMS was called and patient was brought to the ER.  As per the caregiver patient's mentation improved by the time she reached the ER and the total episode might have lasted for about 10 minutes.  Patient had not complained of any chest pain shortness of breath nausea vomiting diarrhea.  Patient was recently admitted in October 2025 for sepsis from UTI. CT head, CTA head and neck, MRI brain did not show acute findings.    Assessment and Plan:  Unresponsive episode - likely from syncope, but etiology is not clear. Per daughter she is back to her baseline but she has been very sleepy and needed noxious stimulus to wake up later in the day. CT head, CTA head and neck, MRI brain did not show acute findings. TTE completed with EF 55-60% and grade I diastolic dysfunction. No fever or leukocytosis to suggest infection  - seen by neurology, once EEG results and negative, no further workup needed  - will give IVF for today, given the poor PO intake and emesis earlier  - monitor on tele      DVT prophylaxis: enoxaparin  (LOVENOX ) injection 40 mg Start: 06/14/24 1000    Code Status: Full Code Family Communication: Daughter updated at bedside Disposition Plan: Home with daughter/caregiver Reason for continuing need for hospitalization: Lethargic  Objective: Vitals:   06/14/24 1100 06/14/24 1115 06/14/24 1213  06/14/24 1400  BP: 133/80   (!) 143/56  Pulse: 87 60  (!) 56  Resp: (!) 27 17  17   Temp:   97.9 F (36.6 C)   TempSrc:   Oral   SpO2: 100% 99%  100%  Weight:      Height:       No intake or output data in the 24 hours ending 06/14/24 1455 Filed Weights   06/13/24 1500 06/13/24 1610  Weight: 46.8 kg 49.9 kg    Examination:  Physical Exam Vitals and nursing note reviewed.  Constitutional:      Comments: Elderly and frail appearing   Cardiovascular:     Rate and Rhythm: Normal rate.  Pulmonary:     Effort: No respiratory distress.     Breath sounds: No wheezing.  Abdominal:     General: There is no distension.  Musculoskeletal:     Right lower leg: No edema.     Left lower leg: No edema.  Neurological:     Comments: Lethargic      Data Reviewed: I have personally reviewed following labs and imaging studies  CBC: Recent Labs  Lab 06/13/24 1534 06/13/24 1732 06/14/24 0409  WBC  --  5.1 4.5  NEUTROABS  --  2.0  --   HGB 15.3* 13.1 13.3  HCT 45.0 41.7 42.2  MCV  --  87.6 88.1  PLT  --  187 194   Basic Metabolic Panel: Recent Labs  Lab 06/13/24 1534 06/13/24 1732 06/14/24 0409  NA 142 136 137  K 3.9 3.8 4.2  CL 100 101 102  CO2  --  26 26  GLUCOSE 72 77 85  BUN 13 12 11   CREATININE 0.70 0.71 0.72  CALCIUM   --  9.2 9.3   GFR: Estimated Creatinine Clearance: 30.2 mL/min (by C-G formula based on SCr of 0.72 mg/dL). Liver Function Tests: Recent Labs  Lab 06/13/24 1732  AST 29  ALT 13  ALKPHOS 61  BILITOT 0.4  PROT 8.3*  ALBUMIN 3.7   No results for input(s): LIPASE, AMYLASE in the last 168 hours. No results for input(s): AMMONIA in the last 168 hours. Coagulation Profile: Recent Labs  Lab 06/13/24 1732  INR 1.1   Cardiac Enzymes: No results for input(s): CKTOTAL, CKMB, CKMBINDEX, TROPONINI in the last 168 hours. ProBNP, BNP (last 5 results) No results for input(s): PROBNP, BNP in the last 8760 hours. HbA1C: No results  for input(s): HGBA1C in the last 72 hours. CBG: Recent Labs  Lab 06/13/24 1528 06/14/24 1057 06/14/24 1428  GLUCAP 66* 123* 86   Lipid Profile: No results for input(s): CHOL, HDL, LDLCALC, TRIG, CHOLHDL, LDLDIRECT in the last 72 hours. Thyroid  Function Tests: No results for input(s): TSH, T4TOTAL, FREET4, T3FREE, THYROIDAB in the last 72 hours. Anemia Panel: No results for input(s): VITAMINB12, FOLATE, FERRITIN, TIBC, IRON, RETICCTPCT in the last 72 hours. Sepsis Labs: No results for input(s): PROCALCITON, LATICACIDVEN in the last 168 hours.  No results found for this or any previous visit (from the past 240 hours).   Radiology Studies: ECHOCARDIOGRAM COMPLETE Result Date: 06/14/2024    ECHOCARDIOGRAM REPORT   Patient Name:   Ashley Willis Date of Exam: 06/14/2024 Medical Rec #:  [id number]      Height:       60.0 in Accession #:    [id number]     Weight:       110.0 lb Date of Birth:  March 27, 1927      BSA:          1.448 m Patient Age:    89 years       BP:           172/52 mmHg Patient Gender: F              HR:           71 bpm. Exam Location:  Inpatient Procedure: 2D Echo, Cardiac Doppler and Color Doppler (Both Spectral and Color            Flow Doppler were utilized during procedure). Indications:    Syncope R55  History:        Patient has prior history of Echocardiogram examinations, most                 recent 02/04/2021. CAD; Risk Factors:Hypertension and                 Dyslipidemia.  Sonographer:    Merlynn Argyle Referring Phys: [id number] REDIA LOISE CLEAVER  Sonographer Comments: Image acquisition challenging due to uncooperative patient and Image acquisition challenging due to respiratory motion. IMPRESSIONS  1. Left ventricular ejection fraction, by estimation, is 55 to 60%. The left ventricle has normal function. The left ventricle has no regional wall motion abnormalities. Left ventricular diastolic parameters are consistent with Grade I diastolic  dysfunction (impaired relaxation).  2. Right ventricular systolic function is normal. The right ventricular size is normal.  3. The mitral valve is normal in structure. Trivial mitral valve regurgitation. No  evidence of mitral stenosis.  4. The aortic valve is tricuspid. Aortic valve regurgitation is mild. No aortic stenosis is present.  5. The inferior vena cava is normal in size with greater than 50% respiratory variability, suggesting right atrial pressure of 3 mmHg. FINDINGS  Left Ventricle: Left ventricular ejection fraction, by estimation, is 55 to 60%. The left ventricle has normal function. The left ventricle has no regional wall motion abnormalities. The left ventricular internal cavity size was normal in size. There is  no left ventricular hypertrophy. Left ventricular diastolic parameters are consistent with Grade I diastolic dysfunction (impaired relaxation). Right Ventricle: The right ventricular size is normal. Right ventricular systolic function is normal. Left Atrium: Left atrial size was normal in size. Right Atrium: Right atrial size was normal in size. Pericardium: There is no evidence of pericardial effusion. Mitral Valve: The mitral valve is normal in structure. Mild mitral annular calcification. Trivial mitral valve regurgitation. No evidence of mitral valve stenosis. Tricuspid Valve: The tricuspid valve is normal in structure. Tricuspid valve regurgitation is trivial. No evidence of tricuspid stenosis. Aortic Valve: The aortic valve is tricuspid. Aortic valve regurgitation is mild. No aortic stenosis is present. Pulmonic Valve: The pulmonic valve was normal in structure. Pulmonic valve regurgitation is not visualized. No evidence of pulmonic stenosis. Aorta: The aortic root is normal in size and structure. Venous: The inferior vena cava is normal in size with greater than 50% respiratory variability, suggesting right atrial pressure of 3 mmHg. IAS/Shunts: The interatrial septum is aneurysmal.  No atrial level shunt detected by color flow Doppler.  LEFT VENTRICLE PLAX 2D LVIDd:         4.50 cm     Diastology LVIDs:         3.30 cm     LV e' medial:    5.33 cm/s LV PW:         1.00 cm     LV E/e' medial:  7.0 LV IVS:        1.00 cm     LV e' lateral:   6.42 cm/s LVOT diam:     1.80 cm     LV E/e' lateral: 5.8 LV SV:         35 LV SV Index:   24 LVOT Area:     2.54 cm  LV Volumes (MOD) LV vol d, MOD A2C: 56.9 ml LV vol d, MOD A4C: 49.1 ml LV vol s, MOD A2C: 31.8 ml LV vol s, MOD A4C: 27.4 ml LV SV MOD A2C:     25.1 ml LV SV MOD A4C:     49.1 ml LV SV MOD BP:      23.7 ml RIGHT VENTRICLE RV S prime:     15.10 cm/s TAPSE (M-mode): 1.7 cm LEFT ATRIUM             Index        RIGHT ATRIUM          Index LA diam:        2.30 cm 1.59 cm/m   RA Area:     6.58 cm LA Vol (A2C):   13.8 ml 9.53 ml/m   RA Volume:   7.90 ml  5.46 ml/m LA Vol (A4C):   14.7 ml 10.15 ml/m LA Biplane Vol: 15.0 ml 10.36 ml/m  AORTIC VALVE LVOT Vmax:   76.60 cm/s LVOT Vmean:  49.500 cm/s LVOT VTI:    0.136 m  AORTA Ao Root diam: 3.00 cm Ao Asc diam:  3.20 cm MITRAL VALVE MV Area (PHT): 1.97 cm     SHUNTS MV Decel Time: 386 msec     Systemic VTI:  0.14 m MV E velocity: 37.20 cm/s   Systemic Diam: 1.80 cm MV A velocity: 101.00 cm/s MV E/A ratio:  0.37 Redell Shallow MD Electronically signed by Redell Shallow MD Signature Date/Time: 06/14/2024/11:32:10 AM    Final    MR BRAIN WO CONTRAST Result Date: 06/13/2024 EXAM: MRI BRAIN WITHOUT CONTRAST 06/13/2024 05:18:00 PM TECHNIQUE: Multiplanar multisequence MRI of the head/brain was performed without the administration of intravenous contrast. COMPARISON: Same day CT head and CTA head and neck. CLINICAL HISTORY: Stroke, follow up. FINDINGS: BRAIN AND VENTRICLES: Scattered and confluent T2 and FLAIR hyperintensity in the periventricular and subcortical white matter with slight asymmetric predominance in the frontal lobes, suggestive of extensive chronic microvascular ischemic changes. There  is moderate generalized parenchymal volume loss with slight temporal lobe predominance. Remote cortical infarcts in the bilateral frontal lobes. Chronic microhemorrhages in the right parietal lobe. No acute infarct. No acute intracranial hemorrhage. No mass. No midline shift. No hydrocephalus. The sella is unremarkable. Normal flow voids. ORBITS: Bilateral lens replacements. SINUSES AND MASTOIDS: No acute abnormality. BONES AND SOFT TISSUES: Normal marrow signal. No acute soft tissue abnormality. IMPRESSION: 1. No acute findings. 2. Remote cortical infarcts in the bilateral frontal lobes. 3. Chronic microhemorrhages in the right parietal lobe. 4. Extensive chronic microvascular ischemic changes. 5. Moderate generalized parenchymal volume loss with slight temporal lobe predominance. Electronically signed by: Donnice Mania MD 06/13/2024 06:32 PM EST RP Workstation: [id number]   DG Chest Portable 1 View Result Date: 06/13/2024 EXAM: 1 VIEW(S) XRAY OF THE CHEST 06/13/2024 04:14:00 PM COMPARISON: 04/14/2024 CLINICAL HISTORY: ams FINDINGS: LUNGS AND PLEURA: Low lung volumes. Coarse reticular opacities. New bibasilar patchy opacities. No pleural effusion. No pneumothorax. HEART AND MEDIASTINUM: Atherosclerotic calcifications. No acute abnormality of the cardiac and mediastinal silhouettes. BONES AND SOFT TISSUES: No acute osseous abnormality. IMPRESSION: 1. New bibasilar patchy opacities favored to represent atelectasis related to low lung volumes. Infection not excluded. Electronically signed by: Greig Pique MD 06/13/2024 05:16 PM EST RP Workstation: [id number]   CT ANGIO HEAD NECK W WO CM (CODE STROKE) Result Date: 06/13/2024 EXAM: CTA Head and Neck with Intravenous Contrast. CLINICAL HISTORY: Neuro deficit, acute, stroke suspected. TECHNIQUE: Axial CTA images of the head and neck performed without and with intravenous contrast. MIP reconstructed images were created and reviewed. Axial computed tomography images  of the head/brain performed without intravenous contrast. Note: Per PQRS, the description of internal carotid artery percent stenosis, including 0 percent or normal exam, is based on [place] Symptomatic Carotid Endarterectomy Trial (NASCET) criteria. Dose reduction technique was used including one or more of the following: automated exposure control, adjustment of mA and kV according to patient size, and/or iterative reconstruction. CONTRAST: Without and with; 75mL (iohexol  (OMNIPAQUE ) 350 MG/ML injection 75 mL IOHEXOL  350 MG/ML SOLN). COMPARISON: Same day CT head. FINDINGS: CTA NECK: COMMON CAROTID ARTERIES: Calcified atherosclerosis at the right carotid bifurcation without hemodynamically significant stenosis. Atherosclerosis at the left carotid bifurcation without hemodynamically significant stenosis. No dissection or occlusion. INTERNAL CAROTID ARTERIES: There is moderate tortuosity of the distal left cervical ICA. No stenosis by NASCET criteria. No dissection or occlusion. VERTEBRAL ARTERIES: The left vertebral artery is dominant. Atherosclerosis at the left vertebral artery origin without significant stenosis. Tortuosity of the left V1 segment. No dissection or occlusion. OTHER NECK VESSELS: Mild atherosclerosis of the aortic arch. Mild atherosclerosis of the  bilateral subclavian arteries without significant stenosis. CTA HEAD: ANTERIOR CEREBRAL ARTERIES: Diminutive appearance of the right A1 segment which may be secondary to atherosclerosis versus congenital. No significant stenosis. No occlusion. No aneurysm. MIDDLE CEREBRAL ARTERIES: No significant stenosis. No occlusion. No aneurysm. POSTERIOR CEREBRAL ARTERIES: No significant stenosis. No occlusion. No aneurysm. BASILAR ARTERY: No significant stenosis. No occlusion. No aneurysm. OTHER INTRACRANIAL VESSELS: There is mild atherosclerosis of the carotid siphons without significant stenosis. OTHER: LUNGS (visualized portions): There is a 0.5 cm nodule  in the posterior right upper lobe. 0.3 cm nodule more anteriorly within the right upper lobe. SOFT TISSUES: No acute finding. No masses or lymphadenopathy. BONES: Degenerative changes in the visualized spine. Edentulous maxilla and mandible. No acute osseous abnormality. IMPRESSION: 1. No acute large vessel occlusion. 2. Mild atherosclerosis as above. No high-grade stenosis. 3. Right upper lobe nodules measuring up to 0.5 cm in diameter. Consider optional follow up CT chest in 12 months to document stability. Electronically signed by: Donnice Mania MD 06/13/2024 04:22 PM EST RP Workstation: [id number]   CT HEAD CODE STROKE WO CONTRAST Addendum Date: 06/13/2024 ** ADDENDUM #1 ** ADDENDUM: Findings discussed with Dr. Michaela at 3:55 PM on 06/13/24. ---------------------------------------------------- Electronically signed by: Donnice Mania MD 06/13/2024 03:59 PM EST RP Workstation: [id number]   Result Date: 06/13/2024 ** ORIGINAL REPORT ** EXAM: CT HEAD WITHOUT 06/13/2024 03:34:06 PM TECHNIQUE: CT of the head was performed without the administration of intravenous contrast. Automated exposure control, iterative reconstruction, and/or weight based adjustment of the mA/kV was utilized to reduce the radiation dose to as low as reasonably achievable. COMPARISON: 04/14/2024 CLINICAL HISTORY: Neuro deficit, acute, stroke suspected FINDINGS: BRAIN AND VENTRICLES: No acute intracranial hemorrhage. No mass effect or midline shift. No extra-axial fluid collection. No evidence of acute infarct. No hydrocephalus. Severe chronic microvascular ischemic change. Moderate generalized parenchymal volume loss with slight temporal lobe predominance. Atherosclerosis at the skull base. [place] Stroke Program Early CT (ASPECT) score: Ganglionic (caudate, IC, lentiform nucleus, insula, M1-M3): 7. Supraganglionic (M4-M6): 3. Total: 10. ORBITS: No acute abnormality. Bilateral lens replacement. SINUSES AND MASTOIDS: No acute  abnormality. SOFT TISSUES AND SKULL: No acute skull fracture. No acute soft tissue abnormality. IMPRESSION: 1. No acute intracranial abnormality. 2. ASPECTS of 10. 3. Severe chronic microvascular ischemic change. Moderate cerebral volume loss with temporal predominance. Electronically signed by: Donnice Mania MD 06/13/2024 03:51 PM EST RP Workstation: [id number]    Scheduled Meds:  enoxaparin  (LOVENOX ) injection  40 mg Subcutaneous Daily   Continuous Infusions:  dextrose 5 % and 0.9 % NaCl       LOS: 0 days   Time spent: 40 minutes  Casimer Dare, MD  [facility]  06/14/2024, 2:55 PM   "

## 2024-06-15 DIAGNOSIS — M810 Age-related osteoporosis without current pathological fracture: Secondary | ICD-10-CM | POA: Diagnosis not present

## 2024-06-15 DIAGNOSIS — R410 Disorientation, unspecified: Secondary | ICD-10-CM | POA: Diagnosis not present

## 2024-06-15 DIAGNOSIS — I10 Essential (primary) hypertension: Secondary | ICD-10-CM | POA: Diagnosis not present

## 2024-06-15 DIAGNOSIS — R404 Transient alteration of awareness: Secondary | ICD-10-CM | POA: Diagnosis present

## 2024-06-15 DIAGNOSIS — I2510 Atherosclerotic heart disease of native coronary artery without angina pectoris: Secondary | ICD-10-CM | POA: Diagnosis not present

## 2024-06-15 DIAGNOSIS — Z8659 Personal history of other mental and behavioral disorders: Secondary | ICD-10-CM | POA: Diagnosis not present

## 2024-06-15 NOTE — Plan of Care (Signed)
  Problem: Clinical Measurements: Goal: Ability to maintain clinical measurements within normal limits will improve Outcome: Progressing Goal: Will remain free from infection Outcome: Progressing Goal: Diagnostic test results will improve Outcome: Progressing Goal: Respiratory complications will improve Outcome: Progressing Goal: Cardiovascular complication will be avoided Outcome: Progressing   Problem: Elimination: Goal: Will not experience complications related to bowel motility Outcome: Progressing Goal: Will not experience complications related to urinary retention Outcome: Progressing   Problem: Safety: Goal: Ability to remain free from injury will improve Outcome: Progressing   Problem: Pain Managment: Goal: General experience of comfort will improve and/or be controlled Outcome: Progressing   Problem: Skin Integrity: Goal: Risk for impaired skin integrity will decrease Outcome: Progressing

## 2024-06-15 NOTE — TOC Transition Note (Signed)
 Transition of Care [facility]) - Discharge Note   Patient Details  Name: Ashley Willis MRN: [id number] Date of Birth: 06/25/1926  [facility] CM/SW Contact:  Roxie KANDICE Stain, RN Phone Number: 06/15/2024, 12:57 PM   Clinical Narrative:    [name] is stable to discharge home. Follow up apt on [facility]. No ICM (Inpatient Care Management) needs at this time.    Final next level of care: Home/Self Care Barriers to Discharge: Barriers Resolved   Patient Goals and CMS Choice Patient states their goals for this hospitalization and ongoing recovery are:: return home CMS Medicare.gov Compare Post Acute Care list provided to:: Patient Represenative (must comment) Choice offered to / list presented to : Adult Children      Discharge Placement               Home        Discharge Plan and Services Additional resources added to the After Visit Summary for                                       Social Drivers of Health (SDOH) Interventions SDOH Screenings   Food Insecurity: No Food Insecurity (04/14/2024)  Housing: Low Risk (04/14/2024)  Transportation Needs: No Transportation Needs (04/14/2024)  Utilities: Not At Risk (04/14/2024)  Social Connections: Moderately Isolated (04/14/2024)  Tobacco Use: Low Risk (06/14/2024)     Readmission Risk Interventions     No data to display

## 2024-06-15 NOTE — Discharge Summary (Signed)
 " Physician Discharge Summary   Patient: Ashley Willis MRN: [id number] DOB: 03-Jul-1926  Admit date:     06/13/2024  Discharge date: {dischdate:26783}  Discharge Physician: Ashley Willis   PCP: Ashley Cole, MD   Recommendations at discharge:  {Tip this will not be part of the note when signed- Example include specific recommendations for outpatient follow-up, pending tests to follow-up on. (Optional):26781}  ***  Discharge Diagnoses: Principal Problem:   Unresponsive episode Active Problems:   CORONARY ARTERY DISEASE   Memory loss  Resolved Problems:   * No resolved hospital problems. Ashley Willis Hospital Course: No notes on file  Assessment and Plan: No notes have been filed under this hospital service. Service: Hospitalist     {Tip this will not be part of the note when signed Body mass index is 21.48 kg/m. , ,  (Optional):26781}  {(NOTE) Pain control PDMP Statment (Optional):26782} Consultants: *** Procedures performed: ***  Disposition: {Plan; Disposition:26390} Diet recommendation:  {Diet_Plan:26776} DISCHARGE MEDICATION: Allergies as of 06/15/2024       Reactions   Codeine    Donepezil    Other Reaction(s): fatigue, agitation        Medication List     TAKE these medications    cetirizine 10 MG chewable tablet Commonly known as: ZYRTEC Chew 10 mg by mouth daily.   cholecalciferol 1000 units tablet Commonly known as: VITAMIN D Take 2,000 Units by mouth daily.        Discharge Exam: Filed Weights   06/13/24 1500 06/13/24 1610  Weight: 46.8 kg 49.9 kg   ***  Condition at discharge: {DC Condition:26389}  The results of significant diagnostics from this hospitalization (including imaging, microbiology, ancillary and laboratory) are listed below for reference.   Imaging Studies: ECHOCARDIOGRAM COMPLETE Result Date: 06/14/2024    ECHOCARDIOGRAM REPORT   Patient Name:   Ashley Willis Date of Exam: 06/14/2024 Medical Rec #:  [id number]      Height:        60.0 in Accession #:    [id number]     Weight:       110.0 lb Date of Birth:  [birth year]      BSA:          1.448 m Patient Age:    89 years       BP:           172/52 mmHg Patient Gender: F              HR:           71 bpm. Exam Location:  Inpatient Procedure: 2D Echo, Cardiac Doppler and Color Doppler (Both Spectral and Color            Flow Doppler were utilized during procedure). Indications:    Syncope R55  History:        Patient has prior history of Echocardiogram examinations, most                 recent 02/04/2021. CAD; Risk Factors:Hypertension and                 Dyslipidemia.  Sonographer:    Ashley Willis Referring Phys: [phone] Ashley Willis  Sonographer Comments: Image acquisition challenging due to uncooperative patient and Image acquisition challenging due to respiratory motion. IMPRESSIONS  1. Left ventricular ejection fraction, by estimation, is 55 to 60%. The left ventricle has normal function. The left ventricle has no regional wall motion abnormalities. Left ventricular diastolic parameters are consistent with Grade  I diastolic dysfunction (impaired relaxation).  2. Right ventricular systolic function is normal. The right ventricular size is normal.  3. The mitral valve is normal in structure. Trivial mitral valve regurgitation. No evidence of mitral stenosis.  4. The aortic valve is tricuspid. Aortic valve regurgitation is mild. No aortic stenosis is present.  5. The inferior vena cava is normal in size with greater than 50% respiratory variability, suggesting right atrial pressure of 3 mmHg. FINDINGS  Left Ventricle: Left ventricular ejection fraction, by estimation, is 55 to 60%. The left ventricle has normal function. The left ventricle has no regional wall motion abnormalities. The left ventricular internal cavity size was normal in size. There is  no left ventricular hypertrophy. Left ventricular diastolic parameters are consistent with Grade I diastolic dysfunction (impaired  relaxation). Right Ventricle: The right ventricular size is normal. Right ventricular systolic function is normal. Left Atrium: Left atrial size was normal in size. Right Atrium: Right atrial size was normal in size. Pericardium: There is no evidence of pericardial effusion. Mitral Valve: The mitral valve is normal in structure. Mild mitral annular calcification. Trivial mitral valve regurgitation. No evidence of mitral valve stenosis. Tricuspid Valve: The tricuspid valve is normal in structure. Tricuspid valve regurgitation is trivial. No evidence of tricuspid stenosis. Aortic Valve: The aortic valve is tricuspid. Aortic valve regurgitation is mild. No aortic stenosis is present. Pulmonic Valve: The pulmonic valve was normal in structure. Pulmonic valve regurgitation is not visualized. No evidence of pulmonic stenosis. Aorta: The aortic root is normal in size and structure. Venous: The inferior vena cava is normal in size with greater than 50% respiratory variability, suggesting right atrial pressure of 3 mmHg. IAS/Shunts: The interatrial septum is aneurysmal. No atrial level shunt detected by color flow Doppler.  LEFT VENTRICLE PLAX 2D LVIDd:         4.50 cm     Diastology LVIDs:         3.30 cm     LV e' medial:    5.33 cm/s LV PW:         1.00 cm     LV E/e' medial:  7.0 LV IVS:        1.00 cm     LV e' lateral:   6.42 cm/s LVOT diam:     1.80 cm     LV E/e' lateral: 5.8 LV SV:         35 LV SV Index:   24 LVOT Area:     2.54 cm  LV Volumes (MOD) LV vol d, MOD A2C: 56.9 ml LV vol d, MOD A4C: 49.1 ml LV vol s, MOD A2C: 31.8 ml LV vol s, MOD A4C: 27.4 ml LV SV MOD A2C:     25.1 ml LV SV MOD A4C:     49.1 ml LV SV MOD BP:      23.7 ml RIGHT VENTRICLE RV S prime:     15.10 cm/s TAPSE (M-mode): 1.7 cm LEFT ATRIUM             Index        RIGHT ATRIUM          Index LA diam:        2.30 cm 1.59 cm/m   RA Area:     6.58 cm LA Vol (A2C):   13.8 ml 9.53 ml/m   RA Volume:   7.90 ml  5.46 ml/m LA Vol (A4C):   14.7 ml  10.15 ml/m LA Biplane Vol: 15.0  ml 10.36 ml/m  AORTIC VALVE LVOT Vmax:   76.60 cm/s LVOT Vmean:  49.500 cm/s LVOT VTI:    0.136 m  AORTA Ao Root diam: 3.00 cm Ao Asc diam:  3.20 cm MITRAL VALVE MV Area (PHT): 1.97 cm     SHUNTS MV Decel Time: 386 msec     Systemic VTI:  0.14 m MV E velocity: 37.20 cm/s   Systemic Diam: 1.80 cm MV A velocity: 101.00 cm/s MV E/A ratio:  0.37 Redell Shallow MD Electronically signed by Redell Shallow MD Signature Date/Time: 06/14/2024/11:32:10 AM    Final    EEG adult Result Date: 06/14/2024 Shelton Arlin KIDD, MD     06/14/2024  5:32 PM Patient Name: Rosemaria Inabinet MRN: [id number] Epilepsy Attending: Arlin KIDD Shelton Referring Physician/Provider: Franky Ashley SAILOR, MD Date: 06/14/2024 Duration: 22.42 mins Patient history: 89yo F with an unresponsive episode. EEG to evaluate for seizure Level of alertness: Awake AEDs during EEG study: None Technical aspects: This EEG study was done with scalp electrodes positioned according to the 10-20 International system of electrode placement. Electrical activity was reviewed with band pass filter of 1-70Hz , sensitivity of 7 uV/mm, display speed of 38mm/sec with a 60Hz  notched filter applied as appropriate. EEG data were recorded continuously and digitally stored.  Video monitoring was available and reviewed as appropriate. Description: The posterior dominant rhythm consists of 7 Hz activity of moderate voltage (25-35 uV) seen predominantly in posterior head regions, symmetric and reactive to eye opening and eye closing. EEG showed continuous generalized 5 to 7 Hz theta slowing.  Hyperventilation and photic stimulation were not performed.    ABNORMALITY - Continuous slow, generalized - Background slow  IMPRESSION: This study is suggestive of generalized cerebral dysfunction (encephalopathy). No seizures or definite epileptiform discharges were seen throughout the recording.   Arlin KIDD Shelton   MR BRAIN WO CONTRAST Result Date:  06/13/2024 EXAM: MRI BRAIN WITHOUT CONTRAST 06/13/2024 05:18:00 PM TECHNIQUE: Multiplanar multisequence MRI of the head/brain was performed without the administration of intravenous contrast. COMPARISON: Same day CT head and CTA head and neck. CLINICAL HISTORY: Stroke, follow up. FINDINGS: BRAIN AND VENTRICLES: Scattered and confluent T2 and FLAIR hyperintensity in the periventricular and subcortical white matter with slight asymmetric predominance in the frontal lobes, suggestive of extensive chronic microvascular ischemic changes. There is moderate generalized parenchymal volume loss with slight temporal lobe predominance. Remote cortical infarcts in the bilateral frontal lobes. Chronic microhemorrhages in the right parietal lobe. No acute infarct. No acute intracranial hemorrhage. No mass. No midline shift. No hydrocephalus. The sella is unremarkable. Normal flow voids. ORBITS: Bilateral lens replacements. SINUSES AND MASTOIDS: No acute abnormality. BONES AND SOFT TISSUES: Normal marrow signal. No acute soft tissue abnormality. IMPRESSION: 1. No acute findings. 2. Remote cortical infarcts in the bilateral frontal lobes. 3. Chronic microhemorrhages in the right parietal lobe. 4. Extensive chronic microvascular ischemic changes. 5. Moderate generalized parenchymal volume loss with slight temporal lobe predominance. Electronically signed by: Donnice Mania MD 06/13/2024 06:32 PM EST RP Workstation: [id number]   DG Chest Portable 1 View Result Date: 06/13/2024 EXAM: 1 VIEW(S) XRAY OF THE CHEST 06/13/2024 04:14:00 PM COMPARISON: 04/14/2024 CLINICAL HISTORY: ams FINDINGS: LUNGS AND PLEURA: Low lung volumes. Coarse reticular opacities. New bibasilar patchy opacities. No pleural effusion. No pneumothorax. HEART AND MEDIASTINUM: Atherosclerotic calcifications. No acute abnormality of the cardiac and mediastinal silhouettes. BONES AND SOFT TISSUES: No acute osseous abnormality. IMPRESSION: 1. New bibasilar patchy  opacities favored to represent atelectasis related to low lung volumes. Infection  not excluded. Electronically signed by: Greig Pique MD 06/13/2024 05:16 PM EST RP Workstation: [id number]   CT ANGIO HEAD NECK W WO CM (CODE STROKE) Result Date: 06/13/2024 EXAM: CTA Head and Neck with Intravenous Contrast. CLINICAL HISTORY: Neuro deficit, acute, stroke suspected. TECHNIQUE: Axial CTA images of the head and neck performed without and with intravenous contrast. MIP reconstructed images were created and reviewed. Axial computed tomography images of the head/brain performed without intravenous contrast. Note: Per PQRS, the description of internal carotid artery percent stenosis, including 0 percent or normal exam, is based on [place] American Symptomatic Carotid Endarterectomy Trial (NASCET) criteria. Dose reduction technique was used including one or more of the following: automated exposure control, adjustment of mA and kV according to patient size, and/or iterative reconstruction. CONTRAST: Without and with; 75mL (iohexol  (OMNIPAQUE ) 350 MG/ML injection 75 mL IOHEXOL  350 MG/ML SOLN). COMPARISON: Same day CT head. FINDINGS: CTA NECK: COMMON CAROTID ARTERIES: Calcified atherosclerosis at the right carotid bifurcation without hemodynamically significant stenosis. Atherosclerosis at the left carotid bifurcation without hemodynamically significant stenosis. No dissection or occlusion. INTERNAL CAROTID ARTERIES: There is moderate tortuosity of the distal left cervical ICA. No stenosis by NASCET criteria. No dissection or occlusion. VERTEBRAL ARTERIES: The left vertebral artery is dominant. Atherosclerosis at the left vertebral artery origin without significant stenosis. Tortuosity of the left V1 segment. No dissection or occlusion. OTHER NECK VESSELS: Mild atherosclerosis of the aortic arch. Mild atherosclerosis of the bilateral subclavian arteries without significant stenosis. CTA HEAD: ANTERIOR CEREBRAL ARTERIES:  Diminutive appearance of the right A1 segment which may be secondary to atherosclerosis versus congenital. No significant stenosis. No occlusion. No aneurysm. MIDDLE CEREBRAL ARTERIES: No significant stenosis. No occlusion. No aneurysm. POSTERIOR CEREBRAL ARTERIES: No significant stenosis. No occlusion. No aneurysm. BASILAR ARTERY: No significant stenosis. No occlusion. No aneurysm. OTHER INTRACRANIAL VESSELS: There is mild atherosclerosis of the carotid siphons without significant stenosis. OTHER: LUNGS (visualized portions): There is a 0.5 cm nodule in the posterior right upper lobe. 0.3 cm nodule more anteriorly within the right upper lobe. SOFT TISSUES: No acute finding. No masses or lymphadenopathy. BONES: Degenerative changes in the visualized spine. Edentulous maxilla and mandible. No acute osseous abnormality. IMPRESSION: 1. No acute large vessel occlusion. 2. Mild atherosclerosis as above. No high-grade stenosis. 3. Right upper lobe nodules measuring up to 0.5 cm in diameter. Consider optional follow up CT chest in 12 months to document stability. Electronically signed by: Donnice Mania MD 06/13/2024 04:22 PM EST RP Workstation: [id number]   CT HEAD CODE STROKE WO CONTRAST Addendum Date: 06/13/2024 ******** ADDENDUM #1 ******** ADDENDUM: Findings discussed with Dr. Michaela at 3:55 PM on 06/13/24. ---------------------------------------------------- Electronically signed by: Donnice Mania MD 06/13/2024 03:59 PM EST RP Workstation: [id number]   Result Date: 06/13/2024 ******** ORIGINAL REPORT ******** EXAM: CT HEAD WITHOUT 06/13/2024 03:34:06 PM TECHNIQUE: CT of the head was performed without the administration of intravenous contrast. Automated exposure control, iterative reconstruction, and/or weight based adjustment of the mA/kV was utilized to reduce the radiation dose to as low as reasonably achievable. COMPARISON: 04/14/2024 CLINICAL HISTORY: Neuro deficit, acute, stroke suspected FINDINGS:  BRAIN AND VENTRICLES: No acute intracranial hemorrhage. No mass effect or midline shift. No extra-axial fluid collection. No evidence of acute infarct. No hydrocephalus. Severe chronic microvascular ischemic change. Moderate generalized parenchymal volume loss with slight temporal lobe predominance. Atherosclerosis at the skull base. [place] Stroke Program Early CT (ASPECT) score: Ganglionic (caudate, IC, lentiform nucleus, insula, M1-M3): 7. Supraganglionic (M4-M6): 3. Total: 10. ORBITS: No acute abnormality. Bilateral  lens replacement. SINUSES AND MASTOIDS: No acute abnormality. SOFT TISSUES AND SKULL: No acute skull fracture. No acute soft tissue abnormality. IMPRESSION: 1. No acute intracranial abnormality. 2. ASPECTS of 10. 3. Severe chronic microvascular ischemic change. Moderate cerebral volume loss with temporal predominance. Electronically signed by: Donnice Mania MD 06/13/2024 03:51 PM EST RP Workstation: [id number]    Microbiology: Results for orders placed or performed during the hospital encounter of 04/14/24  Blood culture (routine x 2)     Status: None   Collection Time: 04/14/24  6:12 AM   Specimen: BLOOD  Result Value Ref Range Status   Specimen Description   Final    BLOOD RIGHT ANTECUBITAL Performed at [facility], [address] [place], [place] [address]    Special Requests   Final    BOTTLES DRAWN AEROBIC AND ANAEROBIC Blood Culture adequate volume Performed at [facility], [address], [place], [place] [address]    Culture   Final    NO GROWTH 5 DAYS Performed at [facility], [address] [place], [place] [address]    Report Status 04/19/2024 FINAL  Final  Blood culture (routine x 2)     Status: None   Collection Time: 04/14/24  6:20 AM   Specimen: BLOOD  Result Value Ref Range Status   Specimen Description   Final    BLOOD BLOOD RIGHT FOREARM Performed at [facility], [address],  [place], [place] [address]    Special Requests   Final    BOTTLES DRAWN AEROBIC ONLY Blood Culture adequate volume Performed at [facility], [address], [place], [place] [address]    Culture   Final    NO GROWTH 5 DAYS Performed at [facility], [address], [place], [place] [address]    Report Status 04/19/2024 FINAL  Final  Urine Culture     Status: Abnormal   Collection Time: 04/14/24  7:29 AM   Specimen: Urine, Clean Catch  Result Value Ref Range Status   Specimen Description   Final    URINE, CLEAN CATCH Performed at [facility], [address] [place], [place] [address]    Special Requests   Final    NONE Performed at [facility], [address], [place], [place] [address]    Culture >=100,000 COLONIES/mL ESCHERICHIA COLI (A)  Final   Report Status 04/16/2024 FINAL  Final   Organism ID, Bacteria ESCHERICHIA COLI (A)  Final      Susceptibility   Escherichia coli - MIC*    AMPICILLIN RESISTANT Resistant     CEFAZOLIN (URINE) Value in next row Sensitive      2 SENSITIVEThis is a modified FDA-approved test that has been validated and its performance characteristics determined by the reporting laboratory.  This laboratory is certified under the Clinical Laboratory Improvement Amendments CLIA as qualified to perform high complexity clinical laboratory testing.    CEFEPIME Value in next row Sensitive      2 SENSITIVEThis is a modified FDA-approved test that has been validated and its performance characteristics determined by the reporting laboratory.  This laboratory is certified under the Clinical Laboratory Improvement Amendments CLIA as qualified to perform high complexity clinical laboratory testing.    ERTAPENEM Value in next row Sensitive      2 SENSITIVEThis is a modified FDA-approved test that has been validated and its performance characteristics determined by the reporting laboratory.  This laboratory  is  certified under the Clinical Laboratory Improvement Amendments CLIA as qualified to perform high complexity clinical laboratory testing.    CEFTRIAXONE  Value in next row Sensitive      2 SENSITIVEThis is a modified FDA-approved test that has been validated and its performance characteristics determined by the reporting laboratory.  This laboratory is certified under the Clinical Laboratory Improvement Amendments CLIA as qualified to perform high complexity clinical laboratory testing.    CIPROFLOXACIN Value in next row Sensitive      2 SENSITIVEThis is a modified FDA-approved test that has been validated and its performance characteristics determined by the reporting laboratory.  This laboratory is certified under the Clinical Laboratory Improvement Amendments CLIA as qualified to perform high complexity clinical laboratory testing.    GENTAMICIN Value in next row Sensitive      2 SENSITIVEThis is a modified FDA-approved test that has been validated and its performance characteristics determined by the reporting laboratory.  This laboratory is certified under the Clinical Laboratory Improvement Amendments CLIA as qualified to perform high complexity clinical laboratory testing.    NITROFURANTOIN Value in next row Sensitive      2 SENSITIVEThis is a modified FDA-approved test that has been validated and its performance characteristics determined by the reporting laboratory.  This laboratory is certified under the Clinical Laboratory Improvement Amendments CLIA as qualified to perform high complexity clinical laboratory testing.    TRIMETH/SULFA Value in next row Sensitive      2 SENSITIVEThis is a modified FDA-approved test that has been validated and its performance characteristics determined by the reporting laboratory.  This laboratory is certified under the Clinical Laboratory Improvement Amendments CLIA as qualified to perform high complexity clinical laboratory testing.    AMPICILLIN/SULBACTAM Value  in next row Sensitive      2 SENSITIVEThis is a modified FDA-approved test that has been validated and its performance characteristics determined by the reporting laboratory.  This laboratory is certified under the Clinical Laboratory Improvement Amendments CLIA as qualified to perform high complexity clinical laboratory testing.    PIP/TAZO Value in next row Intermediate      32 INTERMEDIATEThis is a modified FDA-approved test that has been validated and its performance characteristics determined by the reporting laboratory.  This laboratory is certified under the Clinical Laboratory Improvement Amendments CLIA as qualified to perform high complexity clinical laboratory testing.    MEROPENEM Value in next row Sensitive      32 INTERMEDIATEThis is a modified FDA-approved test that has been validated and its performance characteristics determined by the reporting laboratory.  This laboratory is certified under the Clinical Laboratory Improvement Amendments CLIA as qualified to perform high complexity clinical laboratory testing.    * >=100,000 COLONIES/mL ESCHERICHIA COLI  Resp panel by RT-PCR (RSV, Flu A&B, Covid) Anterior Nasal Swab     Status: None   Collection Time: 04/14/24 10:36 AM   Specimen: Anterior Nasal Swab  Result Value Ref Range Status   SARS Coronavirus 2 by RT PCR NEGATIVE NEGATIVE Final    Comment: (NOTE) SARS-CoV-2 target nucleic acids are NOT DETECTED.  The SARS-CoV-2 RNA is generally detectable in upper respiratory specimens during the acute phase of infection. The lowest concentration of SARS-CoV-2 viral copies this assay can detect is 138 copies/mL. A negative result does not preclude SARS-Cov-2 infection and should not be used as the sole basis for treatment or other patient management decisions. A negative result may occur with  improper specimen collection/handling, submission of specimen other than nasopharyngeal swab,  presence of viral mutation(s) within the areas  targeted by this assay, and inadequate number of viral copies(<138 copies/mL). A negative result must be combined with clinical observations, patient history, and epidemiological information. The expected result is Negative.  Fact Sheet for Patients:  bloggercourse.com  Fact Sheet for Healthcare Providers:  seriousbroker.it  This test is no t yet approved or cleared by the [place]  FDA and  has been authorized for detection and/or diagnosis of SARS-CoV-2 by FDA under an Emergency Use Authorization (EUA). This EUA will remain  in effect (meaning this test can be used) for the duration of the COVID-19 declaration under Section 564(b)(1) of the Act, 21 U.S.C.section 360bbb-3(b)(1), unless the authorization is terminated  or revoked sooner.       Influenza A by PCR NEGATIVE NEGATIVE Final   Influenza B by PCR NEGATIVE NEGATIVE Final    Comment: (NOTE) The Xpert Xpress SARS-CoV-2/FLU/RSV plus assay is intended as an aid in the diagnosis of influenza from Nasopharyngeal swab specimens and should not be used as a sole basis for treatment. Nasal washings and aspirates are unacceptable for Xpert Xpress SARS-CoV-2/FLU/RSV testing.  Fact Sheet for Patients: bloggercourse.com  Fact Sheet for Healthcare Providers: seriousbroker.it  This test is not yet approved or cleared by the [place]  FDA and has been authorized for detection and/or diagnosis of SARS-CoV-2 by FDA under an Emergency Use Authorization (EUA). This EUA will remain in effect (meaning this test can be used) for the duration of the COVID-19 declaration under Section 564(b)(1) of the Act, 21 U.S.C. section 360bbb-3(b)(1), unless the authorization is terminated or revoked.     Resp Syncytial Virus by PCR NEGATIVE NEGATIVE Final    Comment: (NOTE) Fact Sheet for  Patients: bloggercourse.com  Fact Sheet for Healthcare Providers: seriousbroker.it  This test is not yet approved or cleared by the [place]  FDA and has been authorized for detection and/or diagnosis of SARS-CoV-2 by FDA under an Emergency Use Authorization (EUA). This EUA will remain in effect (meaning this test can be used) for the duration of the COVID-19 declaration under Section 564(b)(1) of the Act, 21 U.S.C. section 360bbb-3(b)(1), unless the authorization is terminated or revoked.  Performed at [facility], [address], [place], [place] [address]     Labs: CBC: Recent Labs  Lab 06/13/24 1534 06/13/24 1732 06/14/24 0409  WBC  --  5.1 4.5  NEUTROABS  --  2.0  --   HGB 15.3* 13.1 13.3  HCT 45.0 41.7 42.2  MCV  --  87.6 88.1  PLT  --  187 194   Basic Metabolic Panel: Recent Labs  Lab 06/13/24 1534 06/13/24 1732 06/14/24 0409 06/14/24 1826  NA 142 136 137  --   K 3.9 3.8 4.2  --   CL 100 101 102  --   CO2  --  26 26  --   GLUCOSE 72 77 85  --   BUN 13 12 11   --   CREATININE 0.70 0.71 0.72 0.71  CALCIUM   --  9.2 9.3  --    Liver Function Tests: Recent Labs  Lab 06/13/24 1732  AST 29  ALT 13  ALKPHOS 61  BILITOT 0.4  PROT 8.3*  ALBUMIN 3.7   CBG: Recent Labs  Lab 06/13/24 1528 06/14/24 1057 06/14/24 1428  GLUCAP 66* 123* 86    Discharge time spent: {LESS THAN/GREATER THAN:26388} 30 minutes.  Signed: Casimer Dare, MD [facility] 06/15/2024 "

## 2024-06-19 ENCOUNTER — Ambulatory Visit (INDEPENDENT_AMBULATORY_CARE_PROVIDER_SITE_OTHER): Admitting: Otolaryngology

## 2024-06-26 ENCOUNTER — Ambulatory Visit (INDEPENDENT_AMBULATORY_CARE_PROVIDER_SITE_OTHER): Admitting: Otolaryngology

## 2024-06-26 ENCOUNTER — Encounter (INDEPENDENT_AMBULATORY_CARE_PROVIDER_SITE_OTHER): Payer: Self-pay | Admitting: Otolaryngology

## 2024-06-26 VITALS — HR 78

## 2024-06-26 DIAGNOSIS — H6123 Impacted cerumen, bilateral: Secondary | ICD-10-CM | POA: Diagnosis not present

## 2024-06-26 NOTE — Progress Notes (Signed)
 Patient ID: Ashley Willis, female   DOB: 05/04/1927, 89 y.o.   MRN: [id number]  Procedure: Bilateral cerumen disimpaction.   Indication: Cerumen impaction, resulting in ear discomfort and conductive hearing loss.   Description: The patient is placed supine on the operating table. Under the operating microscope, the right ear canal is examined and is noted to be impacted with cerumen. The cerumen is carefully removed with a combination of suction catheters, cerumen curette, and alligator forceps. After the cerumen removal, the ear canal and tympanic membrane are noted to be normal. No middle ear effusion is noted. The same procedure is then repeated on the left side without exception. The patient tolerated the procedure well.  Follow-up care:  The patient will follow up in 6 months.

## 2024-06-28 NOTE — Progress Notes (Signed)
 ok

## 2024-06-29 NOTE — Progress Notes (Signed)
 ok

## 2024-08-28 ENCOUNTER — Ambulatory Visit: Admitting: Podiatry

## 2024-12-18 ENCOUNTER — Ambulatory Visit (INDEPENDENT_AMBULATORY_CARE_PROVIDER_SITE_OTHER): Admitting: Otolaryngology
